# Patient Record
Sex: Male | Born: 1960 | Race: White | Hispanic: No | Marital: Married | Smoking: Current every day smoker
Health system: Southern US, Community
[De-identification: ages and names within clinical notes are randomized; demographics above are authoritative.]

## PROBLEM LIST (undated history)

## (undated) DIAGNOSIS — C3491 Malignant neoplasm of unspecified part of right bronchus or lung: Secondary | ICD-10-CM

## (undated) DIAGNOSIS — C7951 Secondary malignant neoplasm of bone: Secondary | ICD-10-CM

## (undated) DIAGNOSIS — C7931 Secondary malignant neoplasm of brain: Secondary | ICD-10-CM

## (undated) DIAGNOSIS — K529 Noninfective gastroenteritis and colitis, unspecified: Secondary | ICD-10-CM

## (undated) DIAGNOSIS — K7460 Unspecified cirrhosis of liver: Secondary | ICD-10-CM

## (undated) HISTORY — DX: Noninfective gastroenteritis and colitis, unspecified: K52.9

## (undated) HISTORY — PX: OTHER SURGICAL HISTORY: SHX169

## (undated) HISTORY — DX: Secondary malignant neoplasm of brain: C79.31

## (undated) HISTORY — DX: Secondary malignant neoplasm of bone: C79.51

## (undated) HISTORY — DX: Malignant neoplasm of unspecified part of right bronchus or lung: C34.91

## (undated) HISTORY — DX: Unspecified cirrhosis of liver: K74.60

---

## 2009-12-12 ENCOUNTER — Ambulatory Visit (HOSPITAL_COMMUNITY): Admission: RE | Admit: 2009-12-12 | Discharge: 2009-12-12 | Payer: Self-pay | Admitting: Internal Medicine

## 2014-11-19 ENCOUNTER — Other Ambulatory Visit (HOSPITAL_COMMUNITY): Payer: Self-pay | Admitting: Oncology

## 2014-11-19 DIAGNOSIS — R634 Abnormal weight loss: Secondary | ICD-10-CM | POA: Insufficient documentation

## 2014-11-19 DIAGNOSIS — Z8781 Personal history of (healed) traumatic fracture: Secondary | ICD-10-CM | POA: Insufficient documentation

## 2014-11-19 DIAGNOSIS — F17210 Nicotine dependence, cigarettes, uncomplicated: Secondary | ICD-10-CM | POA: Insufficient documentation

## 2014-11-19 DIAGNOSIS — R918 Other nonspecific abnormal finding of lung field: Secondary | ICD-10-CM | POA: Insufficient documentation

## 2014-11-29 ENCOUNTER — Encounter (HOSPITAL_COMMUNITY)
Admission: RE | Admit: 2014-11-29 | Discharge: 2014-11-29 | Disposition: A | Discharge status: Home / Self Care | Payer: BLUE CROSS/BLUE SHIELD | Source: Ambulatory Visit | Attending: Oncology | Admitting: Oncology

## 2014-11-29 DIAGNOSIS — R634 Abnormal weight loss: Secondary | ICD-10-CM | POA: Insufficient documentation

## 2014-11-29 DIAGNOSIS — R918 Other nonspecific abnormal finding of lung field: Secondary | ICD-10-CM | POA: Insufficient documentation

## 2014-11-29 LAB — GLUCOSE, CAPILLARY: Glucose-Capillary: 98 mg/dL (ref 65–99)

## 2014-11-29 MED ORDER — FLUDEOXYGLUCOSE F - 18 (FDG) INJECTION
11.0000 | Freq: Once | INTRAVENOUS | Status: DC | PRN
  Administered 2014-11-29: 11 via INTRAVENOUS
  Filled 2014-11-29: qty 11

## 2014-12-03 DIAGNOSIS — C3431 Malignant neoplasm of lower lobe, right bronchus or lung: Secondary | ICD-10-CM | POA: Insufficient documentation

## 2014-12-31 ENCOUNTER — Other Ambulatory Visit (HOSPITAL_COMMUNITY): Payer: Self-pay | Admitting: Oncology

## 2014-12-31 DIAGNOSIS — C3431 Malignant neoplasm of lower lobe, right bronchus or lung: Secondary | ICD-10-CM

## 2015-02-11 ENCOUNTER — Encounter (HOSPITAL_COMMUNITY)
Admission: RE | Admit: 2015-02-11 | Discharge: 2015-02-11 | Disposition: A | Discharge status: Home / Self Care | Payer: BLUE CROSS/BLUE SHIELD | Source: Ambulatory Visit | Attending: Oncology | Admitting: Oncology

## 2015-02-11 DIAGNOSIS — C3431 Malignant neoplasm of lower lobe, right bronchus or lung: Secondary | ICD-10-CM | POA: Diagnosis not present

## 2015-02-11 LAB — GLUCOSE, CAPILLARY: GLUCOSE-CAPILLARY: 100 mg/dL — AB (ref 65–99)

## 2015-02-11 MED ORDER — FLUDEOXYGLUCOSE F - 18 (FDG) INJECTION
10.9100 | Freq: Once | INTRAVENOUS | Status: AC | PRN
  Administered 2015-02-11: 10.91 via INTRAVENOUS

## 2015-02-12 DIAGNOSIS — Z5111 Encounter for antineoplastic chemotherapy: Secondary | ICD-10-CM | POA: Insufficient documentation

## 2015-03-12 ENCOUNTER — Other Ambulatory Visit (HOSPITAL_COMMUNITY): Payer: Self-pay | Admitting: Oncology

## 2015-03-12 DIAGNOSIS — C3431 Malignant neoplasm of lower lobe, right bronchus or lung: Secondary | ICD-10-CM

## 2015-04-05 ENCOUNTER — Encounter (HOSPITAL_COMMUNITY)
Admission: RE | Admit: 2015-04-05 | Discharge: 2015-04-05 | Disposition: A | Discharge status: Home / Self Care | Payer: BLUE CROSS/BLUE SHIELD | Source: Ambulatory Visit | Attending: Oncology | Admitting: Oncology

## 2015-04-05 DIAGNOSIS — C3431 Malignant neoplasm of lower lobe, right bronchus or lung: Secondary | ICD-10-CM | POA: Insufficient documentation

## 2015-04-05 LAB — GLUCOSE, CAPILLARY: Glucose-Capillary: 94 mg/dL (ref 65–99)

## 2015-04-05 MED ORDER — FLUDEOXYGLUCOSE F - 18 (FDG) INJECTION
11.5000 | Freq: Once | INTRAVENOUS | Status: AC | PRN
  Administered 2015-04-05: 11.5 via INTRAVENOUS

## 2015-05-21 DIAGNOSIS — J449 Chronic obstructive pulmonary disease, unspecified: Secondary | ICD-10-CM | POA: Insufficient documentation

## 2015-07-11 ENCOUNTER — Encounter (HOSPITAL_COMMUNITY): Payer: Self-pay | Admitting: Oncology

## 2015-07-11 ENCOUNTER — Other Ambulatory Visit (HOSPITAL_COMMUNITY): Payer: Self-pay | Admitting: Oncology

## 2015-07-11 DIAGNOSIS — C3491 Malignant neoplasm of unspecified part of right bronchus or lung: Secondary | ICD-10-CM

## 2015-07-11 HISTORY — DX: Malignant neoplasm of unspecified part of right bronchus or lung: C34.91

## 2015-08-01 ENCOUNTER — Ambulatory Visit (HOSPITAL_COMMUNITY): Payer: BLUE CROSS/BLUE SHIELD

## 2015-08-01 ENCOUNTER — Encounter (HOSPITAL_COMMUNITY): Payer: BLUE CROSS/BLUE SHIELD | Attending: Hematology & Oncology

## 2015-08-01 ENCOUNTER — Encounter (HOSPITAL_COMMUNITY): Payer: Self-pay

## 2015-08-01 VITALS — BP 145/78 | HR 95 | Temp 97.0°F | Resp 20 | Ht 68.0 in | Wt 251.0 lb

## 2015-08-01 DIAGNOSIS — C3491 Malignant neoplasm of unspecified part of right bronchus or lung: Secondary | ICD-10-CM

## 2015-08-01 DIAGNOSIS — Z5111 Encounter for antineoplastic chemotherapy: Secondary | ICD-10-CM | POA: Diagnosis not present

## 2015-08-01 LAB — CBC WITH DIFFERENTIAL/PLATELET
Basophils Absolute: 0 10*3/uL (ref 0.0–0.1)
Basophils Relative: 0 %
EOS PCT: 0 %
Eosinophils Absolute: 0 10*3/uL (ref 0.0–0.7)
HEMATOCRIT: 42.5 % (ref 39.0–52.0)
HEMOGLOBIN: 14.9 g/dL (ref 13.0–17.0)
LYMPHS ABS: 1.2 10*3/uL (ref 0.7–4.0)
LYMPHS PCT: 15 %
MCH: 35.6 pg — AB (ref 26.0–34.0)
MCHC: 35.1 g/dL (ref 30.0–36.0)
MCV: 101.7 fL — AB (ref 78.0–100.0)
MONO ABS: 0.3 10*3/uL (ref 0.1–1.0)
MONOS PCT: 3 %
NEUTROS ABS: 6.8 10*3/uL (ref 1.7–7.7)
Neutrophils Relative %: 82 %
Platelets: 480 10*3/uL — ABNORMAL HIGH (ref 150–400)
RBC: 4.18 MIL/uL — ABNORMAL LOW (ref 4.22–5.81)
RDW: 13 % (ref 11.5–15.5)
WBC: 8.3 10*3/uL (ref 4.0–10.5)

## 2015-08-01 LAB — COMPREHENSIVE METABOLIC PANEL
ALBUMIN: 3.9 g/dL (ref 3.5–5.0)
ALK PHOS: 72 U/L (ref 38–126)
ALT: 19 U/L (ref 17–63)
ANION GAP: 10 (ref 5–15)
AST: 22 U/L (ref 15–41)
BILIRUBIN TOTAL: 0.6 mg/dL (ref 0.3–1.2)
BUN: 9 mg/dL (ref 6–20)
CALCIUM: 9.5 mg/dL (ref 8.9–10.3)
CO2: 27 mmol/L (ref 22–32)
Chloride: 101 mmol/L (ref 101–111)
Creatinine, Ser: 0.9 mg/dL (ref 0.61–1.24)
GLUCOSE: 147 mg/dL — AB (ref 65–99)
Potassium: 4.4 mmol/L (ref 3.5–5.1)
Sodium: 138 mmol/L (ref 135–145)
TOTAL PROTEIN: 8.2 g/dL — AB (ref 6.5–8.1)

## 2015-08-01 MED ORDER — SODIUM CHLORIDE 0.9% FLUSH
10.0000 mL | INTRAVENOUS | Status: DC | PRN
Start: 2015-08-01 — End: 2015-08-01

## 2015-08-01 MED ORDER — SODIUM CHLORIDE 0.9 % IV SOLN
500.0000 mg/m2 | Freq: Once | INTRAVENOUS | Status: AC
  Administered 2015-08-01: 1100 mg via INTRAVENOUS
  Filled 2015-08-01: qty 40

## 2015-08-01 MED ORDER — SODIUM CHLORIDE 0.9 % IV SOLN
Freq: Once | INTRAVENOUS | Status: AC
  Administered 2015-08-01: 15:00:00 via INTRAVENOUS
  Filled 2015-08-01: qty 4

## 2015-08-01 MED ORDER — SODIUM CHLORIDE 0.9 % IV SOLN
Freq: Once | INTRAVENOUS | Status: AC
  Administered 2015-08-01: 15:00:00 via INTRAVENOUS

## 2015-08-01 MED ORDER — HEPARIN SOD (PORK) LOCK FLUSH 100 UNIT/ML IV SOLN
500.0000 [IU] | Freq: Once | INTRAVENOUS | Status: AC | PRN
  Administered 2015-08-01: 500 [IU]
  Filled 2015-08-01: qty 5

## 2015-08-01 MED ORDER — CYANOCOBALAMIN 1000 MCG/ML IJ SOLN
1000.0000 ug | Freq: Once | INTRAMUSCULAR | Status: AC
  Administered 2015-08-01: 1000 ug via INTRAMUSCULAR
  Filled 2015-08-01: qty 1

## 2015-08-01 NOTE — Patient Instructions (Signed)
[facility] Discharge Instructions for Patients Receiving Chemotherapy   Beginning January 23rd 2017 lab work for the [facility] will be done in the  Main lab at [facility] on 1st floor. If you have a lab appointment with the [facility] please come in thru the  Main Entrance and check in at the main information desk   Today you received the following chemotherapy agents:  Alimta  If you develop nausea and vomiting, or diarrhea that is not controlled by your medication, call the clinic.  The clinic phone number is [phone]. Office hours are Monday-Friday 8:30am-5:00pm.  BELOW ARE SYMPTOMS THAT SHOULD BE REPORTED IMMEDIATELY:  *FEVER GREATER THAN 101.0 F  *CHILLS WITH OR WITHOUT FEVER  NAUSEA AND VOMITING THAT IS NOT CONTROLLED WITH YOUR NAUSEA MEDICATION  *UNUSUAL SHORTNESS OF BREATH  *UNUSUAL BRUISING OR BLEEDING  TENDERNESS IN MOUTH AND THROAT WITH OR WITHOUT PRESENCE OF ULCERS  *URINARY PROBLEMS  *BOWEL PROBLEMS  UNUSUAL RASH Items with * indicate a potential emergency and should be followed up as soon as possible. If you have an emergency after office hours please contact your primary care physician or go to the nearest emergency department.  Please call the clinic during office hours if you have any questions or concerns.   You may also contact the Patient Navigator at [phone] should you have any questions or need assistance in obtaining follow up care.      Resources For Cancer Patients and their Caregivers ? American Cancer Society: Can assist with transportation, wigs, general needs, runs Look Good Feel Better.        ([phone] ? Cancer Care: Provides financial assistance, online support groups, medication/co-pay assistance.  1-800-813-HOPE 832 048 6022) ? [place] Assists [place] cancer patients and their families through emotional , educational and financial support.   931-877-7160 ? [place] DSS Where to apply for food stamps, Medicaid and utility assistance. 970-744-0568 ? RCATS: Transportation to medical appointments. 973 886 1702 ? Social Security Administration: May apply for disability if have a Stage IV cancer. [phone] ? [employer], Disability and Transit Services: Assists with nutrition, care and transit needs. [phone]

## 2015-08-01 NOTE — Progress Notes (Signed)
Tolerated tx w/o adverse reaction.  A&Ox4, in no distress.  VSS.  Discharged ambulatory.

## 2015-08-14 ENCOUNTER — Encounter (HOSPITAL_COMMUNITY): Payer: Self-pay | Admitting: Oncology

## 2015-08-14 ENCOUNTER — Encounter (HOSPITAL_COMMUNITY)
Admission: RE | Admit: 2015-08-14 | Discharge: 2015-08-14 | Disposition: A | Discharge status: Home / Self Care | Payer: BLUE CROSS/BLUE SHIELD | Source: Ambulatory Visit | Attending: Oncology | Admitting: Oncology

## 2015-08-14 DIAGNOSIS — K7460 Unspecified cirrhosis of liver: Secondary | ICD-10-CM

## 2015-08-14 DIAGNOSIS — C3491 Malignant neoplasm of unspecified part of right bronchus or lung: Secondary | ICD-10-CM | POA: Diagnosis not present

## 2015-08-14 HISTORY — DX: Unspecified cirrhosis of liver: K74.60

## 2015-08-14 LAB — GLUCOSE, CAPILLARY: GLUCOSE-CAPILLARY: 112 mg/dL — AB (ref 65–99)

## 2015-08-14 MED ORDER — FLUDEOXYGLUCOSE F - 18 (FDG) INJECTION
12.5700 | Freq: Once | INTRAVENOUS | Status: AC | PRN
  Administered 2015-08-14: 12.57 via INTRAVENOUS

## 2015-08-22 ENCOUNTER — Encounter (HOSPITAL_BASED_OUTPATIENT_CLINIC_OR_DEPARTMENT_OTHER): Payer: BLUE CROSS/BLUE SHIELD

## 2015-08-22 ENCOUNTER — Ambulatory Visit (HOSPITAL_COMMUNITY): Payer: BLUE CROSS/BLUE SHIELD

## 2015-08-22 ENCOUNTER — Encounter (HOSPITAL_COMMUNITY): Payer: Self-pay | Admitting: Oncology

## 2015-08-22 ENCOUNTER — Encounter (HOSPITAL_BASED_OUTPATIENT_CLINIC_OR_DEPARTMENT_OTHER): Payer: BLUE CROSS/BLUE SHIELD | Admitting: Oncology

## 2015-08-22 ENCOUNTER — Ambulatory Visit (HOSPITAL_COMMUNITY): Payer: BLUE CROSS/BLUE SHIELD | Admitting: Hematology & Oncology

## 2015-08-22 VITALS — BP 127/94 | HR 101 | Temp 97.6°F | Resp 18 | Wt 250.8 lb

## 2015-08-22 DIAGNOSIS — R42 Dizziness and giddiness: Secondary | ICD-10-CM

## 2015-08-22 DIAGNOSIS — C3491 Malignant neoplasm of unspecified part of right bronchus or lung: Secondary | ICD-10-CM | POA: Diagnosis not present

## 2015-08-22 DIAGNOSIS — Z7189 Other specified counseling: Secondary | ICD-10-CM

## 2015-08-22 DIAGNOSIS — Z720 Tobacco use: Secondary | ICD-10-CM

## 2015-08-22 DIAGNOSIS — C7951 Secondary malignant neoplasm of bone: Secondary | ICD-10-CM | POA: Diagnosis not present

## 2015-08-22 LAB — COMPREHENSIVE METABOLIC PANEL
ALBUMIN: 3.8 g/dL (ref 3.5–5.0)
ALK PHOS: 75 U/L (ref 38–126)
ALT: 18 U/L (ref 17–63)
AST: 20 U/L (ref 15–41)
Anion gap: 12 (ref 5–15)
BUN: 11 mg/dL (ref 6–20)
CALCIUM: 9.4 mg/dL (ref 8.9–10.3)
CHLORIDE: 100 mmol/L — AB (ref 101–111)
CO2: 24 mmol/L (ref 22–32)
CREATININE: 1.07 mg/dL (ref 0.61–1.24)
GFR calc Af Amer: >60 mL/min (ref >60)
GFR calc non Af Amer: >60 mL/min (ref >60)
GLUCOSE: 191 mg/dL — AB (ref 65–99)
Potassium: 3.7 mmol/L (ref 3.5–5.1)
SODIUM: 136 mmol/L (ref 135–145)
Total Bilirubin: 0.4 mg/dL (ref 0.3–1.2)
Total Protein: 8.1 g/dL (ref 6.5–8.1)

## 2015-08-22 LAB — CBC WITH DIFFERENTIAL/PLATELET
BASOS ABS: 0 10*3/uL (ref 0.0–0.1)
BASOS PCT: 0 %
EOS ABS: 0 10*3/uL (ref 0.0–0.7)
Eosinophils Relative: 0 %
HCT: 43.2 % (ref 39.0–52.0)
HEMOGLOBIN: 14.4 g/dL (ref 13.0–17.0)
LYMPHS ABS: 2.3 10*3/uL (ref 0.7–4.0)
Lymphocytes Relative: 13 %
MCH: 33.1 pg (ref 26.0–34.0)
MCHC: 33.3 g/dL (ref 30.0–36.0)
MCV: 99.3 fL (ref 78.0–100.0)
Monocytes Absolute: 1.1 10*3/uL — ABNORMAL HIGH (ref 0.1–1.0)
Monocytes Relative: 7 %
NEUTROS PCT: 80 %
Neutro Abs: 13.9 10*3/uL — ABNORMAL HIGH (ref 1.7–7.7)
Platelets: 588 10*3/uL — ABNORMAL HIGH (ref 150–400)
RBC: 4.35 MIL/uL (ref 4.22–5.81)
RDW: 13.1 % (ref 11.5–15.5)
WBC: 17.3 10*3/uL — AB (ref 4.0–10.5)

## 2015-08-22 MED ORDER — SODIUM CHLORIDE 0.9% FLUSH
10.0000 mL | INTRAVENOUS | Status: DC | PRN
  Administered 2015-08-22: 10 mL via INTRAVENOUS
  Filled 2015-08-22: qty 10

## 2015-08-22 MED ORDER — HEPARIN SOD (PORK) LOCK FLUSH 100 UNIT/ML IV SOLN
INTRAVENOUS | Status: AC
  Filled 2015-08-22: qty 5

## 2015-08-22 MED ORDER — HEPARIN SOD (PORK) LOCK FLUSH 100 UNIT/ML IV SOLN
500.0000 [IU] | Freq: Once | INTRAVENOUS | Status: AC
  Administered 2015-08-22: 500 [IU] via INTRAVENOUS

## 2015-08-22 NOTE — Patient Instructions (Addendum)
[place] (nivolumab) for the treatment of lung cancer: is a prescription medicine used to treat a type of advanced stage lung cancer (non-small cell lung cancer) that has spread or grown and you have tried chemotherapy that contains platinum, and it did not work or is no longer working.  This takes 1 hour to infuse. This will be given every 2 weeks.  The most common side effects of OPDIVO in people with non-small cell lung cancer include: feeling tired; pain in muscles, bones, and joints; decreased appetite; cough; constipation; shortness of breath; and nausea.     EDUCATIONAL MATERIALS GIVEN AND REVIEWED: Chemotherapy and you book, opdivo information     SELF CARE ACTIVITIES WHILE ON CHEMOTHERAPY:   Increase your fluid intake 48 hours prior to treatment and drink at least 2 quarts (64 oz of water/decaff beverages) per day after treatment. No alcohol intake. No aspirin or other medications unless approved by your oncologist. Eat foods that are light and easy to digest. Eat foods at cold or room temperature (as long as you aren't on the drug Oxaliplatin). No fried, fatty, or spicy foods immediately before or after treatment. Have teeth cleaned professionally before starting treatment. Keep dentures and partial plates clean. Use soft toothbrush and do not use mouthwashes that contain alcohol. Biotene is a good mouthwash that is available at most pharmacies or may be ordered by calling [phone]. Use warm salt water gargles (1 teaspoon salt per 1 quart warm water) before and after meals and at bedtime. Or you may rinse with 2 tablespoons of three-percent hydrogen peroxide mixed in eight ounces of water. Always use sunscreen that has not expired and with SPF (Sun Protection Factor) of 50 or higher. Wear hats to protect your head from the sun. Remember to use sunscreen on your hands, ears, face, & feet. Use your nausea medication as  directed to prevent nausea. Use your stool softener or laxative as directed to prevent constipation. Use your anti-diarrheal medication as directed to stop diarrhea.   Please wash your hands for at least 30 seconds using warm soapy water. Handwashing is the #1 way to prevent the spread of germs. Stay away from sick people or people who are getting over a cold. If you develop respiratory systems such as green/yellow mucus production or productive cough or persistent cough let us know and we will see if you need an antibiotic. It is a good idea to keep a pair of gloves on when going into grocery stores/[employer] to decrease your risk of coming into contact with germs on the carts, etc. Carry alcohol hand gel with you at all times and use it frequently if out in public. All foods need to be cooked thoroughly. No raw foods. No medium or undercooked meats, eggs. If your food is cooked medium well, it does not need to be hot pink or saturated with bloody liquid at all. Vegetables and fruits need to be washed/rinsed under the faucet with a dish detergent before being consumed. You can eat raw fruits and vegetables unless we tell you otherwise but it would be best if you cooked them or bought frozen. Do not eat off of salad bars or hot bars unless you really trust the cleanliness of the restaurant. If you need dental work, please let Dr. Whitney Muse know before you go for your appointment so that we can coordinate the best possible time for you in regards to your chemo regimen.  You need to also let your dentist know that you are actively taking chemo. We may need to do labs prior to your dental appointment. We also want your bowels moving at least every other day. If this is not happening, we need to know so that we can get you on a bowel regimen to help you go. If you are going to have sex, a condom must be used to protect the person that isn't taking chemotherapy. Chemo can decrease your libido (sex  drive).     MEDICATIONS: You have been given prescriptions for the following medications:  EMLA cream. Apply a quarter size amount to port site 1 hour prior to chemo. Do not rub in. Cover with plastic wrap.  Prednisone: 20 mg tablets this is giving for symptom management of your diarrhea if you have it.  If you have diarrhea please take this and call us.   Over-the-Counter Meds:  Miralax 1 capful in 8 oz of fluid daily. May increase to two times a day if needed. This is a stool softener. If this doesn't work proceed you can add:  Senokot S  - start with 1 tablet two times a day and increase to 4 tablets two times a day if needed. (total of 8 tablets in a 24 hour period). This is a stimulant laxative.   Call us if this does not help your bowels move.   Imodium '2mg'$  capsule. Take 2 capsules after the 1st loose stool and then 1 capsule every 2 hours until you go a total of 12 hours without having a loose stool. Call the [facility] if loose stools continue. If diarrhea occurs @ bedtime, take 2 capsules @ bedtime. Then take 2 capsules every 4 hours until morning. Call [facility].     SYMPTOMS TO REPORT AS SOON AS POSSIBLE AFTER TREATMENT:  FEVER GREATER THAN 100.5 F  CHILLS WITH OR WITHOUT FEVER  NAUSEA AND VOMITING THAT IS NOT CONTROLLED WITH YOUR NAUSEA MEDICATION  UNUSUAL SHORTNESS OF BREATH  UNUSUAL BRUISING OR BLEEDING  TENDERNESS IN MOUTH AND THROAT WITH OR WITHOUT PRESENCE OF ULCERS  URINARY PROBLEMS  BOWEL PROBLEMS  UNUSUAL RASH    Wear comfortable clothing and clothing appropriate for easy access to any Portacath or PICC line. Let us know if there is anything that we can do to make your therapy better!      I have been informed and understand all of the instructions given to me and have received a copy. I have been instructed to call the clinic [phone]  or my family physician as soon as possible for continued medical care, if indicated. I do not have any  more questions at this time but understand that I may call the [facility] at [phone] during office hours should I have questions or need assistance in obtaining follow-up care.      Nivolumab injection What is this medicine? NIVOLUMAB (nye VOL ue mab) is a monoclonal antibody. It is used to treat melanoma, lung cancer, kidney cancer, and Hodgkin lymphoma. This medicine may be used for other purposes; ask your health care provider or pharmacist if you have questions. What should I tell my health care provider before I take this medicine? They need to know if you have any of these conditions: -diabetes -immune system problems -kidney disease -liver disease -lung disease -organ transplant -stomach or intestine problems -thyroid disease -an unusual or allergic reaction to nivolumab, other medicines, foods, dyes, or preservatives -pregnant or trying to get  pregnant -breast-feeding How should I use this medicine? This medicine is for infusion into a vein. It is given by a health care professional in a hospital or clinic setting. A special MedGuide will be given to you before each treatment. Be sure to read this information carefully each time. Talk to your pediatrician regarding the use of this medicine in children. Special care may be needed. Overdosage: If you think you have taken too much of this medicine contact a poison control center or emergency room at once. NOTE: This medicine is only for you. Do not share this medicine with others. What if I miss a dose? It is important not to miss your dose. Call your doctor or health care professional if you are unable to keep an appointment. What may interact with this medicine? Interactions have not been studied. Give your health care provider a list of all the medicines, herbs, non-prescription drugs, or dietary supplements you use. Also tell them if you smoke, drink alcohol, or use illegal drugs. Some items may interact with your  medicine. This list may not describe all possible interactions. Give your health care provider a list of all the medicines, herbs, non-prescription drugs, or dietary supplements you use. Also tell them if you smoke, drink alcohol, or use illegal drugs. Some items may interact with your medicine. What should I watch for while using this medicine? This drug may make you feel generally unwell. Continue your course of treatment even though you feel ill unless your doctor tells you to stop. You may need blood work done while you are taking this medicine. Do not become pregnant while taking this medicine or for 5 months after stopping it. Women should inform their doctor if they wish to become pregnant or think they might be pregnant. There is a potential for serious side effects to an unborn child. Talk to your health care professional or pharmacist for more information. Do not breast-feed an infant while taking this medicine. What side effects may I notice from receiving this medicine? Side effects that you should report to your doctor or health care professional as soon as possible: -allergic reactions like skin rash, itching or hives, swelling of the face, lips, or tongue -black, tarry stools -blood in the urine -bloody or watery diarrhea -changes in vision -change in sex drive -changes in emotions or moods -chest pain -confusion -cough -decreased appetite -diarrhea -facial flushing -feeling faint or lightheaded -fever, chills -hair loss -hallucination, loss of contact with reality -headache -irritable -joint pain -loss of memory -muscle pain -muscle weakness -seizures -shortness of breath -signs and symptoms of high blood sugar such as dizziness; dry mouth; dry skin; fruity breath; nausea; stomach pain; increased hunger or thirst; increased urination -signs and symptoms of kidney injury like trouble passing urine or change in the amount of urine -signs and symptoms of liver injury  like dark yellow or brown urine; general ill feeling or flu-like symptoms; light-colored stools; loss of appetite; nausea; right upper belly pain; unusually weak or tired; yellowing of the eyes or skin -stiff neck -swelling of the ankles, feet, hands -weight gain Side effects that usually do not require medical attention (report to your doctor or health care professional if they continue or are bothersome): -bone pain -constipation -tiredness -vomiting This list may not describe all possible side effects. Call your doctor for medical advice about side effects. You may report side effects to FDA at 1-800-FDA-1088. Where should I keep my medicine? This drug is given in a hospital  or clinic and will not be stored at home. NOTE: This sheet is a summary. It may not cover all possible information. If you have questions about this medicine, talk to your doctor, pharmacist, or health care provider.    2016, Elsevier/Gold Standard. (2014-06-13 10:03:42)

## 2015-08-22 NOTE — Patient Instructions (Addendum)
[place] at [facility] Discharge Instructions  RECOMMENDATIONS MADE BY THE CONSULTANT AND ANY TEST RESULTS WILL BE SENT TO YOUR REFERRING PHYSICIAN.  You saw Kirby Crigler PA-C and Dr. Whitney Muse today. Your cancer has gotten worse unfortunately.  Therefore, we will need to switch therapy.  We will change to Nivolumab (Opdivo) which is one of the newer immunotherapy. We will need to get you set-up for an MRI of brain within the next week or so. You will need to undergo chemotherapy teaching for this new medication. Return for treatment as directed.  Thank you for choosing [facility] at [facility] to provide your oncology and hematology care.  To afford each patient quality time with our provider, please arrive at least 15 minutes before your scheduled appointment time.   Beginning January 23rd 2017 lab work for the [facility] will be done in the  Main lab at [facility] on 1st floor. If you have a lab appointment with the [facility] please come in thru the  Main Entrance and check in at the main information desk  You need to re-schedule your appointment should you arrive 10 or more minutes late.  We strive to give you quality time with our providers, and arriving late affects you and other patients whose appointments are after yours.  Also, if you no show three or more times for appointments you may be dismissed from the clinic at the providers discretion.     Again, thank you for choosing [facility].  Our hope is that these requests will decrease the amount of time that you wait before being seen by our physicians.       _____________________________________________________________  Should you have questions after your visit to [facility], please contact our office at [phone] between the hours of 8:30 a.m. and 4:30 p.m.  Voicemails left after 4:30 p.m. will not be returned until the following business day.   For prescription refill requests, have your pharmacy contact our office.         Resources For Cancer Patients and their Caregivers ? American Cancer Society: Can assist with transportation, wigs, general needs, runs Look Good Feel Better.        9200396615 ? Cancer Care: Provides financial assistance, online support groups, medication/co-pay assistance.  1-800-813-HOPE 267-015-8634) ? [place] Assists [place] cancer patients and their families through emotional , educational and financial support.  252-411-1817 ? [place] DSS Where to apply for food stamps, Medicaid and utility assistance. ([phone] 775-7804 ? RCATS: Transportation to medical appointments. 225-007-4890 ? Social Security Administration: May apply for disability if have a Stage IV cancer. 269-853-2615 [phone] ? [employer], Disability and Transit Services: Assists with nutrition, care and transit needs. [place] Support Programs: '@10RELATIVEDAYS'$ @ > Cancer Support Group  2nd Tuesday of the month 1pm-2pm, Journey Room  > Creative Journey  3rd Tuesday of the month 1130am-1pm, Journey Room  > Look Good Feel Better  1st Wednesday of the month 10am-12 noon, Journey Room (Call [facility] to register [phone])

## 2015-08-22 NOTE — Progress Notes (Signed)
Carl Simmons presented for Portacath access and flush. Proper placement of portacath confirmed by CXR. Portacath located right chest wall accessed with  H 20 needle. No blood return Portacath flushed with 96m NS and 500U/567mHeparin and needle removed intact. Procedure without incident. Patient tolerated procedure well.

## 2015-08-23 ENCOUNTER — Other Ambulatory Visit (HOSPITAL_COMMUNITY): Payer: Self-pay | Admitting: Emergency Medicine

## 2015-08-23 DIAGNOSIS — C3491 Malignant neoplasm of unspecified part of right bronchus or lung: Secondary | ICD-10-CM

## 2015-08-23 MED ORDER — PREDNISONE 20 MG PO TABS: ORAL_TABLET | ORAL | 0 refills | Status: DC

## 2015-08-23 NOTE — Progress Notes (Unsigned)
Called to introduce myself and to let the pt know that I called in the steroid that would be for symptom management after chemotherapy started.  I told them that I would have all this information for them on their first day of chemotherapy.  I gave them my direct number to reach me.  They were very appreciative.

## 2015-08-24 ENCOUNTER — Encounter (HOSPITAL_COMMUNITY): Payer: Self-pay | Admitting: Oncology

## 2015-08-24 DIAGNOSIS — C7951 Secondary malignant neoplasm of bone: Secondary | ICD-10-CM

## 2015-08-24 HISTORY — DX: Secondary malignant neoplasm of bone: C79.51

## 2015-08-24 NOTE — Assessment & Plan Note (Signed)
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  Treated by Dr. Tressie Stalker at [facility] with Carboplatin/Pemetrexed every 21 days x 6 cycles (12/10/2014- 03/25/2015) with transition to maintenance Pemetrexed every 21 days.  Oncology history is developed.  Staging in [facility] problem list is completed.  I personally reviewed and went over laboratory results with the patient.  The results are noted within this dictation.  I personally reviewed and went over radiographic studies with the patient.  The results are noted within this dictation.   PET imaging report and images are reviewed with the patient and family.  We broached the topic of goals of care and this conversation will continue moving forward, along with code status.  His performance status is excellent and therefore, he is a good candidate for second line therapy.    We reviewed the NCCN guidelines with the patient and family regarding second line options.  He is PDL1 negative, and therefore, he is a candidate for Nivolumab every 2 weeks.  We reviewed the risks, benefits, alternatives, and side effects of Nivolumab therapy.  He will need chemotherapy teaching for Nivolumab.  Message is sent to Shellia Carwin, nurse navigator.  Message is sent to prior authorization specialist as well for approval for Nivolumab Lendell Caprice).  Pemetrexed treatment today is cancelled.  Treatment plan is dicontinued/deleted.  Pre-treatment labs: CBC diff, CMET.  TSH every month.  Given dizziness, order is placed for MRI brain w and wo contrast to evaluate for intracranial metastatic disease.  He will discontinue his steroid, dexamethasone.  This is deleted from medication list.  Given his new osseous involvement, we will plan on adding Xgeva to his oncology care.  Supportive therapy plan is built.  Will plan on starting treatment next week.  Return in 1-3 weeks for follow-up.

## 2015-08-24 NOTE — Progress Notes (Signed)
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    Adenocarcinoma of right lung ([place])   11/20/2014 Procedure    Right scalp skin biopsy      11/20/2014 Pathology Results    Adenocarcinoma of lung primary. PDL-1 NEGATIVE (0% tumor proportion score); EGFR mutation NEGATIVE; no evidence of ALK gene rearrangement detected by FISH; no reaarnagement of ROS1 gene detected.      11/29/2014 PET scan    Large hypermetabolic mass within the RLL with central necrosis. Hypermetabolic right suprahilar mass with postobstructive collapse of the RUL. Subcarinal, paratracheal, prevascular and R internal mammary nodal metastases. Small R adrenal gland nodule.      12/10/2014 - 03/25/2015 Chemotherapy    Carboplatin and pemetrexed 6 cycles      02/11/2015 PET scan    Partial metabolic response to therapy, with decrease in hypermetabolic right lung masses, postobstructive right upper lobe consolidation, thoracic lymphadenopathy and right adrenal metastasis. No new or progressive disease identified.      04/05/2015 PET scan    No significant change in hypermetabolic right lung masses and thoracic lymphadenopathy. No new or progressive disease identified.      04/15/2015 - 08/01/2015 Chemotherapy    Pemetrexed maintenance      08/14/2015 PET scan    Overall progression of metastatic lung cancer. Increasingly hypermetabolic mediastinal lymph nodes and R lung masses, enlarging R adrenal mass and new osseous lesions. Enlarging hypermetabolic R-sided scalp nodules.      08/14/2015 Progression    Progression of disease on PET imaging      08/29/2015 -  Chemotherapy    Opdivo      08/29/2015 Miscellaneous    Xgeva started  monthly      09/05/2015 Imaging    MRI brain- Multiple metastatic deposits in the brain. At least 9 lesions are identified. Mild edema around the 10 mm lesion in the right cerebellum.       HISTORY OF PRESENT ILLNESS:   ANAIS KOENEN is a 55 y.o. male with a medical history significant for obesity, tobacco abuse, COPD, cirrhosis of liver who is referred to the [facility] for transfer of medical oncology care for Stage IV adenocarcinoma of right lung having been treated by Dr. Tressie Stalker in the past. Patient was started on carboplatin/alimta. He was PDL1 negative.  He was last seen by Dr. Tressie Stalker on 07/02/2015.   I personally reviewed and went over laboratory results with the patient.  The results are noted within this dictation.  I personally reviewed and went over radiographic studies with the patient.  The results are noted within this dictation.  PET imaging from 08/14/2015 demonstrates frank progression of disease. He has been on maintenance Alimta since 04/15/2015. Last PET was on 04/05/2015 prior to initiation of maintenance alimta.   I personally reviewed and went over pathology results with the patient.  Chart is reviewed.    Patient notes some onset of dizziness resolving spontaneously after approximately 5-10 seconds. Additionally, over the past 2-3 weeks, his stamina and fatigue has progressed. His wife corroborates this complaint as well. He reports a new scalp lesion that has grown on his head over the  past 2-3 weeks. In spite of these complaints his PS is still excellent.  He continues to smoke 2 packs per day. Appetite is still good. No nausea or vomiting.   Review of Systems  Constitutional: Positive for malaise/fatigue. Negative for chills, fever and weight loss.  HENT: Negative.   Eyes: Negative.  Negative for blurred vision and double vision.  Respiratory: Positive for cough, shortness of breath and wheezing. Negative for hemoptysis.   Cardiovascular:  Negative.  Negative for chest pain.  Gastrointestinal: Negative.  Negative for abdominal pain, constipation, diarrhea, nausea and vomiting.  Genitourinary: Negative.   Musculoskeletal: Negative.   Skin: Negative.  Negative for itching and rash.  Neurological: Positive for dizziness and weakness. Negative for sensory change, speech change, focal weakness, seizures, loss of consciousness and headaches.  Endo/Heme/Allergies: Negative.   Psychiatric/Behavioral: Negative.   14 point review of systems was performed and is negative except as detailed under history of present illness and above    PAST MEDICAL HISTORY:   Past Medical History:  Diagnosis Date  . Adenocarcinoma of right lung ([place]) 07/11/2015  . Bone metastasis ([place]) 08/24/2015  . Cirrhosis of liver ([place]) 08/14/2015   PET on 08/14/2015 demonstrates cirrhotic liver.     ALLERGIES: Not on File    MEDICATIONS: I have reviewed the patient's current medications.    Current Outpatient Prescriptions on File Prior to Visit  Medication Sig Dispense Refill  . cyanocobalamin (,VITAMIN B-12,) 1000 MCG/ML injection Inject 1,000 mcg into the muscle every 30 (thirty) days.    . folic acid (FOLVITE) 1 MG tablet Take 1 mg by mouth daily.     Marland Kitchen HYDROcodone-acetaminophen (NORCO/VICODIN) 5-325 MG tablet Take 1 tablet by mouth every 4 (four) hours as needed.    . lidocaine-prilocaine (EMLA) cream Apply topically as needed.    . naproxen (NAPROSYN) 500 MG tablet Take 500 mg by mouth 2 (two) times daily as needed.     No current facility-administered medications on file prior to visit.      PAST SURGICAL HISTORY History reviewed. No pertinent surgical history.  FAMILY HISTORY: Family History  Problem Relation Age of Onset  . Cancer Father     thyroid cancer  . Lupus Sister   . Cancer Sister     metastatic ovarian    SOCIAL HISTORY:  reports that he has been smoking.  He has been smoking about 2.00 packs per day. He has never used  smokeless tobacco. He reports that he does not drink alcohol or use drugs. He admits to a history of significant alcohol abuse. Quit in 1986. He used to be a Freight forwarder. He admits to being a Christian: [place] History  . Marital status: Married    Spouse name: N/A  . Number of children: N/A  . Years of education: N/A   Social History Main Topics  . Smoking status: Current Every Day Smoker    Packs/day: 2.00  . Smokeless tobacco: Never Used  . Alcohol use No  . Drug use: No  . Sexual activity: Not Asked   Other Topics Concern  . None   Social History Narrative  . None    PERFORMANCE STATUS: The patient's performance status is 1 - Symptomatic but completely ambulatory  PHYSICAL EXAM: Most Recent Vital Signs: Blood pressure (!) 127/94, pulse (!) 101, temperature 97.6 F (36.4 C), temperature source Oral, resp. rate 18, weight 250 lb 12.8 oz (113.8 kg), SpO2 100 %. General appearance: alert, cooperative,  appears older than stated age, no distress, morbidly obese and accompanied by sister and wife, very good spirits, jokster. Head: scalp lesions, largest about 2 cm behind R ear Eyes: conjunctivae/corneas clear. PERRL, EOM's intact. Fundi benign. Throat: normal findings: lips normal without lesions and oropharynx pink & moist without lesions or evidence of thrush Neck: no adenopathy, supple, symmetrical, trachea midline and thyroid not enlarged, symmetric, no tenderness/mass/nodules Lungs: rhonchi bibasilar and wheezes bilaterally Heart: regular rate and rhythm Abdomen: normal findings: no masses palpable and no organomegaly and abnormal findings:  obese Extremities: extremities normal, atraumatic, no cyanosis or edema Skin: Skin color, texture, turgor normal. No rashes or lesions Lymph nodes: Cervical, supraclavicular, and axillary nodes normal. Neurologic: Grossly normal  LABORATORY DATA:  Results for orders placed or performed in visit on  08/22/15 (from the past 48 hour(s))  CBC with Differential     Status: Abnormal   Collection Time: 08/22/15  3:11 PM  Result Value Ref Range   WBC 17.3 (H) 4.0 - 10.5 K/uL   RBC 4.35 4.22 - 5.81 MIL/uL   Hemoglobin 14.4 13.0 - 17.0 g/dL   HCT 43.2 39.0 - 52.0 %   MCV 99.3 78.0 - 100.0 fL   MCH 33.1 26.0 - 34.0 pg   MCHC 33.3 30.0 - 36.0 g/dL   RDW 13.1 11.5 - 15.5 %   Platelets 588 (H) 150 - 400 K/uL   Neutrophils Relative % 80 %   Neutro Abs 13.9 (H) 1.7 - 7.7 K/uL   Lymphocytes Relative 13 %   Lymphs Abs 2.3 0.7 - 4.0 K/uL   Monocytes Relative 7 %   Monocytes Absolute 1.1 (H) 0.1 - 1.0 K/uL   Eosinophils Relative 0 %   Eosinophils Absolute 0.0 0.0 - 0.7 K/uL   Basophils Relative 0 %   Basophils Absolute 0.0 0.0 - 0.1 K/uL  Comprehensive metabolic panel     Status: Abnormal   Collection Time: 08/22/15  3:11 PM  Result Value Ref Range   Sodium 136 135 - 145 mmol/L   Potassium 3.7 3.5 - 5.1 mmol/L   Chloride 100 (L) 101 - 111 mmol/L   CO2 24 22 - 32 mmol/L   Glucose, Bld 191 (H) 65 - 99 mg/dL   BUN 11 6 - 20 mg/dL   Creatinine, Ser 1.07 0.61 - 1.24 mg/dL   Calcium 9.4 8.9 - 10.3 mg/dL   Total Protein 8.1 6.5 - 8.1 g/dL   Albumin 3.8 3.5 - 5.0 g/dL   AST 20 15 - 41 U/L   ALT 18 17 - 63 U/L   Alkaline Phosphatase 75 38 - 126 U/L   Total Bilirubin 0.4 0.3 - 1.2 mg/dL   GFR calc non Af Amer >60 >60 mL/min   GFR calc Af Amer >60 >60 mL/min    Comment: (NOTE) The eGFR has been calculated using the CKD EPI equation. This calculation has not been validated in all clinical situations. eGFR's persistently <60 mL/min signify possible Chronic Kidney Disease.    Anion gap 12 5 - 15      RADIOGRAPHY: CLINICAL DATA:  Subsequent treatment strategy for right lung cancer.  EXAM: NUCLEAR MEDICINE PET SKULL BASE TO THIGH  TECHNIQUE: 12.6 mCi F-18 FDG was injected intravenously. Full-ring PET imaging was performed from the skull base to thigh after the radiotracer. CT data was  obtained and used for attenuation correction and anatomic localization.  FASTING BLOOD GLUCOSE:  Value: 112 mg/dl  COMPARISON:  04/05/2015.  FINDINGS: NECK  A  scalp nodule overlying the right parietal bone measures 1.6 cm (CT image 3) with an SUV max of 22.9, previously 8 mm. Soft tissue nodule in the right sub occipital region measures 9 mm (CT image 12) with an SUV max of 6.0, previously 6 mm. No hypermetabolic lymph nodes in the neck. CT images show no acute findings.  CHEST  Hypermetabolic low right paratracheal lymph node measures 1.8 cm, stable in size but increased in FDG avidity, with an SUV max of 11.5, previously 8.8. Subcarinal lymph node measures 1.4 cm with an SUV max of 9.9. Central right upper lobe/perihilar soft tissue mass is difficult to accurately measure without IV contrast and has an SUV max of 16.6, previously 14.1. There is postobstructive pneumonitis in the right upper lobe with a mildly hypermetabolic 12 mm nodule (CT image 35 of series 8). Dominant mass in the right lower lobe measures 6.3 x 6.4 cm, stable in size, with an SUV max of 21.4 compared to 15.6 previously. Right IJ Port-A-Cath appears coiled in the region of the internal jugular vein and terminates in the low SVC. Atherosclerotic calcification of the arterial vasculature. No pericardial effusion.  ABDOMEN/PELVIS  Hypermetabolic right adrenal mass measures 1.7 x 2.6 cm, previously 1.1 x 1.5 cm, with an SUV max of 13.7. No abnormal hypermetabolism in the liver, left adrenal gland, spleen or pancreas. There is focal hypermetabolism associated with the rectum, as before, nonspecific. Liver margin is slightly irregular. Liver and gallbladder are otherwise unremarkable. Left adrenal gland appears somewhat thickened, stable. Adrenal glands, kidneys, spleen, pancreas, stomach and bowel are grossly unremarkable. Atherosclerotic calcification of the arterial  vasculature.  SKELETON  There is new focal hypermetabolism associated with the right fifth lateral rib with possible subtle cortical loss (CT image 79, series 4). New focal hypermetabolism is also seen along the posterior margin of the left femoral neck with an SUV max is 6.3 and no definite CT correlate.  IMPRESSION: 1. Overall progression of metastatic lung cancer as evidenced by increasingly hypermetabolic mediastinal lymph nodes and right lung masses, enlarging right adrenal mass and new osseous lesions. 2. Enlarging hypermetabolic right-sided scalp nodules are worrisome for metastatic disease. 3. Focal hypermetabolism associated with the rectum, nonspecific. 4. Liver appears cirrhotic.   Electronically Signed   By: Lorin Picket M.D.   On: 08/14/2015 13:12   PATHOLOGY:  As above   ASSESSMENT/PLAN:   Adenocarcinoma of right lung, stage IV  Scalp metastases Dizziness Bone metastases Tobacco Abuse   Stage IV adenocarcinoma of right lung with metastatic disease, biopsy proven on scalp lesion excision on right parietal area.  Treated by Dr. Tressie Stalker at [facility] with Carboplatin/Pemetrexed every 21 days x 6 cycles (12/10/2014- 03/25/2015) with transition to maintenance Pemetrexed every 21 days.  Oncology history is developed.  Staging in [facility] problem list is completed.  PET imaging report and images are reviewed with the patient and family. He has clear progression of disease. Given his new osseous involvement, we will plan on adding Xgeva to his oncology care.  Supportive therapy plan is built. Given dizziness, order is placed for MRI brain w and wo contrast to evaluate for intracranial metastatic disease.  We broached the topic of goals of care and this conversation will continue moving forward, along with code status.  His performance status is excellent and therefore, he is a good candidate for second line therapy.    We reviewed the NCCN guidelines with the  patient and family regarding second line options.  He is PDL1  negative, and therefore, he is a candidate for Nivolumab every 2 weeks.  We reviewed the risks, benefits, alternatives, and side effects of Nivolumab therapy.  He will need chemotherapy teaching for Nivolumab.  Message is sent to Shellia Carwin, nurse navigator.  Message is sent to prior authorization specialist as well for approval for Nivolumab Lendell Caprice).  Pemetrexed treatment today is cancelled.  Treatment plan is dicontinued/deleted.  Pre-treatment labs: CBC diff, CMET.  TSH every month.  He will discontinue his steroid, dexamethasone.  This is deleted from medication list.  Will plan on starting treatment next week.  Return in 1-3 weeks for follow-up. I advised the patient we will notify him of results of brain MRI when available.    ORDERS PLACED FOR THIS ENCOUNTER: Orders Placed This Encounter  Procedures  . MR Brain W Wo Contrast  . CBC with Differential  . Comprehensive metabolic panel   All questions were answered. The patient knows to call the clinic with any problems, questions or concerns. We can certainly see the patient much sooner if necessary.  This note is electronically signed [email or web address] Cyril Mourning, MD  08/24/2015 11:21 AM

## 2015-08-29 ENCOUNTER — Encounter (HOSPITAL_COMMUNITY): Payer: BLUE CROSS/BLUE SHIELD | Attending: Hematology & Oncology

## 2015-08-29 VITALS — BP 104/77 | HR 97 | Temp 98.4°F | Resp 18 | Wt 248.4 lb

## 2015-08-29 DIAGNOSIS — Z5112 Encounter for antineoplastic immunotherapy: Secondary | ICD-10-CM | POA: Diagnosis not present

## 2015-08-29 DIAGNOSIS — C3491 Malignant neoplasm of unspecified part of right bronchus or lung: Secondary | ICD-10-CM

## 2015-08-29 DIAGNOSIS — C7951 Secondary malignant neoplasm of bone: Secondary | ICD-10-CM

## 2015-08-29 LAB — CBC WITH DIFFERENTIAL/PLATELET
BASOS ABS: 0.1 10*3/uL (ref 0.0–0.1)
BASOS PCT: 1 %
Eosinophils Absolute: 0.3 10*3/uL (ref 0.0–0.7)
Eosinophils Relative: 2 %
HEMATOCRIT: 40.6 % (ref 39.0–52.0)
HEMOGLOBIN: 13.5 g/dL (ref 13.0–17.0)
Lymphocytes Relative: 22 %
Lymphs Abs: 2.4 10*3/uL (ref 0.7–4.0)
MCH: 33.3 pg (ref 26.0–34.0)
MCHC: 33.3 g/dL (ref 30.0–36.0)
MCV: 100.2 fL — ABNORMAL HIGH (ref 78.0–100.0)
Monocytes Absolute: 1.2 10*3/uL — ABNORMAL HIGH (ref 0.1–1.0)
Monocytes Relative: 11 %
NEUTROS ABS: 6.9 10*3/uL (ref 1.7–7.7)
NEUTROS PCT: 64 %
Platelets: 353 10*3/uL (ref 150–400)
RBC: 4.05 MIL/uL — ABNORMAL LOW (ref 4.22–5.81)
RDW: 13 % (ref 11.5–15.5)
WBC: 10.8 10*3/uL — AB (ref 4.0–10.5)

## 2015-08-29 LAB — COMPREHENSIVE METABOLIC PANEL
ALBUMIN: 3.4 g/dL — AB (ref 3.5–5.0)
ALK PHOS: 72 U/L (ref 38–126)
ALT: 16 U/L — ABNORMAL LOW (ref 17–63)
AST: 17 U/L (ref 15–41)
Anion gap: 9 (ref 5–15)
BILIRUBIN TOTAL: 0.6 mg/dL (ref 0.3–1.2)
BUN: 13 mg/dL (ref 6–20)
CO2: 29 mmol/L (ref 22–32)
Calcium: 9.4 mg/dL (ref 8.9–10.3)
Chloride: 97 mmol/L — ABNORMAL LOW (ref 101–111)
Creatinine, Ser: 0.92 mg/dL (ref 0.61–1.24)
GFR calc Af Amer: >60 mL/min (ref >60)
GFR calc non Af Amer: >60 mL/min (ref >60)
GLUCOSE: 150 mg/dL — AB (ref 65–99)
POTASSIUM: 3 mmol/L — AB (ref 3.5–5.1)
Sodium: 135 mmol/L (ref 135–145)
TOTAL PROTEIN: 7.6 g/dL (ref 6.5–8.1)

## 2015-08-29 MED ORDER — HEPARIN SOD (PORK) LOCK FLUSH 100 UNIT/ML IV SOLN
500.0000 [IU] | Freq: Once | INTRAVENOUS | Status: AC | PRN
  Administered 2015-08-29: 500 [IU]
  Filled 2015-08-29: qty 5

## 2015-08-29 MED ORDER — SODIUM CHLORIDE 0.9 % IV SOLN
Freq: Once | INTRAVENOUS | Status: AC
  Administered 2015-08-29: 11:00:00 via INTRAVENOUS

## 2015-08-29 MED ORDER — DENOSUMAB 120 MG/1.7ML ~~LOC~~ SOLN
120.0000 mg | Freq: Once | SUBCUTANEOUS | Status: AC
  Administered 2015-08-29: 120 mg via SUBCUTANEOUS
  Filled 2015-08-29: qty 1.7

## 2015-08-29 MED ORDER — SODIUM CHLORIDE 0.9 % IV SOLN
240.0000 mg | Freq: Once | INTRAVENOUS | Status: AC
  Administered 2015-08-29: 240 mg via INTRAVENOUS
  Filled 2015-08-29: qty 4

## 2015-08-29 MED ORDER — FISH OIL 1000 MG PO CPDR: 1000.0000 mg | DELAYED_RELEASE_CAPSULE | Freq: Every day | ORAL | 1 refills | Status: DC

## 2015-08-29 MED ORDER — SODIUM CHLORIDE 0.9% FLUSH
10.0000 mL | INTRAVENOUS | Status: DC | PRN
  Administered 2015-08-29: 10 mL
  Filled 2015-08-29: qty 10

## 2015-08-29 NOTE — Progress Notes (Signed)
Education and side effects explained about opdivo.  Questions answered. Consent signed.

## 2015-08-29 NOTE — Progress Notes (Signed)
Carl Simmons presents today for injection per MD orders. Xgeva 120 mg administered SQ in left Abdomen. Administration without incident. Patient tolerated well.   Tolerated chemo well. Ambulatory on discharge home with wife.

## 2015-09-02 ENCOUNTER — Telehealth (HOSPITAL_COMMUNITY): Payer: Self-pay | Admitting: *Deleted

## 2015-09-02 NOTE — Telephone Encounter (Signed)
Contacted for 24 hr f/u post nivolumab infusion.  Left message to call the clinic with any questions or concerns.

## 2015-09-05 ENCOUNTER — Other Ambulatory Visit (HOSPITAL_COMMUNITY): Payer: Self-pay | Admitting: Oncology

## 2015-09-05 ENCOUNTER — Ambulatory Visit (HOSPITAL_COMMUNITY)
Admission: RE | Admit: 2015-09-05 | Discharge: 2015-09-05 | Disposition: A | Discharge status: Home / Self Care | Payer: BLUE CROSS/BLUE SHIELD | Source: Ambulatory Visit | Attending: Oncology | Admitting: Oncology

## 2015-09-05 DIAGNOSIS — C7931 Secondary malignant neoplasm of brain: Secondary | ICD-10-CM | POA: Diagnosis not present

## 2015-09-05 DIAGNOSIS — C3491 Malignant neoplasm of unspecified part of right bronchus or lung: Secondary | ICD-10-CM | POA: Diagnosis not present

## 2015-09-05 MED ORDER — GADOBENATE DIMEGLUMINE 529 MG/ML IV SOLN
20.0000 mL | Freq: Once | INTRAVENOUS | Status: AC | PRN
  Administered 2015-09-05: 20 mL via INTRAVENOUS

## 2015-09-06 ENCOUNTER — Encounter (HOSPITAL_COMMUNITY): Payer: Self-pay | Admitting: Emergency Medicine

## 2015-09-06 NOTE — Progress Notes (Signed)
Referral for radiation for [facility]

## 2015-09-09 ENCOUNTER — Other Ambulatory Visit: Payer: Self-pay | Admitting: Radiation Therapy

## 2015-09-09 ENCOUNTER — Encounter: Payer: Self-pay | Admitting: Radiation Therapy

## 2015-09-09 DIAGNOSIS — C7931 Secondary malignant neoplasm of brain: Secondary | ICD-10-CM

## 2015-09-09 DIAGNOSIS — C7949 Secondary malignant neoplasm of other parts of nervous system: Principal | ICD-10-CM

## 2015-09-09 NOTE — Progress Notes (Signed)
1.  Do you need a wheel chair? no   2. On oxygen? no  3. Have you ever had any surgery in the body part being scanned?  no  4. Have you ever had any surgery on your brain or heart?  no                       5. Have you ever had surgery on your eyes or ears?   no                              6. Do you have a pacemaker or defibrillator?   no  7. Do you have a Neurostimulator?    no  9. Any risk for metal in eyes?  no  10. Injury by bullet, buckshot, or shrapnel?  no  11. Stent?     no                                                                                                                            12. Hx of Cancer?    Lung with mets to the brain                                                                                                            13. Kidney or Liver disease?  no  14. Hx of Lupus, Rheumatoid Arthritis or Scleroderma?  no  15. IV Antibiotics or long term use of NSAIDS?  no  16. HX of Hypertension?  no  17. Diabetes?  no  18. Allergy to contrast?  no  19. Recent labs. Yes in EPIC   His wife answered these questions over the phone on 8/14.  Mont Dutton R.T.(R)(T) Special Procedures Navigator  Radiation Oncology

## 2015-09-12 ENCOUNTER — Encounter (HOSPITAL_BASED_OUTPATIENT_CLINIC_OR_DEPARTMENT_OTHER): Payer: BLUE CROSS/BLUE SHIELD

## 2015-09-12 ENCOUNTER — Encounter (HOSPITAL_BASED_OUTPATIENT_CLINIC_OR_DEPARTMENT_OTHER): Payer: BLUE CROSS/BLUE SHIELD | Admitting: Oncology

## 2015-09-12 ENCOUNTER — Encounter (HOSPITAL_COMMUNITY): Payer: Self-pay | Admitting: Oncology

## 2015-09-12 ENCOUNTER — Ambulatory Visit (HOSPITAL_COMMUNITY): Payer: BLUE CROSS/BLUE SHIELD | Admitting: Oncology

## 2015-09-12 VITALS — BP 118/87 | HR 94 | Temp 99.0°F | Resp 20 | Wt 242.6 lb

## 2015-09-12 DIAGNOSIS — C3491 Malignant neoplasm of unspecified part of right bronchus or lung: Secondary | ICD-10-CM | POA: Diagnosis not present

## 2015-09-12 DIAGNOSIS — C792 Secondary malignant neoplasm of skin: Secondary | ICD-10-CM

## 2015-09-12 DIAGNOSIS — Z5112 Encounter for antineoplastic immunotherapy: Secondary | ICD-10-CM

## 2015-09-12 LAB — CBC WITH DIFFERENTIAL/PLATELET
BASOS ABS: 0 10*3/uL (ref 0.0–0.1)
BASOS PCT: 0 %
Eosinophils Absolute: 0.1 10*3/uL (ref 0.0–0.7)
Eosinophils Relative: 1 %
HCT: 43.4 % (ref 39.0–52.0)
HEMOGLOBIN: 15.1 g/dL (ref 13.0–17.0)
LYMPHS PCT: 26 %
Lymphs Abs: 2.9 10*3/uL (ref 0.7–4.0)
MCH: 34 pg (ref 26.0–34.0)
MCHC: 34.8 g/dL (ref 30.0–36.0)
MCV: 97.7 fL (ref 78.0–100.0)
MONOS PCT: 9 %
Monocytes Absolute: 1 10*3/uL (ref 0.1–1.0)
NEUTROS ABS: 7.2 10*3/uL (ref 1.7–7.7)
Neutrophils Relative %: 64 %
Platelets: 504 10*3/uL — ABNORMAL HIGH (ref 150–400)
RBC: 4.44 MIL/uL (ref 4.22–5.81)
RDW: 12.4 % (ref 11.5–15.5)
WBC: 11.2 10*3/uL — ABNORMAL HIGH (ref 4.0–10.5)

## 2015-09-12 LAB — COMPREHENSIVE METABOLIC PANEL
ALK PHOS: 71 U/L (ref 38–126)
ALT: 16 U/L — AB (ref 17–63)
AST: 20 U/L (ref 15–41)
Albumin: 3.5 g/dL (ref 3.5–5.0)
Anion gap: 8 (ref 5–15)
BUN: 12 mg/dL (ref 6–20)
CALCIUM: 8.5 mg/dL — AB (ref 8.9–10.3)
CO2: 29 mmol/L (ref 22–32)
CREATININE: 0.82 mg/dL (ref 0.61–1.24)
Chloride: 97 mmol/L — ABNORMAL LOW (ref 101–111)
GFR calc non Af Amer: >60 mL/min (ref >60)
Glucose, Bld: 125 mg/dL — ABNORMAL HIGH (ref 65–99)
Potassium: 3.1 mmol/L — ABNORMAL LOW (ref 3.5–5.1)
SODIUM: 134 mmol/L — AB (ref 135–145)
Total Bilirubin: 0.5 mg/dL (ref 0.3–1.2)
Total Protein: 7.8 g/dL (ref 6.5–8.1)

## 2015-09-12 LAB — TSH: TSH: 0.617 u[IU]/mL (ref 0.350–4.500)

## 2015-09-12 MED ORDER — SODIUM CHLORIDE 0.9 % IV SOLN
Freq: Once | INTRAVENOUS | Status: AC
  Administered 2015-09-12: 12:00:00 via INTRAVENOUS

## 2015-09-12 MED ORDER — SODIUM CHLORIDE 0.9 % IV SOLN
240.0000 mg | Freq: Once | INTRAVENOUS | Status: AC
  Administered 2015-09-12: 240 mg via INTRAVENOUS
  Filled 2015-09-12: qty 4

## 2015-09-12 MED ORDER — HEPARIN SOD (PORK) LOCK FLUSH 100 UNIT/ML IV SOLN
500.0000 [IU] | Freq: Once | INTRAVENOUS | Status: AC | PRN
  Administered 2015-09-12: 500 [IU]

## 2015-09-12 MED ORDER — SODIUM CHLORIDE 0.9% FLUSH: 10.0000 mL | INTRAVENOUS | Status: DC | PRN

## 2015-09-12 NOTE — Progress Notes (Signed)
Patient tolerated infusion well.  VSS.  Ambulatory out of clinic.

## 2015-09-12 NOTE — Assessment & Plan Note (Addendum)
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  Treated by Dr. Tressie Stalker at [facility] with Carboplatin/Pemetrexed every 21 days x 6 cycles (12/10/2014- 03/25/2015) with transition to maintenance Pemetrexed every 21 days with last dose being administered on 08/01/2015 with imaging following demonstrating progression of disease, resulting in a change in therapy to [place] beginning on 08/29/2015.  Oncology history is updated.  Pre-treatment labs today: CBC diff, CMET.  I personally reviewed and went over laboratory results with the patient.  The results are noted within this dictation.  Hypokalemia is noted and addressed with an Rx for Kdur 40 mEq daily.  I personally reviewed and went over radiographic studies with the patient.  The results are noted within this dictation.  MRI of brain was performed on 09/05/2015 due to patient's complaint of intermittent dizziness.  MRI of brain demonstrated metastatic disease to brain.  We have reviewed options moving forward regarding treatment.  I have discussed previously with Dr. Lisbeth Renshaw as well who would consider this patient for [facility].  Unfortunately, the patient received a phone call from [place] and frightened the patient and family regarding upcoming [place] planning.  I was able to ease their minds and explained that the language and terminology used was accurate.  Fortunately, I was able to ease their minds and explain the process to the patient and his wife.  They are thankful for the explanation and appreciative of the information provided.  Given lack of symptomatology of these brain lesions and no mass effect, we have opted to avoid steroids given that the patient is on immunotherapy, Opdivo.  He has been referred to [facility] X[facility] for consideration of SBRT.  He is scheduled for MRI brain imaging on 09/16/2015 with consultation with Dr. Lisbeth Renshaw on 09/18/2015.  Return in 2 weeks for follow-up and cycle #3.  He  will be due for repeat staging the end of Sept/beginning of Oct (week #9) for evaluation of response.

## 2015-09-12 NOTE — Patient Instructions (Signed)
[place] at [facility] Discharge Instructions  RECOMMENDATIONS MADE BY THE CONSULTANT AND ANY TEST RESULTS WILL BE SENT TO YOUR REFERRING PHYSICIAN.  TREATMENT TODAY. RADIATION APPOINTMENTS AS SCHEDULED. MRI AS SCHEDULED. RETURN IN 2 WEEKS FOR FOLLOW UP.   Thank you for choosing [facility] to provide your oncology and hematology care.  To afford each patient quality time with our provider, please arrive at least 15 minutes before your scheduled appointment time.   Beginning January 23rd 2017 lab work for the [facility] will be done in the  Main lab at [facility] on 1st floor. If you have a lab appointment with the [facility] please come in thru the  Main Entrance and check in at the main information desk  You need to re-schedule your appointment should you arrive 10 or more minutes late.  We strive to give you quality time with our providers, and arriving late affects you and other patients whose appointments are after yours.  Also, if you no show three or more times for appointments you may be dismissed from the clinic at the providers discretion.     Again, thank you for choosing [facility].  Our hope is that these requests will decrease the amount of time that you wait before being seen by our physicians.       _____________________________________________________________  Should you have questions after your visit to [facility], please contact our office at [phone] between the hours of 8:30 a.m. and 4:30 p.m.  Voicemails left after 4:30 p.m. will not be returned until the following business day.  For prescription refill requests, have your pharmacy contact our office.         Resources For Cancer Patients and their Caregivers ? American Cancer Society: Can assist with transportation, wigs, general needs, runs Look Good Feel Better.        206 553 3320 ? Cancer Care: Provides  financial assistance, online support groups, medication/co-pay assistance.  1-800-813-HOPE 450-471-1016) ? [place] Assists [place] cancer patients and their families through emotional , educational and financial support.  219-219-1919 ? [place] DSS Where to apply for food stamps, Medicaid and utility assistance. 228-737-5253 ? [facility]: Transportation to medical appointments. 973-793-9394 ? Social Security Administration: May apply for disability if have a Stage IV cancer. (630)799-4677 224-160-7710 ? [employer], Disability and Transit Services: Assists with nutrition, care and transit needs. [place] Support Programs: '@10RELATIVEDAYS'$ @ > Cancer Support Group  2nd Tuesday of the month 1pm-2pm, Journey Room  > Creative Journey  3rd Tuesday of the month 1130am-1pm, Journey Room  > Look Good Feel Better  1st Wednesday of the month 10am-12 noon, Journey Room (Call [place] to register [phone])

## 2015-09-12 NOTE — Patient Instructions (Signed)
[facility] Discharge Instructions for Patients Receiving Chemotherapy   Beginning January 23rd 2017 lab work for the [facility] will be done in the  Main lab at [facility] on 1st floor. If you have a lab appointment with the [facility] please come in thru the  Main Entrance and check in at the main information desk   Today you received the following chemotherapy agent: Opdivo.     If you develop nausea and vomiting, or diarrhea that is not controlled by your medication, call the clinic.  The clinic phone number is [phone]. Office hours are Monday-Friday 8:30am-5:00pm.  BELOW ARE SYMPTOMS THAT SHOULD BE REPORTED IMMEDIATELY:  *FEVER GREATER THAN 101.0 F  *CHILLS WITH OR WITHOUT FEVER  NAUSEA AND VOMITING THAT IS NOT CONTROLLED WITH YOUR NAUSEA MEDICATION  *UNUSUAL SHORTNESS OF BREATH  *UNUSUAL BRUISING OR BLEEDING  TENDERNESS IN MOUTH AND THROAT WITH OR WITHOUT PRESENCE OF ULCERS  *URINARY PROBLEMS  *BOWEL PROBLEMS  UNUSUAL RASH Items with * indicate a potential emergency and should be followed up as soon as possible. If you have an emergency after office hours please contact your primary care physician or go to the nearest emergency department.  Please call the clinic during office hours if you have any questions or concerns.   You may also contact the Patient Navigator at [phone] should you have any questions or need assistance in obtaining follow up care.      Resources For Cancer Patients and their Caregivers ? American Cancer Society: Can assist with transportation, wigs, general needs, runs Look Good Feel Better.        (207)010-5505 ? Cancer Care: Provides financial assistance, online support groups, medication/co-pay assistance.  1-800-813-HOPE (408) 010-5722) ? [facility] Assists [place] cancer patients and their families through emotional , educational and financial support.   514-276-5786 ? [place] DSS Where to apply for food stamps, Medicaid and utility assistance. 217-021-7311 ? [facility]: Transportation to medical appointments. (941) 490-5384 ? Social Security Administration: May apply for disability if have a Stage IV cancer. [phone] 602-876-2404 ? [employer], Disability and Transit Services: Assists with nutrition, care and transit needs. [phone]

## 2015-09-12 NOTE — Progress Notes (Signed)
Carl Simmons, [place] [id number]  Adenocarcinoma of right lung [facility])  CURRENT THERAPY: Opdivo beginning on 08/29/2015 in the salvage setting with Xgeva monthly beginning on 08/29/2015.  INTERVAL HISTORY: Carl Simmons 55 y.o. 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  Treated by Dr. Tressie Stalker at [facility] with Carboplatin/Pemetrexed every 21 days x 6 cycles (12/10/2014- 03/25/2015) with transition to maintenance Pemetrexed every 21 days with last dose being administered on 08/01/2015 with imaging following demonstrating progression of disease, resulting in a change in therapy to [place] beginning on 08/29/2015.    Adenocarcinoma of right lung ([place])   11/20/2014 Procedure    Right scalp skin biopsy     11/20/2014 Pathology Results    Adenocarcinoma of lung primary. PDL-1 NEGATIVE (0% tumor proportion score); EGFR mutation NEGATIVE; no evidence of ALK gene rearrangement detected by FISH; no reaarnagement of ROS1 gene detected.     11/29/2014 PET scan    Large hypermetabolic mass within the RLL with central necrosis. Hypermetabolic right suprahilar mass with postobstructive collapse of the RUL. Subcarinal, paratracheal, prevascular and R internal mammary nodal metastases. Small R adrenal gland nodule.     12/10/2014 - 03/25/2015 Chemotherapy    Carboplatin and pemetrexed 6 cycles     02/11/2015 PET scan    Partial metabolic response to therapy, with decrease in hypermetabolic right lung masses, postobstructive right upper lobe consolidation, thoracic lymphadenopathy and right adrenal metastasis. No new or progressive disease identified.     04/05/2015 PET scan    No significant change in hypermetabolic right lung masses and thoracic lymphadenopathy. No new or progressive disease identified.     04/15/2015 - 08/01/2015 Chemotherapy    Pemetrexed maintenance       08/14/2015 PET scan    Overall progression of metastatic lung cancer. Increasingly hypermetabolic mediastinal lymph nodes and R lung masses, enlarging R adrenal mass and new osseous lesions. Enlarging hypermetabolic R-sided scalp nodules.     08/14/2015 Progression    Progression of disease on PET imaging     08/29/2015 -  Chemotherapy    Opdivo     08/29/2015 Miscellaneous    Xgeva started monthly     09/05/2015 Imaging    MRI brain- Multiple metastatic deposits in the brain. At least 9 lesions are identified. Mild edema around the 10 mm lesion in the right cerebellum.     He tolerated his first cycle of immunotherapy well.  He notes fatigue.  Otherwise, he denies any complaints.   Opdivo side effects:  Lung problems (pneumonitis):   Shortness of breath   Chest pain    New or worse cough  Intestinal problems (colitis):   Diarrhea or more bowel movements than usual   Stools that are black, tarry, sticky, or have bloodwork mucous   Severe stomach (abdominal) pain or tenderness  Liver problems (hepatitis):   Yellowing of skin or the sclera of eyes   Nausea or vomiting   Pain on the right side of abdomen   Dark urine   Feeling less hungry than usual   Bleeding or bruising more easily than normal  Hormone gland problems (thyroid, pituitary, adrenal glands, and pancreas):   Rapid heartbeat   Weight loss or weight gain   Increased sweating   Feeling more hungry or thirsty   Urinating more than usual   Hair loss  Feeling cold   Constipation   Voice changes (voice getting deeper)   Muscle aches   Dizziness or fainting   Headaches that will not go away, or unusual headaches  Kidney problems (nephritis and kidney failure):   Change in amount of urine or color of urine   Problems and other organs:   Rash   Change in eyesight   Severe or persistent muscle or joint pains   Severe muscle weakness  Infusion (IV) reactions:   Chills or shaking   Shortness of breath or  wheezing   Itching or rash   Flushing   Dizziness   Fevers   Feeling like passing out   Review of Systems  Constitutional: Negative for chills and fever.  HENT: Negative.   Eyes: Negative.   Respiratory: Negative.  Negative for cough.   Cardiovascular: Negative.  Negative for chest pain.  Gastrointestinal: Negative.   Genitourinary: Negative.   Musculoskeletal: Negative.   Skin: Negative.   Neurological: Negative.  Negative for weakness.  Endo/Heme/Allergies: Negative.   Psychiatric/Behavioral: Negative.     Past Medical History:  Diagnosis Date  . Adenocarcinoma of right lung ([place]) 07/11/2015  . Bone metastasis ([place]) 08/24/2015  . Cirrhosis of liver ([place]) 08/14/2015   PET on 08/14/2015 demonstrates cirrhotic liver.     History reviewed. No pertinent surgical history.  Family History  Problem Relation Age of Onset  . Cancer Father     thyroid cancer  . Lupus Sister   . Cancer Sister     metastatic ovarian    Social History   Social History  . Marital status: Married    Spouse name: N/A  . Number of children: N/A  . Years of education: N/A   Social History Main Topics  . Smoking status: Current Every Day Smoker    Packs/day: 2.00  . Smokeless tobacco: Never Used  . Alcohol use No  . Drug use: No  . Sexual activity: Not Asked   Other Topics Concern  . None   Social History Narrative  . None     PHYSICAL EXAMINATION  ECOG PERFORMANCE STATUS: 1 - Symptomatic but completely ambulatory  There were no vitals filed for this visit.  BP 118/87 P 94 T 99 R 20  GENERAL:alert, no distress, well nourished, well developed, comfortable, cooperative, obese, smiling and accompanied by his wife.  Nervous/anxious about "neurosurgery." SKIN: skin color, texture, turgor are normal, no rashes or significant lesions HEAD: Normocephalic, No masses, lesions, tenderness or abnormalities EYES: normal, EOMI, Conjunctiva are pink and non-injected EARS: External ears  normal OROPHARYNX:lips, buccal mucosa, and tongue normal and mucous membranes are moist  NECK: supple, trachea midline LYMPH:  no palpable lymphadenopathy BREAST:not examined LUNGS: clear to auscultation and percussion HEART: regular rate & rhythm, no murmurs, no gallops, S1 normal and S2 normal ABDOMEN:abdomen soft, non-tender, obese and normal bowel sounds BACK: Back symmetric, no curvature. EXTREMITIES:less then 2 second capillary refill, no joint deformities, effusion, or inflammation, no edema, no cyanosis  NEURO: alert & oriented x 3 with fluent speech, no focal motor/sensory deficits, gait normal   LABORATORY DATA: CBC    Component Value Date/Time   WBC 11.2 (H) 09/12/2015 1010   RBC 4.44 09/12/2015 1010   HGB 15.1 09/12/2015 1010   HCT 43.4 09/12/2015 1010   PLT 504 (H) 09/12/2015 1010   MCV 97.7 09/12/2015 1010   MCH 34.0 09/12/2015 1010   MCHC 34.8 09/12/2015 1010   RDW 12.4 09/12/2015 1010   LYMPHSABS 2.9  09/12/2015 1010   MONOABS 1.0 09/12/2015 1010   EOSABS 0.1 09/12/2015 1010   BASOSABS 0.0 09/12/2015 1010      Chemistry      Component Value Date/Time   NA 134 (L) 09/12/2015 1010   K 3.1 (L) 09/12/2015 1010   CL 97 (L) 09/12/2015 1010   CO2 29 09/12/2015 1010   BUN 12 09/12/2015 1010   CREATININE 0.82 09/12/2015 1010      Component Value Date/Time   CALCIUM 8.5 (L) 09/12/2015 1010   ALKPHOS 71 09/12/2015 1010   AST 20 09/12/2015 1010   ALT 16 (L) 09/12/2015 1010   BILITOT 0.5 09/12/2015 1010        PENDING LABS:   RADIOGRAPHIC STUDIES:  Mr [name] SA Contrast  Result Date: 09/05/2015 CLINICAL DATA:  Carcinoma lung.  Rule out metastatic disease. EXAM: MRI HEAD WITHOUT AND WITH CONTRAST TECHNIQUE: Multiplanar, multiecho pulse sequences of the brain and surrounding structures were obtained without and with intravenous contrast. CONTRAST:  66m MULTIHANCE GADOBENATE DIMEGLUMINE 529 MG/ML IV SOLN COMPARISON:  MRI 11/26/2014 FINDINGS: Ventricle size  normal. Cerebral volume normal. No shift of the midline structures. Small surgical defect in the right temporal parietal scalp. No definite bony defect. Multiple enhancing metastatic deposits are identified in the brain. The largest lesion in the right superior lateral cerebellum measures 10 mm with central necrosis and mild surrounding edema. Other lesions are under 1 cm. There are lesions in the cerebellum bilaterally as well as in both cerebral hemispheres. At least 9 lesions are identified. Negative for intracranial hemorrhage. Small focus of restricted diffusion in the left frontal lobe without associated enhancement. Possible acute infarct versus nonenhancing metastatic disease. Paranasal sinuses clear. No orbital mass lesion. Pituitary not enlarged. No bony metastatic disease identified. 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 At least 9 lesions are identified. Mild edema around the 10 mm lesion in the right cerebellum. Possible small area of acute infarct in the high left frontal lobe versus nonenhancing metastatic disease. Electronically Signed   By: CFranchot GalloM.D.   On: 09/05/2015 09:17   Nm Pet Image Restag (ps) Skull Base To Thigh  Result Date: 08/14/2015 CLINICAL DATA:  Subsequent treatment strategy for right lung cancer. EXAM: NUCLEAR MEDICINE PET SKULL BASE TO THIGH TECHNIQUE: 12.6 mCi F-18 FDG was injected intravenously. Full-ring PET imaging was performed from the skull base to thigh after the radiotracer. CT data was obtained and used for attenuation correction and anatomic localization. FASTING BLOOD GLUCOSE:  Value: 112 mg/dl COMPARISON:  04/05/2015. FINDINGS: NECK A scalp nodule overlying the right parietal bone measures 1.6 cm (CT image 3) with an SUV max of 22.9, previously 8 mm. Soft tissue nodule in the right sub occipital region measures 9 mm (CT image 12) with an SUV max of 6.0, previously 6 mm. No hypermetabolic lymph nodes in the neck. CT images show no acute  findings. CHEST Hypermetabolic low right paratracheal lymph node measures 1.8 cm, stable in size but increased in FDG avidity, with an SUV max of 11.5, previously 8.8. Subcarinal lymph node measures 1.4 cm with an SUV max of 9.9. Central right upper lobe/perihilar soft tissue mass is difficult to accurately measure without IV contrast and has an SUV max of 16.6, previously 14.1. There is postobstructive pneumonitis in the right upper lobe with a mildly hypermetabolic 12 mm nodule (CT image 35 of series 8). Dominant mass in the right lower lobe measures 6.3 x 6.4 cm, stable in size, with an  SUV max of 21.4 compared to 15.6 previously. Right IJ Port-A-Cath appears coiled in the region of the internal jugular vein and terminates in the low SVC. Atherosclerotic calcification of the arterial vasculature. No pericardial effusion. ABDOMEN/PELVIS Hypermetabolic right adrenal mass measures 1.7 x 2.6 cm, previously 1.1 x 1.5 cm, with an SUV max of 13.7. No abnormal hypermetabolism in the liver, left adrenal gland, spleen or pancreas. There is focal hypermetabolism associated with the rectum, as before, nonspecific. Liver margin is slightly irregular. Liver and gallbladder are otherwise unremarkable. Left adrenal gland appears somewhat thickened, stable. Adrenal glands, kidneys, spleen, pancreas, stomach and bowel are grossly unremarkable. Atherosclerotic calcification of the arterial vasculature. SKELETON There is new focal hypermetabolism associated with the right fifth lateral rib with possible subtle cortical loss (CT image 79, series 4). New focal hypermetabolism is also seen along the posterior margin of the left femoral neck with an SUV max is 6.3 and no definite CT correlate. IMPRESSION: 1. Overall progression of metastatic lung cancer as evidenced by increasingly hypermetabolic mediastinal lymph nodes and right lung masses, enlarging right adrenal mass and new osseous lesions. 2. Enlarging hypermetabolic right-sided  scalp nodules are worrisome for metastatic disease. 3. Focal hypermetabolism associated with the rectum, nonspecific. 4. Liver appears cirrhotic. Electronically Signed   By: Lorin Picket M.D.   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  Treated by Dr. Tressie Stalker at [facility] with Carboplatin/Pemetrexed every 21 days x 6 cycles (12/10/2014- 03/25/2015) with transition to maintenance Pemetrexed every 21 days with last dose being administered on 08/01/2015 with imaging following demonstrating progression of disease, resulting in a change in therapy to [place] beginning on 08/29/2015.  Oncology history is updated.  Pre-treatment labs today: CBC diff, CMET.  I personally reviewed and went over laboratory results with the patient.  The results are noted within this dictation.  Hypokalemia is noted and addressed with an Rx for Kdur 40 mEq daily.  I personally reviewed and went over radiographic studies with the patient.  The results are noted within this dictation.  MRI of brain was performed on 09/05/2015 due to patient's complaint of intermittent dizziness.  MRI of brain demonstrated metastatic disease to brain.  We have reviewed options moving forward regarding treatment.  I have discussed previously with Dr. Lisbeth Renshaw as well who would consider this patient for [facility].  Unfortunately, the patient received a phone call from [place] and frightened the patient and family regarding upcoming [place] planning.  I was able to ease their minds and explained that the language and terminology used was accurate.  Fortunately, I was able to ease their minds and explain the process to the patient and his wife.  They are thankful for the explanation and appreciative of the information provided.  Given lack of symptomatology of these brain lesions and no mass  effect, we have opted to avoid steroids given that the patient is on immunotherapy, Opdivo.  He has been referred to [facility] XRT for consideration of SBRT.  He is scheduled for MRI brain imaging on 09/16/2015 with consultation with Dr. Lisbeth Renshaw on 09/18/2015.  Return in 2 weeks for follow-up and cycle #3.  He will be due for repeat staging the end of Sept/beginning of Oct (week #9) for evaluation of response.   ORDERS PLACED FOR THIS ENCOUNTER: No orders of the  defined types were placed in this encounter.   MEDICATIONS PRESCRIBED THIS ENCOUNTER: No orders of the defined types were placed in this encounter.   THERAPY PLAN:  Continue treatment as planned.  [facility] consultation is scheduled.  All questions were answered. The patient knows to call the clinic with any problems, questions or concerns. We can certainly see the patient much sooner if necessary.  Patient and plan discussed with Dr. Ancil Linsey and she is in agreement with the aforementioned.   This note is electronically signed by: Doy Mince 09/12/2015 5:37 PM

## 2015-09-13 ENCOUNTER — Other Ambulatory Visit: Payer: BLUE CROSS/BLUE SHIELD

## 2015-09-13 ENCOUNTER — Telehealth (HOSPITAL_COMMUNITY): Payer: Self-pay

## 2015-09-13 DIAGNOSIS — C3491 Malignant neoplasm of unspecified part of right bronchus or lung: Secondary | ICD-10-CM

## 2015-09-13 DIAGNOSIS — C7951 Secondary malignant neoplasm of bone: Secondary | ICD-10-CM

## 2015-09-13 MED ORDER — POTASSIUM CHLORIDE CRYS ER 20 MEQ PO TBCR: 20.0000 meq | EXTENDED_RELEASE_TABLET | Freq: Two times a day (BID) | ORAL | 0 refills | Status: DC

## 2015-09-13 NOTE — Telephone Encounter (Signed)
-----   Message from [facility], PA-C sent at 09/12/2015  4:58 PM EDT ----- Please call in [place] 20 mEq BID #60 with 0 refills.

## 2015-09-13 NOTE — Telephone Encounter (Signed)
Prescription sent to patients pharmacy for k-dur per in basket. Called wife to let patient know. She verbalized understanding.

## 2015-09-16 ENCOUNTER — Ambulatory Visit: Payer: BLUE CROSS/BLUE SHIELD | Admitting: Radiation Oncology

## 2015-09-16 ENCOUNTER — Other Ambulatory Visit: Payer: BLUE CROSS/BLUE SHIELD

## 2015-09-16 ENCOUNTER — Ambulatory Visit
Admission: RE | Admit: 2015-09-16 | Discharge: 2015-09-16 | Disposition: A | Discharge status: Home / Self Care | Payer: BLUE CROSS/BLUE SHIELD | Source: Ambulatory Visit | Attending: Radiation Oncology | Admitting: Radiation Oncology

## 2015-09-16 DIAGNOSIS — C7949 Secondary malignant neoplasm of other parts of nervous system: Principal | ICD-10-CM

## 2015-09-16 DIAGNOSIS — C7931 Secondary malignant neoplasm of brain: Secondary | ICD-10-CM

## 2015-09-16 MED ORDER — GADOBENATE DIMEGLUMINE 529 MG/ML IV SOLN
20.0000 mL | Freq: Once | INTRAVENOUS | Status: AC | PRN
  Administered 2015-09-16: 20 mL via INTRAVENOUS

## 2015-09-16 NOTE — Progress Notes (Signed)
Location/Histology of Brain Tumor: Right Lung stage IV Adenocarcinoma mets to Brain   (IMPRESSION:MRI 09/16/15)  Seven infratentorial and at least 3 supratentorial metastasis. Nointerval disease progression.RIGHT temporal occipital scalp metastasis with calvarial invasion.)   Patient presented with symptoms of:  Dizziness ,  Past or anticipated interventions, if any, per neurosurgery:   Past or anticipated interventions, if any, per medical oncology: Dr. Tressie Stalker, CURRENT THERAPY: Opdivo beginning on 08/29/2015 in the salvage setting with Xgeva monthly beginning on 08/29/2015.  INTERVAL HISTORY: Carl Simmons CLASS 55 y.o. 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 Treated by Dr. Tressie Stalker at [facility] with Carboplatin/Pemetrexed every 21 days x 6 cycles (12/10/2014- 03/25/2015) with transition to maintenance Pemetrexed every 21 days with last dose being administered on 08/01/2015 with imaging following demonstrating progression of disease, resulting in a change in therapy to [place] beginning on 08/29/2015.,return for follow up 2 weeks post 09/12/15 for cycle#3,+  Dose of Decadron, if applicable:  No, patient is on Opdivo every 2 weeks  Recent neurologic symptoms, if any:   Seizures:NO  Headaches: NO, has scab size of golf ball on right side head still bleeds fom scratching  Nausea:No  Dizziness/ataxia: dizzy ness intermittent  Difficulty with hand coordination: NO  Focal numbness/weakness:No  Visual deficits/changes: No  Confusion/Memory deficits: No  Painful bone metastases at present, if any: No BP 107/82 (BP Location: Left Arm, Patient Position: Sitting, Cuff Size: Large)   Pulse (!) 110   Temp 97.8 F (36.6 C) (Oral)   Resp 20   Ht '5\' 8"'$  (1.727 m)   Wt 242 lb 4.8 oz (109.9 kg)   SpO2 96% Comment: room air  BMI 36.84 kg/m   Wt Readings from Last 3 Encounters:  09/18/15  242 lb 4.8 oz (109.9 kg)  09/12/15 242 lb 9.6 oz (110 kg)  08/29/15 248 lb 6.4 oz (112.7 kg)   SAFETY ISSUES: No  Prior radiation? NO  Pacemaker/ICD? NO  Is the patient on methotrexate: NO  Additional Complaints / other details:  Married,smoker 2ppd,no smokeless tobacco, hx alcohol abuse quit 1986 Father thyropid cancer ,sister metastatic ovarian cancer, other sister Lupus

## 2015-09-17 ENCOUNTER — Encounter: Payer: Self-pay | Admitting: Radiation Oncology

## 2015-09-17 ENCOUNTER — Encounter: Payer: Self-pay | Admitting: *Deleted

## 2015-09-17 NOTE — Progress Notes (Signed)
[facility] Clinical Social Work  Clinical Social Work was referred by patient navigator, Mont Dutton for assessment of psychosocial needs due to possible need for caregiver support. Clinical Social Worker contacted patient's wife, Butch Penny to offer support and assess for needs.  Butch Penny was very open to CSW support. CSW explained role of CSW and how CSW could assist. CSW reviewed common experiences and emotions as a caregiver of a brain tumor patient. Butch Penny was very tearful, but dealing with very common appropriate emotions. Per patient's wife, this is her second marriage and she has a daughter near by and three grandchildren. They are very involved and supportive. Wife appears very aware of prognosis and has made funeral  arrangements for them both.   Per wife, pt is receiving ss disability payments and has insurance through the [facility]. They have tried to apply for medicaid, but were denied as they have too many assets currently. They meet the income guidelines, however. CSW reviewed additional options for possible financial assistance; grant funds, Duanne [place], etc. Wife declines those resources currently and feels they are managing ok currently. CSW also reviewed possible caregiver supports through [place], [facility] and [employer]. Wife feels it is too hard to get pt out of the house and currently does not want to leave him home alone. CSW reviewed [employer] online caregiver support as an additional option. CSW provided extended supportive listening for wife today and discussed coping techniques at length. Wife appears to have strong coping abilities and good supports to assist her. CSW to prep packet of info for them and plan to leave it for them to pick up on 09/18/15. CSW has off site meeting that day and may miss them at appointment. CSW will attempt to see them at [facility] the next day. CSW encouraged wife to reach out as needed. CSW to follow for ongoing caregiver support.    Clinical Social Work interventions: Supportive  Psychiatric nurse education and referral  [place] Oneisha Ammons, [place] Tuesdays   [phone]

## 2015-09-18 ENCOUNTER — Ambulatory Visit
Admission: RE | Admit: 2015-09-18 | Discharge: 2015-09-18 | Disposition: A | Discharge status: Home / Self Care | Payer: BLUE CROSS/BLUE SHIELD | Source: Ambulatory Visit | Attending: Radiation Oncology | Admitting: Radiation Oncology

## 2015-09-18 ENCOUNTER — Encounter: Payer: Self-pay | Admitting: Radiation Oncology

## 2015-09-18 ENCOUNTER — Telehealth: Payer: Self-pay | Admitting: *Deleted

## 2015-09-18 VITALS — BP 107/82 | HR 110 | Temp 97.8°F | Resp 20 | Ht 68.0 in | Wt 242.3 lb

## 2015-09-18 DIAGNOSIS — C3491 Malignant neoplasm of unspecified part of right bronchus or lung: Secondary | ICD-10-CM | POA: Insufficient documentation

## 2015-09-18 DIAGNOSIS — F17210 Nicotine dependence, cigarettes, uncomplicated: Secondary | ICD-10-CM | POA: Insufficient documentation

## 2015-09-18 DIAGNOSIS — C7931 Secondary malignant neoplasm of brain: Secondary | ICD-10-CM

## 2015-09-18 DIAGNOSIS — K7460 Unspecified cirrhosis of liver: Secondary | ICD-10-CM | POA: Diagnosis not present

## 2015-09-18 DIAGNOSIS — Z510 Encounter for antineoplastic radiation therapy: Secondary | ICD-10-CM | POA: Diagnosis not present

## 2015-09-18 DIAGNOSIS — C7951 Secondary malignant neoplasm of bone: Secondary | ICD-10-CM | POA: Insufficient documentation

## 2015-09-18 DIAGNOSIS — C7949 Secondary malignant neoplasm of other parts of nervous system: Principal | ICD-10-CM

## 2015-09-18 HISTORY — DX: Secondary malignant neoplasm of brain: C79.31

## 2015-09-18 NOTE — Progress Notes (Signed)
Please see the Nurse Progress Note in the MD Initial Consult Encounter for this patient. 

## 2015-09-18 NOTE — Progress Notes (Signed)
Radiation Oncology         [phone] ________________________________  Name: Carl Simmons MRN: [id number]  Date: 09/18/2015  DOB: November 12, 1960  ZJ:QBHA Jan Fireman, MD  Baird Cancer, PA-C     REFERRING PHYSICIAN: Baird Cancer, PA-C   DIAGNOSIS: The primary encounter diagnosis was Adenocarcinoma of right lung ([place]). Diagnoses of Bone metastasis ([place]) and Brain metastases ([place]) were also pertinent to this visit.   HISTORY OF PRESENT ILLNESS: Carl Simmons is a 55 y.o. male seen at the request of Dr. 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 The patient was formerly treated by Dr. Tressie Stalker at [facility] in [place], but transitioned care to Dr. Whitney Muse. His completed 6 cycles of Carboplatin with ongoing Pemetrexed in February 2017. His Pemetrexed continued until 08/01/15. He was found to have progressive disease in the chest, right adrenal mass, and new osseous lesions on PET imaging leading to a change in systemic therapy to [place] on 08/29/15. He did have an MRI of the brain on 09/05/15 revealing at least 9 lesions with mild edema around the largest lesion measuring 10 mm. A MRI with SRS protocol on 09/16/15 revealed seven infratentorial and at least 3 supratentorial metastatic deposits, the largest of these measuring 13 mm. His right scalp lesion is again re-demonstrated with calvarial invasion. He comes today to review the recommendations for possible SRS.   PREVIOUS RADIATION THERAPY: No   PAST MEDICAL HISTORY:  Past Medical History:  Diagnosis Date  . Adenocarcinoma of right lung ([place]) 07/11/2015  . Bone metastasis ([place]) 08/24/2015  . Cirrhosis of liver ([place]) 08/14/2015   PET on 08/14/2015 demonstrates cirrhotic liver.        PAST SURGICAL HISTORY: Past Surgical History:  Procedure Laterality Date  . right power port placement  Right    right subclavian     FAMILY HISTORY:  Family History  Problem Relation Age of Onset  . Cancer Father     thyroid cancer  . Lupus Sister   . Cancer Sister     metastatic ovarian     SOCIAL HISTORY:  reports that he has been smoking.  He has been smoking about 2.00 packs per day. He has never used smokeless tobacco. He reports that he does not drink alcohol or use drugs.  The patient is married and lives in [place].   ALLERGIES: Review of patient's allergies indicates no known allergies.   MEDICATIONS:  Current Outpatient Prescriptions  Medication Sig Dispense Refill  . folic acid (FOLVITE) 1 MG tablet Take 1 mg by mouth daily.     Marland Kitchen lidocaine-prilocaine (EMLA) cream Apply topically as needed.    . naproxen (NAPROSYN) 500 MG tablet Take 500 mg by mouth 2 (two) times daily as needed.    . Nivolumab (OPDIVO IV) Inject into the vein. Every 2 weeks    . Omega-3 Fatty Acids (FISH OIL) 1000 MG CPDR Take 1,000 mg by mouth daily. 30 capsule 1  . potassium chloride SA (K-DUR,KLOR-CON) 20 MEQ tablet Take 1 tablet (20 mEq total) by mouth 2 (two) times daily. 60 tablet 0  . cyanocobalamin (,VITAMIN B-12,) 1000 MCG/ML injection Inject 1,000 mcg into the muscle every 30 (thirty) days.    Marland Kitchen HYDROcodone-acetaminophen (NORCO/VICODIN) 5-325 MG tablet Take 1 tablet by mouth every 4 (four) hours as needed.    . predniSONE (DELTASONE)  20 MG tablet Take at the onset of diarrhea.  Take 5 tablets ('100mg'$ ) at one time.  Then call the [place] (Patient not taking: Reported on 09/18/2015) 30 tablet 0   No current facility-administered medications for this encounter.      REVIEW OF SYSTEMS: On review of systems, the patient reports that he is doing well overall. He describes pain when he uses a comb against the eschar area. He denies bleeding today, but reports he has had several episodes of burning along the area and occasional bleeding.  He denies any chest pain, shortness of breath, cough,  fevers, chills, night sweats, unintended weight changes. He denies any bowel or bladder disturbances, and denies abdominal pain, nausea or vomiting. He denies any new musculoskeletal or joint aches or pains. A complete review of systems is obtained and is otherwise negative.     PHYSICAL EXAM:  height is '5\' 8"'$  (1.727 m) and weight is 242 lb 4.8 oz (109.9 kg). His oral temperature is 97.8 F (36.6 C). His blood pressure is 107/82 and his pulse is 110 (abnormal). His respiration is 20 and oxygen saturation is 96%.   Pain scale 0/10 In general this is a well appearing Caucasian male in no acute distress. He is alert and oriented x4 and appropriate throughout the examination. HEENT reveals that the patient is normocephalic, atraumatic. He has an eschar like lesion along the right scalp posterior to the pinna of the ear, it is about 4-5 cm that can be seen externally. EOMs are intact. PERRLA. Skin is intact without any evidence of gross lesions. Cardiovascular exam reveals a regular rate and rhythm, no clicks rubs or murmurs are auscultated. Chest is clear to auscultation bilaterally. Lymphatic assessment is performed and does not reveal any adenopathy in the cervical, supraclavicular, axillary, or inguinal chains. Abdomen has active bowel sounds in all quadrants and is intact. The abdomen is soft, non tender, non distended. Lower extremities are negative for pretibial pitting edema, deep calf tenderness, cyanosis or clubbing.   ECOG = 1  0 - Asymptomatic (Fully active, able to carry on all predisease activities without restriction)  1 - Symptomatic but completely ambulatory (Restricted in physically strenuous activity but ambulatory and able to carry out work of a light or sedentary nature. For example, light housework, office work)  2 - Symptomatic, <50% in bed during the day (Ambulatory and capable of all self care but unable to carry out any work activities. Up and about more than 50% of waking  hours)  3 - Symptomatic, >50% in bed, but not bedbound (Capable of only limited self-care, confined to bed or chair 50% or more of waking hours)  4 - Bedbound (Completely disabled. Cannot carry on any self-care. Totally confined to bed or chair)  5 - Death   Eustace Pen MM, Creech RH, Tormey DC, et al. [phone]). "Toxicity and response criteria of the [facility]". [place]. 5 (6): 649-55    LABORATORY DATA:  Lab Results  Component Value Date   WBC 11.2 (H) 09/12/2015   HGB 15.1 09/12/2015   HCT 43.4 09/12/2015   MCV 97.7 09/12/2015   PLT 504 (H) 09/12/2015   Lab Results  Component Value Date   NA 134 (L) 09/12/2015   K 3.1 (L) 09/12/2015   CL 97 (L) 09/12/2015   CO2 29 09/12/2015   Lab Results  Component Value Date   ALT 16 (L) 09/12/2015   AST 20 09/12/2015   ALKPHOS 71  09/12/2015   BILITOT 0.5 09/12/2015      RADIOGRAPHY: Mr [name] FV Contrast  Result Date: 09/16/2015 CLINICAL DATA:  Radiation planning. History of metastatic lung cancer with bone metastasis, recent scalp biopsy. Follow-up metaphysis. EXAM: MRI HEAD WITHOUT AND WITH CONTRAST TECHNIQUE: Multiplanar, multiecho pulse sequences of the brain and surrounding structures were obtained without and with intravenous contrast. CONTRAST:  48m MULTIHANCE GADOBENATE DIMEGLUMINE 529 MG/ML IV SOLN COMPARISON:  MRI of the brain September 05, 2015 FINDINGS: INTRACRANIAL CONTENTS: 7 infratentorial and at least 3 supratentorial ring-enhancing metastasis associated with FLAIR T2 hyperintense signal, largest within RIGHT cerebellum measuring 13 mm, stable from prior examination. Punctate focus of enhancement LEFT frontal lobe equivocal for cortical metastasis (axial post gadolinium image 115/160), unchanged. No new metastasis or interval progression of disease. No susceptibility artifact to suggest hemorrhage. Ventricles and sulci are overall normal for patient's age. No midline shift. Minimal white matter  changes exclusive of the aforementioned abnormality compatible with chronic small vessel ischemic disease, normal for patient's age. Old small RIGHT cerebellar infarct. No abnormal extra-axial fluid collections nor abnormal extra-axial enhancement or mass. ORBITS: The included ocular globes and orbital contents are non-suspicious. SINUSES: Small LEFT mastoid effusion. Paranasal sinus are well aerated. SKULL/SOFT TISSUES: No abnormal sellar expansion. Re- demonstration of RIGHT temporal occipital lobe scalp metastasis and osseous invasion. Craniocervical junction maintained. [place] versus nasopharyngeal mucosal retention cyst. Patient is edentulous. Heterogeneous clivus is similar. IMPRESSION: Seven infratentorial and at least 3 supratentorial metastasis. No interval disease progression. RIGHT temporal occipital scalp metastasis with calvarial invasion. Minimal white matter changes compatible with chronic small vessel ischemic disease. Old small RIGHT cerebellar infarct. Electronically Signed   By: CElon AlasM.D.   On: 09/16/2015 19:35   Mr BJeri CosWCBContrast  Result Date: 09/05/2015 CLINICAL DATA:  Carcinoma lung.  Rule out metastatic disease. EXAM: MRI HEAD WITHOUT AND WITH CONTRAST TECHNIQUE: Multiplanar, multiecho pulse sequences of the brain and surrounding structures were obtained without and with intravenous contrast. CONTRAST:  278mMULTIHANCE GADOBENATE DIMEGLUMINE 529 MG/ML IV SOLN COMPARISON:  MRI 11/26/2014 FINDINGS: Ventricle size normal. Cerebral volume normal. No shift of the midline structures. Small surgical defect in the right temporal parietal scalp. No definite bony defect. Multiple enhancing metastatic deposits are identified in the brain. The largest lesion in the right superior lateral cerebellum measures 10 mm with central necrosis and mild surrounding edema. Other lesions are under 1 cm. There are lesions in the cerebellum bilaterally as well as in both cerebral hemispheres.  At least 9 lesions are identified. Negative for intracranial hemorrhage. Small focus of restricted diffusion in the left frontal lobe without associated enhancement. Possible acute infarct versus nonenhancing metastatic disease. Paranasal sinuses clear. No orbital mass lesion. Pituitary not enlarged. No bony metastatic disease identified. 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 At least 9 lesions are identified. Mild edema around the 10 mm lesion in the right cerebellum. Possible small area of acute infarct in the high left frontal lobe versus nonenhancing metastatic disease. Electronically Signed   By: ChFranchot Gallo.D.   On: 09/05/2015 09:17       IMPRESSION/PLAN: 1. Stage IV, NSCLC, adenocarcinoma of the right lung with adrenal, bone, and brain metastases. Dr. MoLisbeth Renshawiscusses the role of radiotherapy to the brain and scalp. The patient's bony metastases are not causing him symptoms at this time. We discussed the utility of SRS to the brain metastases, and he reviews proceeding with multi-target isocenter. Dr. MoLisbeth Renshawlso discusses the options  to proceed with palliative radiotherapy in about 10 fractions to 30 Gy in the [place] office. He discusses the risks, benefits, short and long term effects of SRS radiotherapy for brain metastases. We will proceed with simulation tomorrow and anticipate SRS treatment on 09/25/15. He will meet back with Dr. Lisbeth Renshaw in [place] for simulation of his scalp lesion in the near future.   The above documentation reflects my direct findings during this shared patient visit. Please see the separate note by Dr. Lisbeth Renshaw on this date for the remainder of the patient's plan of care.    Carola Rhine, PAC

## 2015-09-18 NOTE — Telephone Encounter (Signed)
Called patient wife phone left voice message, patient appt was fro 330 PM, asked for call back if running late,traffic or missed appt can reschedule 3:47 PM

## 2015-09-19 ENCOUNTER — Ambulatory Visit
Admission: RE | Admit: 2015-09-19 | Discharge: 2015-09-19 | Disposition: A | Discharge status: Home / Self Care | Payer: BLUE CROSS/BLUE SHIELD | Source: Ambulatory Visit | Attending: Radiation Oncology | Admitting: Radiation Oncology

## 2015-09-19 VITALS — Wt 244.2 lb

## 2015-09-19 DIAGNOSIS — C7951 Secondary malignant neoplasm of bone: Secondary | ICD-10-CM | POA: Insufficient documentation

## 2015-09-19 DIAGNOSIS — C7931 Secondary malignant neoplasm of brain: Secondary | ICD-10-CM

## 2015-09-19 DIAGNOSIS — Z510 Encounter for antineoplastic radiation therapy: Secondary | ICD-10-CM | POA: Diagnosis not present

## 2015-09-19 DIAGNOSIS — C3491 Malignant neoplasm of unspecified part of right bronchus or lung: Secondary | ICD-10-CM | POA: Insufficient documentation

## 2015-09-19 MED ORDER — SODIUM CHLORIDE 0.9% FLUSH
10.0000 mL | Freq: Once | INTRAVENOUS | Status: AC
  Administered 2015-09-19: 10 mL via INTRAVENOUS

## 2015-09-19 MED ORDER — HEPARIN SOD (PORK) LOCK FLUSH 100 UNIT/ML IV SOLN
500.0000 [IU] | Freq: Once | INTRAVENOUS | Status: AC
  Administered 2015-09-19: 500 [IU] via INTRAVENOUS

## 2015-09-19 NOTE — Progress Notes (Signed)
de accessed right power port, after flushing with 10 ml normal saline and 500units/48m heparin flush  ,excellent blood return between the saline and heparin flush, huber needle intact, band aid applied over port site, patient tolerated well no c/o 10:31 AM

## 2015-09-19 NOTE — Progress Notes (Signed)
Does patient have an allergy to IV contrast dye?: No.   Has patient ever received premedication for IV contrast dye?: No.   Does patient take metformin?: No.  If patient does take metformin when was the last dose: not diabetic Date of lab work: 09/12/15 BUN: 12 CR: 0.8  IV site: Right power port  Wt 244 lb 3.2 oz (110.8 kg)   BMI 37.13 kg/m   Patient gave name and dob as identification, using sterile technique, right port accessed with 20G huber 1 in x 1 attempt, patient tolerated well, no c/o pain, flushed with 27m normal saline and excellent blood return, placed op site over site,and , called ct sim patient ready for CT sim  clamped

## 2015-09-25 ENCOUNTER — Ambulatory Visit
Admission: RE | Admit: 2015-09-25 | Payer: BLUE CROSS/BLUE SHIELD | Source: Ambulatory Visit | Admitting: Radiation Oncology

## 2015-09-26 ENCOUNTER — Encounter (HOSPITAL_BASED_OUTPATIENT_CLINIC_OR_DEPARTMENT_OTHER): Payer: BLUE CROSS/BLUE SHIELD | Admitting: Hematology & Oncology

## 2015-09-26 ENCOUNTER — Encounter (HOSPITAL_BASED_OUTPATIENT_CLINIC_OR_DEPARTMENT_OTHER): Payer: BLUE CROSS/BLUE SHIELD

## 2015-09-26 ENCOUNTER — Encounter (HOSPITAL_COMMUNITY): Payer: Self-pay | Admitting: Hematology & Oncology

## 2015-09-26 VITALS — BP 115/81 | HR 103 | Temp 97.8°F | Resp 20 | Wt 237.6 lb

## 2015-09-26 VITALS — BP 109/73 | HR 91 | Temp 98.2°F | Resp 18

## 2015-09-26 DIAGNOSIS — Z5112 Encounter for antineoplastic immunotherapy: Secondary | ICD-10-CM

## 2015-09-26 DIAGNOSIS — C792 Secondary malignant neoplasm of skin: Secondary | ICD-10-CM

## 2015-09-26 DIAGNOSIS — Z510 Encounter for antineoplastic radiation therapy: Secondary | ICD-10-CM | POA: Diagnosis not present

## 2015-09-26 DIAGNOSIS — C7951 Secondary malignant neoplasm of bone: Secondary | ICD-10-CM

## 2015-09-26 DIAGNOSIS — Z720 Tobacco use: Secondary | ICD-10-CM

## 2015-09-26 DIAGNOSIS — C3491 Malignant neoplasm of unspecified part of right bronchus or lung: Secondary | ICD-10-CM | POA: Diagnosis not present

## 2015-09-26 DIAGNOSIS — IMO0001 Reserved for inherently not codable concepts without codable children: Secondary | ICD-10-CM

## 2015-09-26 LAB — COMPREHENSIVE METABOLIC PANEL
ALT: 15 U/L — ABNORMAL LOW (ref 17–63)
ANION GAP: 9 (ref 5–15)
AST: 20 U/L (ref 15–41)
Albumin: 3.5 g/dL (ref 3.5–5.0)
Alkaline Phosphatase: 73 U/L (ref 38–126)
BUN: 7 mg/dL (ref 6–20)
CHLORIDE: 101 mmol/L (ref 101–111)
CO2: 23 mmol/L (ref 22–32)
Calcium: 8.4 mg/dL — ABNORMAL LOW (ref 8.9–10.3)
Creatinine, Ser: 0.74 mg/dL (ref 0.61–1.24)
GFR calc non Af Amer: >60 mL/min (ref >60)
Glucose, Bld: 130 mg/dL — ABNORMAL HIGH (ref 65–99)
POTASSIUM: 3.6 mmol/L (ref 3.5–5.1)
SODIUM: 133 mmol/L — AB (ref 135–145)
Total Bilirubin: 0.5 mg/dL (ref 0.3–1.2)
Total Protein: 7.5 g/dL (ref 6.5–8.1)

## 2015-09-26 LAB — CBC WITH DIFFERENTIAL/PLATELET
Basophils Absolute: 0.1 10*3/uL (ref 0.0–0.1)
Basophils Relative: 1 %
EOS ABS: 0.1 10*3/uL (ref 0.0–0.7)
EOS PCT: 1 %
HCT: 42.7 % (ref 39.0–52.0)
Hemoglobin: 14.7 g/dL (ref 13.0–17.0)
LYMPHS ABS: 2.6 10*3/uL (ref 0.7–4.0)
Lymphocytes Relative: 28 %
MCH: 33.3 pg (ref 26.0–34.0)
MCHC: 34.4 g/dL (ref 30.0–36.0)
MCV: 96.6 fL (ref 78.0–100.0)
MONO ABS: 1 10*3/uL (ref 0.1–1.0)
MONOS PCT: 10 %
Neutro Abs: 5.6 10*3/uL (ref 1.7–7.7)
Neutrophils Relative %: 60 %
PLATELETS: 367 10*3/uL (ref 150–400)
RBC: 4.42 MIL/uL (ref 4.22–5.81)
RDW: 12.2 % (ref 11.5–15.5)
WBC: 9.3 10*3/uL (ref 4.0–10.5)

## 2015-09-26 MED ORDER — SODIUM CHLORIDE 0.9 % IV SOLN
240.0000 mg | Freq: Once | INTRAVENOUS | Status: AC
  Administered 2015-09-26: 240 mg via INTRAVENOUS
  Filled 2015-09-26: qty 20

## 2015-09-26 MED ORDER — SODIUM CHLORIDE 0.9% FLUSH
10.0000 mL | INTRAVENOUS | Status: DC | PRN
  Administered 2015-09-26: 10 mL
  Filled 2015-09-26: qty 10

## 2015-09-26 MED ORDER — HEPARIN SOD (PORK) LOCK FLUSH 100 UNIT/ML IV SOLN
INTRAVENOUS | Status: AC
  Filled 2015-09-26: qty 5

## 2015-09-26 MED ORDER — DENOSUMAB 120 MG/1.7ML ~~LOC~~ SOLN
120.0000 mg | Freq: Once | SUBCUTANEOUS | Status: DC
Start: 2015-09-26 — End: 2015-09-26

## 2015-09-26 MED ORDER — HEPARIN SOD (PORK) LOCK FLUSH 100 UNIT/ML IV SOLN
500.0000 [IU] | Freq: Once | INTRAVENOUS | Status: AC | PRN
  Administered 2015-09-26: 500 [IU]

## 2015-09-26 MED ORDER — SODIUM CHLORIDE 0.9 % IV SOLN
Freq: Once | INTRAVENOUS | Status: AC
  Administered 2015-09-26: 12:00:00 via INTRAVENOUS

## 2015-09-26 NOTE — Patient Instructions (Signed)
[facility] Discharge Instructions for Patients Receiving Chemotherapy   Beginning January 23rd 2017 lab work for the Cancer Center will be done in the  Main lab at [facility] on 1st floor. If you have a lab appointment with the Cancer Center please come in thru the  Main Entrance and check in at the main information desk   Today you received the following chemotherapy agents Opdivo  To help prevent nausea and vomiting after your treatment, we encourage you to take your nausea medication   If you develop nausea and vomiting, or diarrhea that is not controlled by your medication, call the clinic.  The clinic phone number is [phone]. Office hours are Monday-Friday 8:30am-5:00pm.  BELOW ARE SYMPTOMS THAT SHOULD BE REPORTED IMMEDIATELY:  *FEVER GREATER THAN 101.0 F  *CHILLS WITH OR WITHOUT FEVER  NAUSEA AND VOMITING THAT IS NOT CONTROLLED WITH YOUR NAUSEA MEDICATION  *UNUSUAL SHORTNESS OF BREATH  *UNUSUAL BRUISING OR BLEEDING  TENDERNESS IN MOUTH AND THROAT WITH OR WITHOUT PRESENCE OF ULCERS  *URINARY PROBLEMS  *BOWEL PROBLEMS  UNUSUAL RASH Items with * indicate a potential emergency and should be followed up as soon as possible. If you have an emergency after office hours please contact your primary care physician or go to the nearest emergency department.  Please call the clinic during office hours if you have any questions or concerns.   You may also contact the Patient Navigator at [phone] should you have any questions or need assistance in obtaining follow up care.      Resources For Cancer Patients and their Caregivers ? American Cancer Society: Can assist with transportation, wigs, general needs, runs Look Good Feel Better.        1-888-227-6333 ? Cancer Care: Provides financial assistance, online support groups, medication/co-pay assistance.  1-800-813-HOPE (4673) ? [facility] Assists [place] cancer  patients and their families through emotional , educational and financial support.  336-427-4357 ? [place] DSS Where to apply for food stamps, Medicaid and utility assistance. 336-342-1394 ? RCATS: Transportation to medical appointments. 336-347-2287 ? Social Security Administration: May apply for disability if have a Stage IV cancer. 336-342-7796 1-800-772-1213 ? [place] Aging, Disability and Transit Services: Assists with nutrition, care and transit needs. 336-349-2343          

## 2015-09-26 NOTE — Patient Instructions (Addendum)
[place] at [facility] Discharge Instructions  RECOMMENDATIONS MADE BY THE CONSULTANT AND ANY TEST RESULTS WILL BE SENT TO YOUR REFERRING PHYSICIAN.  You saw Dr. Whitney Muse today. Follow up in 2 weeks with labs and treatment.  Thank you for choosing [place] at [facility] to provide your oncology and hematology care.  To afford each patient quality time with our provider, please arrive at least 15 minutes before your scheduled appointment time.   Beginning January 23rd 2017 lab work for the [facility] will be done in the  Main lab at [facility] on 1st floor. If you have a lab appointment with the [facility] please come in thru the  Main Entrance and check in at the main information desk  You need to re-schedule your appointment should you arrive 10 or more minutes late.  We strive to give you quality time with our providers, and arriving late affects you and other patients whose appointments are after yours.  Also, if you no show three or more times for appointments you may be dismissed from the clinic at the providers discretion.     Again, thank you for choosing [facility]  Our hope is that these requests will decrease the amount of time that you wait before being seen by our physicians.       _____________________________________________________________  Should you have questions after your visit to [facility], please contact our office at [phone] between the hours of 8:30 a.m. and 4:30 p.m.  Voicemails left after 4:30 p.m. will not be returned until the following business day.  For prescription refill requests, have your pharmacy contact our office.         Resources For Cancer Patients and their Caregivers ? American Cancer Society: Can assist with transportation, wigs, general needs, runs Look Good Feel Better.        705-434-6325 ? Cancer Care: Provides financial assistance, online  support groups, medication/co-pay assistance.  1-800-813-HOPE 2186406394) ? [facility] Assists [place] cancer patients and their families through emotional , educational and financial support.  (915)217-1691 ? [place] DSS Where to apply for food stamps, Medicaid and utility assistance. 610-507-7720 ? RCATS: Transportation to medical appointments. (385) 816-7051 ? Social Security Administration: May apply for disability if have a Stage IV cancer. (858)817-8065 480-809-8714 ? [employer], Disability and Transit Services: Assists with nutrition, care and transit needs. [place] Support Programs: '@10RELATIVEDAYS'$ @ > Cancer Support Group  2nd Tuesday of the month 1pm-2pm, Journey Room  > Creative Journey  3rd Tuesday of the month 1130am-1pm, Journey Room  > Look Good Feel Better  1st Wednesday of the month 10am-12 noon, Journey Room (Call Marie to register [phone])

## 2015-09-26 NOTE — Progress Notes (Signed)
Xgeva not given due to low corrected calcium per pharmacy. Will try again in 2 weeks to give with Opdivo appointment.  Patient received chemotherapy today per orders.Patient tolerated it well without problems.Patient discharged ambulatory with wife at side. Vitals stable at discharge.

## 2015-09-26 NOTE — Progress Notes (Signed)
Carl Simmons, [place] [address]  DIAGNOSIS:    Adenocarcinoma of right lung [facility])   11/20/2014 Procedure    Right scalp skin biopsy      11/20/2014 Pathology Results    Adenocarcinoma of lung primary. PDL-1 NEGATIVE (0% tumor proportion score); EGFR mutation NEGATIVE; no evidence of ALK gene rearrangement detected by FISH; no reaarnagement of ROS1 gene detected.      11/29/2014 PET scan    Large hypermetabolic mass within the RLL with central necrosis. Hypermetabolic right suprahilar mass with postobstructive collapse of the RUL. Subcarinal, paratracheal, prevascular and R internal mammary nodal metastases. Small R adrenal gland nodule.      12/10/2014 - 03/25/2015 Chemotherapy    Carboplatin and pemetrexed 6 cycles      02/11/2015 PET scan    Partial metabolic response to therapy, with decrease in hypermetabolic right lung masses, postobstructive right upper lobe consolidation, thoracic lymphadenopathy and right adrenal metastasis. No new or progressive disease identified.      04/05/2015 PET scan    No significant change in hypermetabolic right lung masses and thoracic lymphadenopathy. No new or progressive disease identified.      04/15/2015 - 08/01/2015 Chemotherapy    Pemetrexed maintenance      08/14/2015 PET scan    Overall progression of metastatic lung cancer. Increasingly hypermetabolic mediastinal lymph nodes and R lung masses, enlarging R adrenal mass and new osseous lesions. Enlarging hypermetabolic R-sided scalp nodules.      08/14/2015 Progression    Progression of disease on PET imaging      08/29/2015 -  Chemotherapy    Opdivo      08/29/2015 Miscellaneous    Xgeva started monthly      09/05/2015 Imaging    MRI brain- Multiple metastatic deposits in the brain. At least 9 lesions are identified. Mild edema around the 10 mm lesion in the right cerebellum.       CURRENT THERAPY: Opdivo beginning on 08/29/2015 in the  salvage setting with Xgeva monthly beginning on 08/29/2015.  INTERVAL HISTORY: Carl Simmons 55 y.o. 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  Treated by Dr. Tressie Stalker at [facility] with Carboplatin/Pemetrexed every 21 days x 6 cycles (12/10/2014- 03/25/2015) with transition to maintenance Pemetrexed every 21 days with last dose being administered on 08/01/2015 with imaging following demonstrating progression of disease, resulting in a change in therapy to [place] beginning on 08/29/2015.  He tolerated his first cycle of immunotherapy well.  He notes fatigue.  Otherwise, he denies any complaints.   Patient is accompanied by wife. He is here for cycle #3 of Nivolumab as he continues radiation.   Patient continues to smoke and had a pack of Marlboro next to him.   Patient was originally scheduled to receive SRS yesterday but one of the physicians was not satisfied with the markings so they rescheduled for tomorrow.   Patient's wife is mentioning that she is still receiving [place] lab bills and does not know why.   He denies diarrhea, worsening SOB, cough. Appetite is still fairly good.   Review of Systems  Constitutional: Negative for chills and fever.  HENT: Negative.   Eyes: Negative.   Respiratory: Negative.  Negative for cough.   Cardiovascular: Negative.  Negative for chest pain.  Gastrointestinal: Negative.   Genitourinary: Negative.   Musculoskeletal: Negative.  Skin: Negative.        Patient has right ankle lesion 3-4 days ago; doesn't hurt.  Neurological: Negative.  Negative for weakness.  Endo/Heme/Allergies: Negative.   Psychiatric/Behavioral: Negative.   14 point review of systems was performed and is negative except as detailed under history of present illness and above   Past Medical History:  Diagnosis Date  . Adenocarcinoma of right lung ([place]) 07/11/2015  . Bone  metastasis ([place]) 08/24/2015  . Cirrhosis of liver ([place]) 08/14/2015   PET on 08/14/2015 demonstrates cirrhotic liver.     Past Surgical History:  Procedure Laterality Date  . right power port placement Right    right subclavian    Family History  Problem Relation Age of Onset  . Cancer Father     thyroid cancer  . Lupus Sister   . Cancer Sister     metastatic ovarian    Social History   Social History  . Marital status: Married    Spouse name: N/A  . Number of children: N/A  . Years of education: N/A   Social History Main Topics  . Smoking status: Current Every Day Smoker    Packs/day: 2.00  . Smokeless tobacco: Never Used  . Alcohol use No  . Drug use: No  . Sexual activity: Not Asked   Other Topics Concern  . None   Social History Narrative  . None     PHYSICAL EXAMINATION  ECOG PERFORMANCE STATUS: 1 - Symptomatic but completely ambulatory  Vitals:   09/26/15 0952  BP: 115/81  Pulse: (!) 103  Resp: 20  Temp: 97.8 F (36.6 C)     GENERAL:alert, no distress, well nourished, well developed, comfortable, cooperative, obese, smiling and accompanied by his wife.   SKIN: skin color, texture, turgor are normal, no rashes or significant lesions HEAD: Normocephalic  Lesion on scalp 2 cm posterior to the right ear EYES: normal, EOMI, Conjunctiva are pink and non-injected EARS: External ears normal OROPHARYNX:lips, buccal mucosa, and tongue normal and mucous membranes are moist  NECK: supple, trachea midline LYMPH:  no palpable lymphadenopathy BREAST:not examined LUNGS: clear to auscultation and percussion HEART: regular rate & rhythm, no murmurs, no gallops, S1 normal and S2 normal ABDOMEN:abdomen soft, non-tender, obese and normal bowel sounds BACK: Back symmetric, no curvature. EXTREMITIES:less then 2 second capillary refill, no joint deformities, effusion, or inflammation, no edema, no cyanosis  Lesion 0.5cm on right ankle, subcutaneous, mobile NEURO:  alert & oriented x 3 with fluent speech, no focal motor/sensory deficits, gait normal   LABORATORY DATA: I have reviewed the data as listed.  CBC    Component Value Date/Time   WBC 9.3 09/26/2015 1038   RBC 4.42 09/26/2015 1038   HGB 14.7 09/26/2015 1038   HCT 42.7 09/26/2015 1038   PLT 367 09/26/2015 1038   MCV 96.6 09/26/2015 1038   MCH 33.3 09/26/2015 1038   MCHC 34.4 09/26/2015 1038   RDW 12.2 09/26/2015 1038   LYMPHSABS 2.6 09/26/2015 1038   MONOABS 1.0 09/26/2015 1038   EOSABS 0.1 09/26/2015 1038   BASOSABS 0.1 09/26/2015 1038      Chemistry      Component Value Date/Time   NA 133 (L) 09/26/2015 1038   K 3.6 09/26/2015 1038   CL 101 09/26/2015 1038   CO2 23 09/26/2015 1038   BUN 7 09/26/2015 1038   CREATININE 0.74 09/26/2015 1038      Component Value Date/Time   CALCIUM 8.4 (L) 09/26/2015 1038   ALKPHOS  73 09/26/2015 1038   AST 20 09/26/2015 1038   ALT 15 (L) 09/26/2015 1038   BILITOT 0.5 09/26/2015 1038       RADIOGRAPHIC STUDIES: I have personally reviewed the radiological images as listed and agreed with the findings in the report.  Mr [name] Wo Contrast  Result Date: 09/16/2015 CLINICAL DATA:  Radiation planning. History of metastatic lung cancer with bone metastasis, recent scalp biopsy. Follow-up metaphysis. EXAM: MRI HEAD WITHOUT AND WITH CONTRAST TECHNIQUE: Multiplanar, multiecho pulse sequences of the brain and surrounding structures were obtained without and with intravenous contrast. CONTRAST:  50m MULTIHANCE GADOBENATE DIMEGLUMINE 529 MG/ML IV SOLN COMPARISON:  MRI of the brain September 05, 2015 FINDINGS: INTRACRANIAL CONTENTS: 7 infratentorial and at least 3 supratentorial ring-enhancing metastasis associated with FLAIR T2 hyperintense signal, largest within RIGHT cerebellum measuring 13 mm, stable from prior examination. Punctate focus of enhancement LEFT frontal lobe equivocal for cortical metastasis (axial post gadolinium image 115/160),  unchanged. No new metastasis or interval progression of disease. No susceptibility artifact to suggest hemorrhage. Ventricles and sulci are overall normal for patient's age. No midline shift. Minimal white matter changes exclusive of the aforementioned abnormality compatible with chronic small vessel ischemic disease, normal for patient's age. Old small RIGHT cerebellar infarct. No abnormal extra-axial fluid collections nor abnormal extra-axial enhancement or mass. ORBITS: The included ocular globes and orbital contents are non-suspicious. SINUSES: Small LEFT mastoid effusion. Paranasal sinus are well aerated. SKULL/SOFT TISSUES: No abnormal sellar expansion. Re- demonstration of RIGHT temporal occipital lobe scalp metastasis and osseous invasion. Craniocervical junction maintained. Thornwaldt versus nasopharyngeal mucosal retention cyst. Patient is edentulous. Heterogeneous clivus is similar. IMPRESSION: Seven infratentorial and at least 3 supratentorial metastasis. No interval disease progression. RIGHT temporal occipital scalp metastasis with calvarial invasion. Minimal white matter changes compatible with chronic small vessel ischemic disease. Old small RIGHT cerebellar infarct. Electronically Signed   By: CElon AlasM.D.   On: 09/16/2015 19:35   Mr BJeri CosWLKContrast  Result Date: 09/05/2015 CLINICAL DATA:  Carcinoma lung.  Rule out metastatic disease. EXAM: MRI HEAD WITHOUT AND WITH CONTRAST TECHNIQUE: Multiplanar, multiecho pulse sequences of the brain and surrounding structures were obtained without and with intravenous contrast. CONTRAST:  23mMULTIHANCE GADOBENATE DIMEGLUMINE 529 MG/ML IV SOLN COMPARISON:  MRI 11/26/2014 FINDINGS: Ventricle size normal. Cerebral volume normal. No shift of the midline structures. Small surgical defect in the right temporal parietal scalp. No definite bony defect. Multiple enhancing metastatic deposits are identified in the brain. The largest lesion in the right  superior lateral cerebellum measures 10 mm with central necrosis and mild surrounding edema. Other lesions are under 1 cm. There are lesions in the cerebellum bilaterally as well as in both cerebral hemispheres. At least 9 lesions are identified. Negative for intracranial hemorrhage. Small focus of restricted diffusion in the left frontal lobe without associated enhancement. Possible acute infarct versus nonenhancing metastatic disease. Paranasal sinuses clear. No orbital mass lesion. Pituitary not enlarged. No bony metastatic disease identified. 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 At least 9 lesions are identified. Mild edema around the 10 mm lesion in the right cerebellum. Possible small area of acute infarct in the high left frontal lobe versus nonenhancing metastatic disease. Electronically Signed   By: ChFranchot Gallo.D.   On: 09/05/2015 09:17     PATHOLOGY:    ASSESSMENT AND PLAN:  Adenocarcinoma of right lung, stage IV  Scalp/skin metastases Dizziness Bone metastases Tobacco Abuse  We will proceed with cycle #3  of nivolumab today. He is schedule for [facility] treatment tomorrow Sept. 1 with Dr. Lisbeth Renshaw.   Patient was told to call if right ankle lesion increases in size or begins to hurt.   He will also bring [place] bills he is still receiving.  He is scheduled for follow up with Marcello Moores on Sept. 14  He needs no refills at this time.   THERAPY PLAN:  Continue treatment as planned.    All questions were answered. The patient knows to call the clinic with any problems, questions or concerns. We can certainly see the patient much sooner if necessary.  This document serves as a record of services personally performed by Ancil Linsey, MD. It was created on her behalf by Elmyra Ricks, a trained medical scribe. The creation of this record is based on the scribe's personal observations and the provider's statements to them. This document has been checked and approved by the  attending provider.  I have reviewed the above documentation for accuracy and completeness and I agree with the above.  This note is electronically signed by: Molli Hazard, MD   09/26/2015 1:43 PM

## 2015-09-27 ENCOUNTER — Other Ambulatory Visit: Payer: Self-pay | Admitting: Radiation Therapy

## 2015-09-27 ENCOUNTER — Encounter: Payer: Self-pay | Admitting: *Deleted

## 2015-09-27 ENCOUNTER — Ambulatory Visit
Admission: RE | Admit: 2015-09-27 | Discharge: 2015-09-27 | Disposition: A | Discharge status: Home / Self Care | Payer: BLUE CROSS/BLUE SHIELD | Source: Ambulatory Visit | Attending: Radiation Oncology | Admitting: Radiation Oncology

## 2015-09-27 VITALS — BP 114/76 | HR 86 | Temp 98.4°F | Resp 18

## 2015-09-27 DIAGNOSIS — Z510 Encounter for antineoplastic radiation therapy: Secondary | ICD-10-CM | POA: Diagnosis not present

## 2015-09-27 DIAGNOSIS — C7931 Secondary malignant neoplasm of brain: Secondary | ICD-10-CM

## 2015-09-27 DIAGNOSIS — C7949 Secondary malignant neoplasm of other parts of nervous system: Principal | ICD-10-CM

## 2015-09-27 NOTE — Op Note (Signed)
  Name: Carl Simmons  MRN: [id number]  Date: 09/27/2015   DOB: 1960-02-15  Stereotactic Radiosurgery Operative Note  PRE-OPERATIVE DIAGNOSIS:  Multiple Brain Metastases  POST-OPERATIVE DIAGNOSIS:  Multiple Brain Metastases  PROCEDURE:  Stereotactic Radiosurgery  SURGEON:  Peggyann Shoals, MD  NARRATIVE: The patient underwent a radiation treatment planning session in the radiation oncology simulation suite under the care of the radiation oncology physician and physicist.  I participated closely in the radiation treatment planning afterwards. The patient underwent planning CT which was fused to 3T high resolution MRI with 1 mm axial slices.  These images were fused on the planning system.  We contoured the gross target volumes and subsequently expanded this to yield the Planning Target Volume. I actively participated in the planning process.  I helped to define and review the target contours and also the contours of the optic pathway, eyes, brainstem and selected nearby organs at risk.  All the dose constraints for critical structures were reviewed and compared to AAPM Task Group 101.  The prescription dose conformity was reviewed.  I approved the plan electronically.    Accordingly, Carl Simmons was brought to the TrueBeam stereotactic radiation treatment linac and placed in the custom immobilization mask.  The patient was aligned according to the IR fiducial markers with BrainLab Exactrac, then orthogonal x-rays were used in ExacTrac with the 6DOF robotic table and the shifts were made to align the patient  Carl Simmons received stereotactic radiosurgery uneventfully.    Lesions treated:  10   Complex lesions treated:  0 (>3.5 cm, <52m of optic path, or within the brainstem)   The detailed description of the procedure is recorded in the radiation oncology procedure note.  I was present for the duration of the procedure.  DISPOSITION:  Following delivery, the patient was transported to  nursing in stable condition and monitored for possible acute effects to be discharged to home in stable condition with follow-up in one month.  SPeggyann Shoals MD 09/27/2015 5:42 PM

## 2015-09-27 NOTE — Progress Notes (Signed)
[place] Work  Clinical Social Work met with pt and wife briefly prior to [facility]. CSW had spoken to wife at length via phone last week and delivered prepared packet with resources and information to assist. Both were in good spirits today and were thankful for information. CSW reviewed packet and encouraged them both to reach out as needed.    Clinical Social Work interventions: Resource assistance Reassess needs Loren Racer, [place] Worker [place]  [place] Phone: [phone] Fax: [phone]

## 2015-09-27 NOTE — Progress Notes (Signed)
Nurse monitoring complete. Patient denies headache, dizziness, nausea, or diplopia. No distress noted. Patient without complaints. Patient understands to avoid strenuous activity for the next 24 hours. Patient understands to call [phone] with needs. Patient ambulated out of clinic accompanied by loved one. Mont Dutton provided patient with one month follow up appointment card.

## 2015-09-27 NOTE — Progress Notes (Signed)
Received patient in the clinic following SRS treatment for thirty minutes of nurse monitoring. Patient joking with nurse, navigator and wife. No distress noted. No complaints. Vitals stable. Denies pain. Will continue to monitor patient. Patient resting in geri chair.

## 2015-09-29 ENCOUNTER — Encounter (HOSPITAL_COMMUNITY): Payer: Self-pay | Admitting: Hematology & Oncology

## 2015-10-02 NOTE — Progress Notes (Signed)
  Radiation Oncology         [phone] ________________________________  Name: Carl Simmons MRN: [id number]  Date: 09/27/2015  DOB: 03/03/1960   SPECIAL TREATMENT PROCEDURE   3D TREATMENT PLANNING AND DOSIMETRY: The patient's radiation plan was reviewed and approved by Dr. Vertell Limber from neurosurgery and radiation oncology prior to treatment. It showed 3-dimensional radiation distributions overlaid onto the planning CT/MRI image set. The [facility] for the target structures as well as the organs at risk were reviewed. The documentation of the 3D plan and dosimetry are filed in the radiation oncology EMR.   NARRATIVE: The patient was brought to the TrueBeam stereotactic radiation treatment machine and placed supine on the CT couch. The head frame was applied, and the patient was set up for stereotactic radiosurgery. Neurosurgery was present for the set-up and delivery   SIMULATION VERIFICATION: In the couch zero-angle position, the patient underwent Exactrac imaging using the Brainlab system with orthogonal KV images. These were carefully aligned and repeated to confirm treatment position for each of the isocenters. The Exactrac snap film verification was repeated at each couch angle.   SPECIAL TREATMENT PROCEDURE: The patient received stereotactic radiosurgery to the following target:  PTV target 1-10 were treated using 2 treatment plans. A single isocenter technique was utilized to treat 8 lesion simultaneously. PTV's 2 and 5 were then treated simultaneously with a second radiation treatment plan due to their close proximity. A prescription dose of 20 Gy was delivered to each lesion. ExacTrac Snap verification was performed for each couch angle.   STEREOTACTIC TREATMENT MANAGEMENT: Following delivery, the patient was transported to nursing in stable condition and monitored for possible acute effects. Vital signs were recorded . The patient tolerated treatment without significant acute effects, and was  discharged to home in stable condition.  PLAN: Follow-up in one month.   ------------------------------------------------  Jodelle Gross, MD, PhD

## 2015-10-02 NOTE — Progress Notes (Addendum)
  Radiation Oncology         [phone] ________________________________  Name: Carl Simmons MRN: [id number]  Date: 09/19/2015  DOB: [birth year]  DIAGNOSIS:     ICD-9-CM ICD-10-CM   1. Brain metastases ([place]) 198.3 C79.31     NARRATIVE:  The patient was brought to the [facility].  Identity was confirmed.  All relevant records and images related to the planned course of therapy were reviewed.  The patient freely provided informed written consent to proceed with treatment after reviewing the details related to the planned course of therapy. The consent form was witnessed and verified by the simulation staff. Intravenous access was established for contrast administration. Then, the patient was set-up in a stable reproducible supine position for radiation therapy.  A relocatable thermoplastic stereotactic head frame was fabricated for precise immobilization.  CT images were obtained.  Surface markings were placed.  The CT images were loaded into the planning software and fused with the patient's targeting MRI scan.  Then the target and avoidance structures were contoured.  Treatment planning then occurred.  The radiation prescription was entered and confirmed.  I have requested 3D planning  I have requested a DVH of the following structures: Brain stem, brain, left eye, right eye, lenses, optic chiasm, target volumes, uninvolved brain, and normal tissue.    SPECIAL TREATMENT PROCEDURE:  The planned course of therapy using radiation constitutes a special treatment procedure. Special care is required in the management of this patient for the following reasons. This treatment constitutes a Special Treatment Procedure for the following reason: High dose per fraction requiring special monitoring for increased toxicities of treatment including daily imaging.  The special nature of the planned course of radiotherapy will require increased physician supervision and oversight to ensure patient's  safety with optimal treatment outcomes.  PLAN:  The patient will receive 20 Gy in 1 fraction to each of the target lesions.   ------------------------------------------------  Jodelle Gross, MD, PhD

## 2015-10-02 NOTE — Progress Notes (Signed)
  Radiation Oncology         [phone] ________________________________  Name: Carl Simmons MRN: [id number]  Date: 09/27/2015  DOB: 1961-01-16  End of Treatment Note  Diagnosis:      ICD-9-CM ICD-10-CM   1. Brain metastases ([place]) 198.3 C79.31         Indication for treatment:  palliative       Radiation treatment dates:   09/27/2015  Site/dose:    PTV target 1-10 were treated using 2 treatment plans. A single isocenter technique was utilized to treat 8 lesion simultaneously. PTV's 2 and 5 were then treated simultaneously with a second radiation treatment plan due to their close proximity. A prescription dose of 20 Gy was delivered to each lesion. ExacTrac Snap verification was performed for each couch angle.   Narrative: The patient tolerated radiation treatment well.   There were no signs of acute toxicity after treatment.  Plan: The patient has completed radiation treatment. The patient will return to radiation oncology clinic for routine followup in one month. I advised the patient to call or return sooner if they have any questions or concerns related to their recovery or treatment. ________________________________  ------------------------------------------------  Jodelle Gross, MD, PhD

## 2015-10-10 ENCOUNTER — Encounter (HOSPITAL_BASED_OUTPATIENT_CLINIC_OR_DEPARTMENT_OTHER): Payer: BLUE CROSS/BLUE SHIELD

## 2015-10-10 ENCOUNTER — Encounter (HOSPITAL_COMMUNITY): Payer: BLUE CROSS/BLUE SHIELD | Attending: Hematology & Oncology | Admitting: Oncology

## 2015-10-10 ENCOUNTER — Ambulatory Visit (HOSPITAL_COMMUNITY): Payer: BLUE CROSS/BLUE SHIELD

## 2015-10-10 ENCOUNTER — Encounter (HOSPITAL_COMMUNITY): Payer: BLUE CROSS/BLUE SHIELD

## 2015-10-10 ENCOUNTER — Encounter (HOSPITAL_COMMUNITY): Payer: Self-pay | Admitting: Oncology

## 2015-10-10 VITALS — BP 105/79 | HR 86 | Temp 97.9°F | Resp 18 | Wt 238.9 lb

## 2015-10-10 DIAGNOSIS — Z23 Encounter for immunization: Secondary | ICD-10-CM | POA: Diagnosis not present

## 2015-10-10 DIAGNOSIS — C792 Secondary malignant neoplasm of skin: Secondary | ICD-10-CM

## 2015-10-10 DIAGNOSIS — C3491 Malignant neoplasm of unspecified part of right bronchus or lung: Secondary | ICD-10-CM

## 2015-10-10 DIAGNOSIS — C7931 Secondary malignant neoplasm of brain: Secondary | ICD-10-CM | POA: Diagnosis not present

## 2015-10-10 DIAGNOSIS — Z5112 Encounter for antineoplastic immunotherapy: Secondary | ICD-10-CM | POA: Diagnosis not present

## 2015-10-10 DIAGNOSIS — Z5189 Encounter for other specified aftercare: Secondary | ICD-10-CM

## 2015-10-10 DIAGNOSIS — C7951 Secondary malignant neoplasm of bone: Secondary | ICD-10-CM | POA: Diagnosis not present

## 2015-10-10 LAB — COMPREHENSIVE METABOLIC PANEL
ALBUMIN: 3.9 g/dL (ref 3.5–5.0)
ALK PHOS: 73 U/L (ref 38–126)
ALT: 21 U/L (ref 17–63)
ANION GAP: 10 (ref 5–15)
AST: 26 U/L (ref 15–41)
BUN: 11 mg/dL (ref 6–20)
CALCIUM: 9.3 mg/dL (ref 8.9–10.3)
CHLORIDE: 100 mmol/L — AB (ref 101–111)
CO2: 25 mmol/L (ref 22–32)
Creatinine, Ser: 0.75 mg/dL (ref 0.61–1.24)
GFR calc Af Amer: >60 mL/min (ref >60)
GFR calc non Af Amer: >60 mL/min (ref >60)
GLUCOSE: 90 mg/dL (ref 65–99)
Potassium: 4.2 mmol/L (ref 3.5–5.1)
SODIUM: 135 mmol/L (ref 135–145)
Total Bilirubin: 0.5 mg/dL (ref 0.3–1.2)
Total Protein: 7.7 g/dL (ref 6.5–8.1)

## 2015-10-10 LAB — CBC WITH DIFFERENTIAL/PLATELET
BASOS PCT: 1 %
Basophils Absolute: 0.1 10*3/uL (ref 0.0–0.1)
EOS ABS: 0.2 10*3/uL (ref 0.0–0.7)
EOS PCT: 2 %
HCT: 47.4 % (ref 39.0–52.0)
HEMOGLOBIN: 16.2 g/dL (ref 13.0–17.0)
LYMPHS ABS: 2.9 10*3/uL (ref 0.7–4.0)
LYMPHS PCT: 30 %
MCH: 33.1 pg (ref 26.0–34.0)
MCHC: 34.2 g/dL (ref 30.0–36.0)
MCV: 96.9 fL (ref 78.0–100.0)
Monocytes Absolute: 0.9 10*3/uL (ref 0.1–1.0)
Monocytes Relative: 9 %
Neutro Abs: 5.7 10*3/uL (ref 1.7–7.7)
Neutrophils Relative %: 58 %
PLATELETS: 415 10*3/uL — AB (ref 150–400)
RBC: 4.89 MIL/uL (ref 4.22–5.81)
RDW: 12.3 % (ref 11.5–15.5)
WBC: 9.8 10*3/uL (ref 4.0–10.5)

## 2015-10-10 LAB — TSH: TSH: 0.823 u[IU]/mL (ref 0.350–4.500)

## 2015-10-10 MED ORDER — SODIUM CHLORIDE 0.9 % IV SOLN
Freq: Once | INTRAVENOUS | Status: AC
  Administered 2015-10-10: 13:00:00 via INTRAVENOUS

## 2015-10-10 MED ORDER — SODIUM CHLORIDE 0.9% FLUSH
10.0000 mL | INTRAVENOUS | Status: DC | PRN
  Administered 2015-10-10: 10 mL
  Filled 2015-10-10: qty 10

## 2015-10-10 MED ORDER — INFLUENZA VAC SPLIT QUAD 0.5 ML IM SUSY
0.5000 mL | PREFILLED_SYRINGE | Freq: Once | INTRAMUSCULAR | Status: AC
  Administered 2015-10-10: 0.5 mL via INTRAMUSCULAR
  Filled 2015-10-10: qty 0.5

## 2015-10-10 MED ORDER — DENOSUMAB 120 MG/1.7ML ~~LOC~~ SOLN
120.0000 mg | Freq: Once | SUBCUTANEOUS | Status: AC
  Administered 2015-10-10: 120 mg via SUBCUTANEOUS
  Filled 2015-10-10: qty 1.7

## 2015-10-10 MED ORDER — HEPARIN SOD (PORK) LOCK FLUSH 100 UNIT/ML IV SOLN
500.0000 [IU] | Freq: Once | INTRAVENOUS | Status: AC | PRN
  Administered 2015-10-10: 500 [IU]
  Filled 2015-10-10: qty 5

## 2015-10-10 MED ORDER — SODIUM CHLORIDE 0.9 % IV SOLN
240.0000 mg | Freq: Once | INTRAVENOUS | Status: AC
  Administered 2015-10-10: 240 mg via INTRAVENOUS
  Filled 2015-10-10: qty 4

## 2015-10-10 MED ORDER — STERILE WATER FOR INJECTION IJ SOLN
INTRAMUSCULAR | Status: AC
  Filled 2015-10-10: qty 10

## 2015-10-10 MED ORDER — ALTEPLASE 2 MG IJ SOLR
2.0000 mg | Freq: Once | INTRAMUSCULAR | Status: AC | PRN
  Administered 2015-10-10: 2 mg
  Filled 2015-10-10: qty 2

## 2015-10-10 NOTE — Progress Notes (Signed)
Carl Simmons, [place] [address]  Adenocarcinoma of right lung [facility]) - Plan: NM PET Image Restag (PS) Skull Base To Thigh, CANCELED: CT Abdomen Pelvis W Contrast, CANCELED: CT Chest W Contrast, CANCELED: NM Bone Scan Whole Body  CURRENT THERAPY: Opdivo beginning on 08/29/2015 in the salvage setting with Xgeva monthly beginning on 08/29/2015.  INTERVAL HISTORY: Carl Simmons 55 y.o. 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  Treated by Dr. Tressie Stalker at [facility] with Carboplatin/Pemetrexed every 21 days x 6 cycles (12/10/2014- 03/25/2015) with transition to maintenance Pemetrexed every 21 days with last dose being administered on 08/01/2015 with imaging following demonstrating progression of disease, resulting in a change in therapy to [place] beginning on 08/29/2015.  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 Lisbeth Renshaw.    Adenocarcinoma of right lung ([place])   11/20/2014 Procedure    Right scalp skin biopsy      11/20/2014 Pathology Results    Adenocarcinoma of lung primary. PDL-1 NEGATIVE (0% tumor proportion score); EGFR mutation NEGATIVE; no evidence of ALK gene rearrangement detected by FISH; no reaarnagement of ROS1 gene detected.      11/29/2014 PET scan    Large hypermetabolic mass within the RLL with central necrosis. Hypermetabolic right suprahilar mass with postobstructive collapse of the RUL. Subcarinal, paratracheal, prevascular and R internal mammary nodal metastases. Small R adrenal gland nodule.      12/10/2014 - 03/25/2015 Chemotherapy    Carboplatin and pemetrexed 6 cycles      02/11/2015 PET scan    Partial metabolic response to therapy, with decrease in hypermetabolic right lung masses, postobstructive right upper lobe consolidation, thoracic lymphadenopathy and right adrenal metastasis. No new or progressive disease identified.      04/05/2015 PET scan    No significant change in hypermetabolic right lung masses and thoracic lymphadenopathy. No new or progressive disease identified.      04/15/2015 - 08/01/2015 Chemotherapy    Pemetrexed maintenance      08/14/2015 PET scan    Overall progression of metastatic lung cancer. Increasingly hypermetabolic mediastinal lymph nodes and R lung masses, enlarging R adrenal mass and new osseous lesions. Enlarging hypermetabolic R-sided scalp nodules.      08/14/2015 Progression    Progression of disease on PET imaging      08/29/2015 -  Chemotherapy    Opdivo      08/29/2015 Miscellaneous    Xgeva started monthly      09/05/2015 Imaging    MRI brain- Multiple metastatic deposits in the brain. At least 9 lesions are identified. Mild edema around the 10 mm lesion in the right cerebellum.      09/27/2015 - 09/27/2015 Radiation Therapy    Dr. Lisbeth Renshaw- SRS to brain mets:  PTV target 1-10 were treated using 2 treatment plans. A single isocenter technique was utilized to treat 8 lesion simultaneously. PTV's 2 and 5 were then treated simultaneously with a second radiation treatment plan due to their close proximity. A prescription dose of 20 Gy was delivered to each lesion. ExacTrac Snap verification was performed for each couch angle.       He continues to tolerate therapy without any complaints. Opdivo side effects:  Lung problems (pneumonitis):   Shortness of breath   Chest pain    New or worse cough  Intestinal problems (colitis):  Diarrhea or more bowel movements than usual   Stools that are black, tarry, sticky, or have bloodwork mucous   Severe stomach (abdominal) pain or tenderness  Liver problems (hepatitis):   Yellowing of skin or the sclera of eyes   Nausea or vomiting   Pain on the right side of abdomen   Dark urine   Feeling less hungry than usual   Bleeding or bruising more easily than normal  Hormone gland problems (thyroid, pituitary, adrenal glands, and  pancreas):   Rapid heartbeat   Weight loss or weight gain   Increased sweating   Feeling more hungry or thirsty   Urinating more than usual   Hair loss   Feeling cold   Constipation   Voice changes (voice getting deeper)   Muscle aches   Dizziness or fainting   Headaches that will not go away, or unusual headaches  Kidney problems (nephritis and kidney failure):   Change in amount of urine or color of urine   Problems and other organs:   Rash   Change in eyesight   Severe or persistent muscle or joint pains   Severe muscle weakness  Infusion (IV) reactions:   Chills or shaking   Shortness of breath or wheezing   Itching or rash   Flushing   Dizziness   Fevers   Feeling like passing out   Review of Systems  Constitutional: Negative.  Negative for chills, fever and weight loss.  HENT: Negative.   Eyes: Negative.  Negative for blurred vision and double vision.  Respiratory: Positive for shortness of breath (Chronic, at baseline). Negative for cough.   Cardiovascular: Negative.  Negative for chest pain.  Gastrointestinal: Negative.  Negative for abdominal pain, constipation, diarrhea, nausea and vomiting.  Genitourinary: Negative.   Musculoskeletal: Negative.  Negative for falls.  Skin: Negative.  Negative for rash.  Neurological: Negative.  Negative for dizziness, sensory change, focal weakness, loss of consciousness and weakness.  Endo/Heme/Allergies: Negative.   Psychiatric/Behavioral: Negative.     Past Medical History:  Diagnosis Date  . Adenocarcinoma of right lung ([place]) 07/11/2015  . Bone metastasis ([place]) 08/24/2015  . Cirrhosis of liver ([place]) 08/14/2015   PET on 08/14/2015 demonstrates cirrhotic liver.     Past Surgical History:  Procedure Laterality Date  . right power port placement Right    right subclavian    Family History  Problem Relation Age of Onset  . Cancer Father     thyroid cancer  . Lupus Sister   . Cancer Sister     metastatic ovarian     Social History   Social History  . Marital status: Married    Spouse name: N/A  . Number of children: N/A  . Years of education: N/A   Social History Main Topics  . Smoking status: Current Every Day Smoker    Packs/day: 2.00  . Smokeless tobacco: Never Used  . Alcohol use No  . Drug use: No  . Sexual activity: Not Asked   Other Topics Concern  . None   Social History Narrative  . None     PHYSICAL EXAMINATION  ECOG PERFORMANCE STATUS: 1 - Symptomatic but completely ambulatory  There were no vitals filed for this visit.  BP 107/89 Pulse 102 Respirations 20 Temperature 97.6 Oxygen saturation 98%  GENERAL:alert, no distress, well nourished, well developed, comfortable, cooperative, obese, smiling, in chemotherapy recliner, and accompanied by his wife.   SKIN: skin color, texture, turgor are normal, no rashes or significant lesions  HEAD: Normocephalic, No masses, lesions, tenderness or abnormalities EYES: normal, EOMI, Conjunctiva are pink and non-injected EARS: External ears normal OROPHARYNX:lips, buccal mucosa, and tongue normal and mucous membranes are moist  NECK: supple, trachea midline LYMPH:  no palpable lymphadenopathy BREAST:not examined LUNGS: clear to auscultation and percussion HEART: regular rate & rhythm, no murmurs, no gallops, S1 normal and S2 normal ABDOMEN:abdomen soft, non-tender, obese and normal bowel sounds BACK: Back symmetric, no curvature. EXTREMITIES:less then 2 second capillary refill, no joint deformities, effusion, or inflammation, no edema, no cyanosis  NEURO: alert & oriented x 3 with fluent speech, no focal motor/sensory deficits, gait normal   LABORATORY DATA: CBC    Component Value Date/Time   WBC 9.8 10/10/2015 1202   RBC 4.89 10/10/2015 1202   HGB 16.2 10/10/2015 1202   HCT 47.4 10/10/2015 1202   PLT 415 (H) 10/10/2015 1202   MCV 96.9 10/10/2015 1202   MCH 33.1 10/10/2015 1202   MCHC 34.2 10/10/2015 1202   RDW 12.3  10/10/2015 1202   LYMPHSABS 2.9 10/10/2015 1202   MONOABS 0.9 10/10/2015 1202   EOSABS 0.2 10/10/2015 1202   BASOSABS 0.1 10/10/2015 1202      Chemistry      Component Value Date/Time   NA 135 10/10/2015 1202   K 4.2 10/10/2015 1202   CL 100 (L) 10/10/2015 1202   CO2 25 10/10/2015 1202   BUN 11 10/10/2015 1202   CREATININE 0.75 10/10/2015 1202      Component Value Date/Time   CALCIUM 9.3 10/10/2015 1202   ALKPHOS 73 10/10/2015 1202   AST 26 10/10/2015 1202   ALT 21 10/10/2015 1202   BILITOT 0.5 10/10/2015 1202     Lab Results  Component Value Date   TSH 0.823 10/10/2015     PENDING LABS:   RADIOGRAPHIC STUDIES:  Mr [name] OI Contrast  Result Date: 09/16/2015 CLINICAL DATA:  Radiation planning. History of metastatic lung cancer with bone metastasis, recent scalp biopsy. Follow-up metaphysis. EXAM: MRI HEAD WITHOUT AND WITH CONTRAST TECHNIQUE: Multiplanar, multiecho pulse sequences of the brain and surrounding structures were obtained without and with intravenous contrast. CONTRAST:  45m MULTIHANCE GADOBENATE DIMEGLUMINE 529 MG/ML IV SOLN COMPARISON:  MRI of the brain September 05, 2015 FINDINGS: INTRACRANIAL CONTENTS: 7 infratentorial and at least 3 supratentorial ring-enhancing metastasis associated with FLAIR T2 hyperintense signal, largest within RIGHT cerebellum measuring 13 mm, stable from prior examination. Punctate focus of enhancement LEFT frontal lobe equivocal for cortical metastasis (axial post gadolinium image 115/160), unchanged. No new metastasis or interval progression of disease. No susceptibility artifact to suggest hemorrhage. Ventricles and sulci are overall normal for patient's age. No midline shift. Minimal white matter changes exclusive of the aforementioned abnormality compatible with chronic small vessel ischemic disease, normal for patient's age. Old small RIGHT cerebellar infarct. No abnormal extra-axial fluid collections nor abnormal extra-axial  enhancement or mass. ORBITS: The included ocular globes and orbital contents are non-suspicious. SINUSES: Small LEFT mastoid effusion. Paranasal sinus are well aerated. SKULL/SOFT TISSUES: No abnormal sellar expansion. Re- demonstration of RIGHT temporal occipital lobe scalp metastasis and osseous invasion. Craniocervical junction maintained. Thornwaldt versus nasopharyngeal mucosal retention cyst. Patient is edentulous. Heterogeneous clivus is similar. IMPRESSION: Seven infratentorial and at least 3 supratentorial metastasis. No interval disease progression. RIGHT temporal occipital scalp metastasis with calvarial invasion. Minimal white matter changes compatible with chronic small vessel ischemic disease. Old small RIGHT cerebellar infarct. Electronically Signed   By: CElon AlasM.D.   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  Treated by Dr. Tressie Stalker at [facility] with Carboplatin/Pemetrexed every 21 days x 6 cycles (12/10/2014- 03/25/2015) with transition to maintenance Pemetrexed every 21 days with last dose being administered on 08/01/2015 with imaging following demonstrating progression of disease, resulting in a change in therapy to [place] beginning on 08/29/2015.  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 Lisbeth Renshaw.  Oncology history is updated.  Pre-treatment labs today: CBC diff, CMET.  I personally reviewed and went over laboratory results with the patient.  The results are noted within this dictation.    He is undergoing EBRT in [place].  Order is placed for PET to evaluate response in ~ 3 weeks.  Return in 2 weeks for next cycle of treatment and return in 4 weeks for follow-up and review of restaging imaging.   ORDERS PLACED FOR THIS ENCOUNTER: Orders Placed This Encounter  Procedures  . NM PET Image Restag (PS)  Skull Base To Thigh    MEDICATIONS PRESCRIBED THIS ENCOUNTER: No orders of the defined types were placed in this encounter.   THERAPY PLAN:  Continue treatment as planned.    All questions were answered. The patient knows to call the clinic with any problems, questions or concerns. We can certainly see the patient much sooner if necessary.  Patient and plan discussed with Dr. Ancil Linsey and she is in agreement with the aforementioned.   This note is electronically signed by: Doy Mince 10/10/2015 10:56 PM

## 2015-10-10 NOTE — Assessment & Plan Note (Addendum)
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  Treated by Dr. Tressie Stalker at [facility] with Carboplatin/Pemetrexed every 21 days x 6 cycles (12/10/2014- 03/25/2015) with transition to maintenance Pemetrexed every 21 days with last dose being administered on 08/01/2015 with imaging following demonstrating progression of disease, resulting in a change in therapy to [place] beginning on 08/29/2015.  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 Lisbeth Renshaw.  Oncology history is updated.  Pre-treatment labs today: CBC diff, CMET.  I personally reviewed and went over laboratory results with the patient.  The results are noted within this dictation.    He is undergoing EBRT in [place].  Order is placed for PET to evaluate response in ~ 3 weeks.  Return in 2 weeks for next cycle of treatment and return in 4 weeks for follow-up and review of restaging imaging.

## 2015-10-10 NOTE — Progress Notes (Signed)
See other encounter.

## 2015-10-10 NOTE — Patient Instructions (Signed)
[facility] Discharge Instructions for Patients Receiving Chemotherapy   Beginning January 23rd 2017 lab work for the [facility] will be done in the  Main lab at [facility] on 1st floor. If you have a lab appointment with the [facility] please come in thru the  Main Entrance and check in at the main information desk   Today you received the following chemotherapy agents Opdivo as well as Xgeva and Influenza injections. Follow-up as scheduled. Call clinic for any questions or concerns  To help prevent nausea and vomiting after your treatment, we encourage you to take your nausea medication   If you develop nausea and vomiting, or diarrhea that is not controlled by your medication, call the clinic.  The clinic phone number is [phone]. Office hours are Monday-Friday 8:30am-5:00pm.  BELOW ARE SYMPTOMS THAT SHOULD BE REPORTED IMMEDIATELY:  *FEVER GREATER THAN 101.0 F  *CHILLS WITH OR WITHOUT FEVER  NAUSEA AND VOMITING THAT IS NOT CONTROLLED WITH YOUR NAUSEA MEDICATION  *UNUSUAL SHORTNESS OF BREATH  *UNUSUAL BRUISING OR BLEEDING  TENDERNESS IN MOUTH AND THROAT WITH OR WITHOUT PRESENCE OF ULCERS  *URINARY PROBLEMS  *BOWEL PROBLEMS  UNUSUAL RASH Items with * indicate a potential emergency and should be followed up as soon as possible. If you have an emergency after office hours please contact your primary care physician or go to the nearest emergency department.  Please call the clinic during office hours if you have any questions or concerns.   You may also contact the Patient Navigator at [phone] should you have any questions or need assistance in obtaining follow up care.      Resources For Cancer Patients and their Caregivers ? American Cancer Society: Can assist with transportation, wigs, general needs, runs Look Good Feel Better.        ([phone] ? Cancer Care: Provides financial assistance, online support groups,  medication/co-pay assistance.  1-800-813-HOPE 631-511-7377) ? [place] Assists [place] cancer patients and their families through emotional , educational and financial support.  (585)741-6414 ? [place] DSS Where to apply for food stamps, Medicaid and utility assistance. (832) 231-7901 ? RCATS: Transportation to medical appointments. 972 622 1972 ? Social Security Administration: May apply for disability if have a Stage IV cancer. (586) 576-4460 440-555-7778 ? [employer], Disability and Transit Services: Assists with nutrition, care and transit needs. [phone]

## 2015-10-10 NOTE — Patient Instructions (Addendum)
[place] at [facility] Discharge Instructions  RECOMMENDATIONS MADE BY THE CONSULTANT AND ANY TEST RESULTS WILL BE SENT TO YOUR REFERRING PHYSICIAN.  You were seen by Gershon Mussel today.  PET scan in 3 weeks Treatment in 2 weeks Follow up and treatment in 4 weeks Please call the center with any related concerns.  Thank you for choosing [place] at [facility] to provide your oncology and hematology care.  To afford each patient quality time with our provider, please arrive at least 15 minutes before your scheduled appointment time.   Beginning January 23rd 2017 lab work for the [facility] will be done in the  Main lab at [facility] on 1st floor. If you have a lab appointment with the [place] please come in thru the  Main Entrance and check in at the main information desk  You need to re-schedule your appointment should you arrive 10 or more minutes late.  We strive to give you quality time with our providers, and arriving late affects you and other patients whose appointments are after yours.  Also, if you no show three or more times for appointments you may be dismissed from the clinic at the providers discretion.     Again, thank you for choosing [facility]  Our hope is that these requests will decrease the amount of time that you wait before being seen by our physicians.       _____________________________________________________________  Should you have questions after your visit to [facility], please contact our office at [phone] between the hours of 8:30 a.m. and 4:30 p.m.  Voicemails left after 4:30 p.m. will not be returned until the following business day.  For prescription refill requests, have your pharmacy contact our office.         Resources For Cancer Patients and their Caregivers ? American Cancer Society: Can assist with transportation, wigs, general needs, runs Look Good Feel  Better.        (854) 544-2084 ? Cancer Care: Provides financial assistance, online support groups, medication/co-pay assistance.  1-800-813-HOPE 9517338202) ? [place] Assists [place] cancer patients and their families through emotional , educational and financial support.  (754) 296-1565 ? [place] DSS Where to apply for food stamps, Medicaid and utility assistance. (551) 705-7835 ? RCATS: Transportation to medical appointments. (830)387-4900 ? Social Security Administration: May apply for disability if have a Stage IV cancer. ([phone] 340-6938 518 192 4006 ? [employer], Disability and Transit Services: Assists with nutrition, care and transit needs. [place] Support Programs: '@10RELATIVEDAYS'$ @ > Cancer Support Group  2nd Tuesday of the month 1pm-2pm, Journey Room  > Creative Journey  3rd Tuesday of the month 1130am-1pm, Journey Room  > Look Good Feel Better  1st Wednesday of the month 10am-12 noon, Journey Room (Call [place] to register [phone])

## 2015-10-10 NOTE — Progress Notes (Signed)
Paschal Dopp tolerated chemo tx,Xgeva injection and Influenza vaccine well without complaints. Pt denied any tooth or jaw pain prior to administering Xgeva injection. Unable to obtain blood from port when accessed so Alteplase was used per protocol and indwelled for 30 mins with blood obtained at that time. VSS upon discharge. Pt discharged self ambulatory in satisfactory condition with wife

## 2015-10-24 ENCOUNTER — Encounter (HOSPITAL_BASED_OUTPATIENT_CLINIC_OR_DEPARTMENT_OTHER): Payer: BLUE CROSS/BLUE SHIELD

## 2015-10-24 ENCOUNTER — Encounter (HOSPITAL_COMMUNITY): Payer: Self-pay

## 2015-10-24 ENCOUNTER — Ambulatory Visit (HOSPITAL_COMMUNITY): Payer: BLUE CROSS/BLUE SHIELD

## 2015-10-24 VITALS — BP 118/77 | HR 88 | Temp 98.6°F | Resp 18 | Wt 241.6 lb

## 2015-10-24 DIAGNOSIS — C3491 Malignant neoplasm of unspecified part of right bronchus or lung: Secondary | ICD-10-CM | POA: Diagnosis not present

## 2015-10-24 DIAGNOSIS — C792 Secondary malignant neoplasm of skin: Secondary | ICD-10-CM

## 2015-10-24 DIAGNOSIS — Z5112 Encounter for antineoplastic immunotherapy: Secondary | ICD-10-CM

## 2015-10-24 LAB — COMPREHENSIVE METABOLIC PANEL
ALT: 25 U/L (ref 17–63)
ANION GAP: 7 (ref 5–15)
AST: 27 U/L (ref 15–41)
Albumin: 3.6 g/dL (ref 3.5–5.0)
Alkaline Phosphatase: 57 U/L (ref 38–126)
BUN: 10 mg/dL (ref 6–20)
CHLORIDE: 101 mmol/L (ref 101–111)
CO2: 28 mmol/L (ref 22–32)
Calcium: 8.8 mg/dL — ABNORMAL LOW (ref 8.9–10.3)
Creatinine, Ser: 0.8 mg/dL (ref 0.61–1.24)
GFR calc non Af Amer: >60 mL/min (ref >60)
Glucose, Bld: 131 mg/dL — ABNORMAL HIGH (ref 65–99)
Potassium: 3.7 mmol/L (ref 3.5–5.1)
SODIUM: 136 mmol/L (ref 135–145)
Total Bilirubin: 0.4 mg/dL (ref 0.3–1.2)
Total Protein: 7.1 g/dL (ref 6.5–8.1)

## 2015-10-24 LAB — CBC WITH DIFFERENTIAL/PLATELET
Basophils Absolute: 0.1 10*3/uL (ref 0.0–0.1)
Basophils Relative: 1 %
EOS ABS: 0.3 10*3/uL (ref 0.0–0.7)
EOS PCT: 3 %
HCT: 46.6 % (ref 39.0–52.0)
Hemoglobin: 15.7 g/dL (ref 13.0–17.0)
LYMPHS ABS: 3 10*3/uL (ref 0.7–4.0)
Lymphocytes Relative: 32 %
MCH: 32.6 pg (ref 26.0–34.0)
MCHC: 33.7 g/dL (ref 30.0–36.0)
MCV: 96.7 fL (ref 78.0–100.0)
MONOS PCT: 7 %
Monocytes Absolute: 0.7 10*3/uL (ref 0.1–1.0)
Neutro Abs: 5.4 10*3/uL (ref 1.7–7.7)
Neutrophils Relative %: 57 %
PLATELETS: 303 10*3/uL (ref 150–400)
RBC: 4.82 MIL/uL (ref 4.22–5.81)
RDW: 12.5 % (ref 11.5–15.5)
WBC: 9.5 10*3/uL (ref 4.0–10.5)

## 2015-10-24 MED ORDER — SODIUM CHLORIDE 0.9 % IV SOLN
240.0000 mg | Freq: Once | INTRAVENOUS | Status: AC
  Administered 2015-10-24: 240 mg via INTRAVENOUS
  Filled 2015-10-24: qty 20

## 2015-10-24 MED ORDER — HEPARIN SOD (PORK) LOCK FLUSH 100 UNIT/ML IV SOLN
500.0000 [IU] | Freq: Once | INTRAVENOUS | Status: AC | PRN
  Administered 2015-10-24: 500 [IU]

## 2015-10-24 MED ORDER — SODIUM CHLORIDE 0.9% FLUSH
10.0000 mL | INTRAVENOUS | Status: DC | PRN
  Administered 2015-10-24: 10 mL
  Filled 2015-10-24: qty 10

## 2015-10-24 MED ORDER — SODIUM CHLORIDE 0.9 % IV SOLN
Freq: Once | INTRAVENOUS | Status: AC
  Administered 2015-10-24: 12:00:00 via INTRAVENOUS

## 2015-10-24 NOTE — Progress Notes (Signed)
Labs reviewed with MD, proceed with chemo.  Chemotherapy given today per orders. Vitals stable and discharged from clinic ambulatory with wife.

## 2015-10-24 NOTE — Patient Instructions (Signed)
[facility] Discharge Instructions for Patients Receiving Chemotherapy   Beginning January 23rd 2017 lab work for the [facility] will be done in the  Main lab at [facility] on 1st floor. If you have a lab appointment with the [facility] please come in thru the  Main Entrance and check in at the main information desk   Today you received the following chemotherapy agents opdivo  To help prevent nausea and vomiting after your treatment, we encourage you to take your nausea medication     If you develop nausea and vomiting, or diarrhea that is not controlled by your medication, call the clinic.  The clinic phone number is [phone]. Office hours are Monday-Friday 8:30am-5:00pm.  BELOW ARE SYMPTOMS THAT SHOULD BE REPORTED IMMEDIATELY:  *FEVER GREATER THAN 101.0 F  *CHILLS WITH OR WITHOUT FEVER  NAUSEA AND VOMITING THAT IS NOT CONTROLLED WITH YOUR NAUSEA MEDICATION  *UNUSUAL SHORTNESS OF BREATH  *UNUSUAL BRUISING OR BLEEDING  TENDERNESS IN MOUTH AND THROAT WITH OR WITHOUT PRESENCE OF ULCERS  *URINARY PROBLEMS  *BOWEL PROBLEMS  UNUSUAL RASH Items with * indicate a potential emergency and should be followed up as soon as possible. If you have an emergency after office hours please contact your primary care physician or go to the nearest emergency department.  Please call the clinic during office hours if you have any questions or concerns.   You may also contact the Patient Navigator at [phone] should you have any questions or need assistance in obtaining follow up care.      Resources For Cancer Patients and their Caregivers ? American Cancer Society: Can assist with transportation, wigs, general needs, runs Look Good Feel Better.        641-683-2215 ? Cancer Care: Provides financial assistance, online support groups, medication/co-pay assistance.  1-800-813-HOPE 321 502 9069) ? [place] Assists [place]  cancer patients and their families through emotional , educational and financial support.  513-680-2122 ? [place] DSS Where to apply for food stamps, Medicaid and utility assistance. (708)258-4845 ? RCATS: Transportation to medical appointments. 607-295-3005 ? Social Security Administration: May apply for disability if have a Stage IV cancer. 214-378-0748 5310681440 ? [employer], Disability and Transit Services: Assists with nutrition, care and transit needs. [phone]

## 2015-10-31 ENCOUNTER — Ambulatory Visit (HOSPITAL_COMMUNITY)
Admission: RE | Admit: 2015-10-31 | Discharge: 2015-10-31 | Disposition: A | Discharge status: Home / Self Care | Payer: BLUE CROSS/BLUE SHIELD | Source: Ambulatory Visit | Attending: Oncology | Admitting: Oncology

## 2015-10-31 DIAGNOSIS — C3491 Malignant neoplasm of unspecified part of right bronchus or lung: Secondary | ICD-10-CM | POA: Insufficient documentation

## 2015-10-31 DIAGNOSIS — C7971 Secondary malignant neoplasm of right adrenal gland: Secondary | ICD-10-CM | POA: Diagnosis not present

## 2015-10-31 DIAGNOSIS — S2231XA Fracture of one rib, right side, initial encounter for closed fracture: Secondary | ICD-10-CM | POA: Diagnosis not present

## 2015-10-31 DIAGNOSIS — X58XXXA Exposure to other specified factors, initial encounter: Secondary | ICD-10-CM | POA: Diagnosis not present

## 2015-10-31 LAB — GLUCOSE, CAPILLARY: GLUCOSE-CAPILLARY: 111 mg/dL — AB (ref 65–99)

## 2015-10-31 MED ORDER — FLUDEOXYGLUCOSE F - 18 (FDG) INJECTION
12.1100 | Freq: Once | INTRAVENOUS | Status: AC | PRN
  Administered 2015-10-31: 12.11 via INTRAVENOUS

## 2015-11-07 ENCOUNTER — Encounter (HOSPITAL_COMMUNITY): Payer: BLUE CROSS/BLUE SHIELD | Attending: Hematology & Oncology

## 2015-11-07 ENCOUNTER — Ambulatory Visit (HOSPITAL_COMMUNITY): Payer: BLUE CROSS/BLUE SHIELD

## 2015-11-07 ENCOUNTER — Encounter (HOSPITAL_BASED_OUTPATIENT_CLINIC_OR_DEPARTMENT_OTHER): Payer: BLUE CROSS/BLUE SHIELD | Admitting: Hematology & Oncology

## 2015-11-07 ENCOUNTER — Encounter (HOSPITAL_COMMUNITY): Payer: Self-pay | Admitting: Hematology & Oncology

## 2015-11-07 ENCOUNTER — Encounter (HOSPITAL_COMMUNITY): Payer: BLUE CROSS/BLUE SHIELD

## 2015-11-07 VITALS — BP 126/88 | HR 107 | Temp 97.8°F | Resp 20 | Wt 242.0 lb

## 2015-11-07 VITALS — BP 111/73 | HR 79 | Temp 97.4°F | Resp 18

## 2015-11-07 DIAGNOSIS — Z5112 Encounter for antineoplastic immunotherapy: Secondary | ICD-10-CM

## 2015-11-07 DIAGNOSIS — C7931 Secondary malignant neoplasm of brain: Secondary | ICD-10-CM

## 2015-11-07 DIAGNOSIS — C3491 Malignant neoplasm of unspecified part of right bronchus or lung: Secondary | ICD-10-CM

## 2015-11-07 DIAGNOSIS — Z720 Tobacco use: Secondary | ICD-10-CM

## 2015-11-07 DIAGNOSIS — C7951 Secondary malignant neoplasm of bone: Secondary | ICD-10-CM

## 2015-11-07 DIAGNOSIS — IMO0001 Reserved for inherently not codable concepts without codable children: Secondary | ICD-10-CM

## 2015-11-07 DIAGNOSIS — Z7189 Other specified counseling: Secondary | ICD-10-CM

## 2015-11-07 DIAGNOSIS — Z5111 Encounter for antineoplastic chemotherapy: Secondary | ICD-10-CM | POA: Diagnosis not present

## 2015-11-07 DIAGNOSIS — C792 Secondary malignant neoplasm of skin: Secondary | ICD-10-CM | POA: Diagnosis not present

## 2015-11-07 LAB — CBC WITH DIFFERENTIAL/PLATELET
BASOS ABS: 0 10*3/uL (ref 0.0–0.1)
Basophils Relative: 0 %
EOS PCT: 2 %
Eosinophils Absolute: 0.3 10*3/uL (ref 0.0–0.7)
HEMATOCRIT: 48.4 % (ref 39.0–52.0)
Hemoglobin: 16.6 g/dL (ref 13.0–17.0)
LYMPHS PCT: 29 %
Lymphs Abs: 3.3 10*3/uL (ref 0.7–4.0)
MCH: 32.7 pg (ref 26.0–34.0)
MCHC: 34.3 g/dL (ref 30.0–36.0)
MCV: 95.3 fL (ref 78.0–100.0)
MONO ABS: 0.8 10*3/uL (ref 0.1–1.0)
MONOS PCT: 7 %
NEUTROS ABS: 6.7 10*3/uL (ref 1.7–7.7)
Neutrophils Relative %: 62 %
PLATELETS: 309 10*3/uL (ref 150–400)
RBC: 5.08 MIL/uL (ref 4.22–5.81)
RDW: 12.5 % (ref 11.5–15.5)
WBC: 11.1 10*3/uL — ABNORMAL HIGH (ref 4.0–10.5)

## 2015-11-07 LAB — TSH: TSH: 1.297 u[IU]/mL (ref 0.350–4.500)

## 2015-11-07 LAB — COMPREHENSIVE METABOLIC PANEL
ALT: 22 U/L (ref 17–63)
ANION GAP: 8 (ref 5–15)
AST: 24 U/L (ref 15–41)
Albumin: 3.8 g/dL (ref 3.5–5.0)
Alkaline Phosphatase: 54 U/L (ref 38–126)
BILIRUBIN TOTAL: 0.5 mg/dL (ref 0.3–1.2)
BUN: 14 mg/dL (ref 6–20)
CHLORIDE: 100 mmol/L — AB (ref 101–111)
CO2: 27 mmol/L (ref 22–32)
Calcium: 9.5 mg/dL (ref 8.9–10.3)
Creatinine, Ser: 0.76 mg/dL (ref 0.61–1.24)
Glucose, Bld: 129 mg/dL — ABNORMAL HIGH (ref 65–99)
POTASSIUM: 3.9 mmol/L (ref 3.5–5.1)
Sodium: 135 mmol/L (ref 135–145)
TOTAL PROTEIN: 7.3 g/dL (ref 6.5–8.1)

## 2015-11-07 MED ORDER — DENOSUMAB 120 MG/1.7ML ~~LOC~~ SOLN
120.0000 mg | Freq: Once | SUBCUTANEOUS | Status: AC
  Administered 2015-11-07: 120 mg via SUBCUTANEOUS
  Filled 2015-11-07: qty 1.7

## 2015-11-07 MED ORDER — ACETAMINOPHEN 325 MG PO TABS
650.0000 mg | ORAL_TABLET | Freq: Once | ORAL | Status: AC
  Administered 2015-11-07: 650 mg via ORAL
  Filled 2015-11-07: qty 2

## 2015-11-07 MED ORDER — DIPHENHYDRAMINE HCL 50 MG/ML IJ SOLN
50.0000 mg | Freq: Once | INTRAMUSCULAR | Status: AC
  Administered 2015-11-07: 50 mg via INTRAVENOUS
  Filled 2015-11-07: qty 1

## 2015-11-07 MED ORDER — HEPARIN SOD (PORK) LOCK FLUSH 100 UNIT/ML IV SOLN
500.0000 [IU] | Freq: Once | INTRAVENOUS | Status: AC | PRN
  Administered 2015-11-07: 500 [IU]
  Filled 2015-11-07 (×2): qty 5

## 2015-11-07 MED ORDER — PEGFILGRASTIM 6 MG/0.6ML ~~LOC~~ PSKT
6.0000 mg | PREFILLED_SYRINGE | Freq: Once | SUBCUTANEOUS | Status: AC
  Administered 2015-11-07: 6 mg via SUBCUTANEOUS
  Filled 2015-11-07: qty 0.6

## 2015-11-07 MED ORDER — SODIUM CHLORIDE 0.9% FLUSH
10.0000 mL | INTRAVENOUS | Status: DC | PRN
  Administered 2015-11-07: 10 mL
  Filled 2015-11-07: qty 10

## 2015-11-07 MED ORDER — SODIUM CHLORIDE 0.9 % IV SOLN
Freq: Once | INTRAVENOUS | Status: AC
  Administered 2015-11-07: 12:00:00 via INTRAVENOUS

## 2015-11-07 MED ORDER — SODIUM CHLORIDE 0.9 % IV SOLN
10.0000 mg | Freq: Once | INTRAVENOUS | Status: AC
  Administered 2015-11-07: 10 mg via INTRAVENOUS
  Filled 2015-11-07: qty 1

## 2015-11-07 MED ORDER — RAMUCIRUMAB CHEMO INJECTION 500 MG/50ML
10.0000 mg/kg | Freq: Once | INTRAVENOUS | Status: AC
  Administered 2015-11-07: 1100 mg via INTRAVENOUS
  Filled 2015-11-07: qty 50

## 2015-11-07 MED ORDER — DEXAMETHASONE 4 MG PO TABS: 8.0000 mg | ORAL_TABLET | Freq: Two times a day (BID) | ORAL | 1 refills | Status: DC

## 2015-11-07 MED ORDER — DOCETAXEL CHEMO INJECTION 160 MG/16ML
75.0000 mg/m2 | Freq: Once | INTRAVENOUS | Status: AC
  Administered 2015-11-07: 170 mg via INTRAVENOUS
  Filled 2015-11-07: qty 17

## 2015-11-07 NOTE — Progress Notes (Signed)
Carl Simmons, [place] [address]  DIAGNOSIS:    Adenocarcinoma of right lung [facility])   11/20/2014 Procedure    Right scalp skin biopsy      11/20/2014 Pathology Results    Adenocarcinoma of lung primary. PDL-1 NEGATIVE (0% tumor proportion score); EGFR mutation NEGATIVE; no evidence of ALK gene rearrangement detected by FISH; no reaarnagement of ROS1 gene detected.      11/29/2014 PET scan    Large hypermetabolic mass within the RLL with central necrosis. Hypermetabolic right suprahilar mass with postobstructive collapse of the RUL. Subcarinal, paratracheal, prevascular and R internal mammary nodal metastases. Small R adrenal gland nodule.      12/10/2014 - 03/25/2015 Chemotherapy    Carboplatin and pemetrexed 6 cycles      02/11/2015 PET scan    Partial metabolic response to therapy, with decrease in hypermetabolic right lung masses, postobstructive right upper lobe consolidation, thoracic lymphadenopathy and right adrenal metastasis. No new or progressive disease identified.      04/05/2015 PET scan    No significant change in hypermetabolic right lung masses and thoracic lymphadenopathy. No new or progressive disease identified.      04/15/2015 - 08/01/2015 Chemotherapy    Pemetrexed maintenance      08/14/2015 PET scan    Overall progression of metastatic lung cancer. Increasingly hypermetabolic mediastinal lymph nodes and R lung masses, enlarging R adrenal mass and new osseous lesions. Enlarging hypermetabolic R-sided scalp nodules.      08/14/2015 Progression    Progression of disease on PET imaging      08/29/2015 - 10/24/2015 Chemotherapy    Opdivo      08/29/2015 Miscellaneous    Xgeva started monthly      09/05/2015 Imaging    MRI brain- Multiple metastatic deposits in the brain. At least 9 lesions are identified. Mild edema around the 10 mm lesion in the right cerebellum.      09/27/2015 - 09/27/2015 Radiation Therapy    Dr. Lisbeth Renshaw-  SRS to brain mets:  PTV target 1-10 were treated using 2 treatment plans. A single isocenter technique was utilized to treat 8 lesion simultaneously. PTV's 2 and 5 were then treated simultaneously with a second radiation treatment plan due to their close proximity. A prescription dose of 20 Gy was delivered to each lesion. ExacTrac Snap verification was performed for each couch angle.       10/31/2015 PET scan    1. Overall there has been interval progression of disease compared with the previous exam. 2. There is an increased FDG uptake associated with the dominant mass involving the right lower lobe. Additionally, there are new hypermetabolic pre-vascular and right posterior cervical lymph nodes. 3. Significant interval increase in FDG uptake associated with right upper lobe pulmonary nodule and right adrenal gland metastases. 4. New hypermetabolic metastasis is involving the left hip. Additionally, there is intense uptake associated with fractured right rib. Underlying lesion not excluded. 5. Increase in FDG uptake associated with lesion involving the posterior nasopharynx.      10/31/2015 Progression    Progression of disease      11/07/2015 Treatment Plan Change    D/C Opdivo and change to Docetaxel + Ramucirumab      11/07/2015 -  Chemotherapy    Docetaxel + Ramucirumab        CURRENT THERAPY: Opdivo beginning on 08/29/2015 in the salvage setting with Xgeva monthly beginning on 08/29/2015.  INTERVAL HISTORY: Carl  Carl Simmons 55 y.o. 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  Treated by Dr. Tressie Stalker at [facility] with Carboplatin/Pemetrexed every 21 days x 6 cycles (12/10/2014- 03/25/2015) with transition to maintenance Pemetrexed every 21 days with last dose being administered on 08/01/2015 with imaging following demonstrating progression of disease, resulting in a change in  therapy to [place] beginning on 08/29/2015. He has completed stereotactic XRT in [place].   Patient is accompanied by his wife and says he is feeling well and has a normal appetite. He denies difficulty sleeping. Carl Simmons says he stays awake at night and sleeps during the day. He presents today to review recent PET imaging.   Patient continues to smoke 2 packs a day. He has not cut back on his smoking.  He has had a flu shot.   He remains active. Appetite is good. Performance status remains excellent.  No nausea, vomiting. No diarrhea or constipation. Denies headaches.    Review of Systems  Constitutional: Negative for chills and fever.  HENT: Negative.   Eyes: Negative.   Respiratory: Negative.  Negative for cough.   Cardiovascular: Negative.  Negative for chest pain.  Gastrointestinal: Negative.   Genitourinary: Negative.   Musculoskeletal: Negative.   Skin: Negative.        Patient has right ankle lesion 3-4 days ago; doesn't hurt.  Neurological: Negative.  Negative for weakness.  Endo/Heme/Allergies: Negative.   Psychiatric/Behavioral: Negative.   14 point review of systems was performed and is negative except as detailed under history of present illness and above  Past Medical History:  Diagnosis Date  . Adenocarcinoma of right lung ([place]) 07/11/2015  . Bone metastasis ([place]) 08/24/2015  . Cirrhosis of liver ([place]) 08/14/2015   PET on 08/14/2015 demonstrates cirrhotic liver.     Past Surgical History:  Procedure Laterality Date  . right power port placement Right    right subclavian    Family History  Problem Relation Age of Onset  . Cancer Father     thyroid cancer  . Lupus Sister   . Cancer Sister     metastatic ovarian    Social History   Social History  . Marital status: Married    Spouse name: N/A  . Number of children: N/A  . Years of education: N/A   Social History Main Topics  . Smoking status: Current Every Day Smoker    Packs/day: 2.00  . Smokeless  tobacco: Never Used  . Alcohol use No  . Drug use: No  . Sexual activity: Not Asked   Other Topics Concern  . None   Social History Narrative  . None     PHYSICAL EXAMINATION  ECOG PERFORMANCE STATUS: 1 - Symptomatic but completely ambulatory  Vitals:   11/07/15 1037  BP: 126/88  Pulse: (!) 107  Resp: 20  Temp: 97.8 F (36.6 C)     GENERAL:alert, no distress, well nourished, well developed, comfortable, cooperative, obese, smiling and accompanied by his wife.   SKIN: skin color, texture, turgor are normal, no rashes or significant lesions HEAD: Normocephalic  Lesion on scalp 0.5 cm posterior to the right ear EYES: normal, EOMI, Conjunctiva are pink and non-injected EARS: External ears normal OROPHARYNX:lips, buccal mucosa, and tongue normal and mucous membranes are moist  NECK: supple, trachea midline LYMPH:  no palpable lymphadenopathy BREAST:not examined LUNGS: clear to auscultation and percussion HEART: regular rate & rhythm, no murmurs, no  gallops, S1 normal and S2 normal ABDOMEN:abdomen soft, non-tender, obese and normal bowel sounds BACK: Back symmetric, no curvature. EXTREMITIES:less then 2 second capillary refill, no joint deformities, effusion, or inflammation, no edema, no cyanosis  Lesion 0.5cm on right ankle, subcutaneous, mobile NEURO: alert & oriented x 3 with fluent speech, no focal motor/sensory deficits, gait normal   LABORATORY DATA: I have reviewed the data as listed. Results for ANANT, AGARD (MRN [id number]) as of 11/30/2015 18:22  Ref. Range 11/07/2015 10:57  Sodium Latest Ref Range: 135 - 145 mmol/L 135  Potassium Latest Ref Range: 3.5 - 5.1 mmol/L 3.9  Chloride Latest Ref Range: 101 - 111 mmol/L 100 (L)  CO2 Latest Ref Range: 22 - 32 mmol/L 27  BUN Latest Ref Range: 6 - 20 mg/dL 14  Creatinine Latest Ref Range: 0.61 - 1.24 mg/dL 0.76  Calcium Latest Ref Range: 8.9 - 10.3 mg/dL 9.5  EGFR (Non-African Amer.) Latest Ref Range: >60 mL/min  >60  EGFR (African American) Latest Ref Range: >60 mL/min >60  Glucose Latest Ref Range: 65 - 99 mg/dL 129 (H)  Anion gap Latest Ref Range: 5 - 15  8  Alkaline Phosphatase Latest Ref Range: 38 - 126 U/L 54  Albumin Latest Ref Range: 3.5 - 5.0 g/dL 3.8  AST Latest Ref Range: 15 - 41 U/L 24  ALT Latest Ref Range: 17 - 63 U/L 22  Total Protein Latest Ref Range: 6.5 - 8.1 g/dL 7.3  Total Bilirubin Latest Ref Range: 0.3 - 1.2 mg/dL 0.5  WBC Latest Ref Range: 4.0 - 10.5 K/uL 11.1 (H)  RBC Latest Ref Range: 4.22 - 5.81 MIL/uL 5.08  Hemoglobin Latest Ref Range: 13.0 - 17.0 g/dL 16.6  HCT Latest Ref Range: 39.0 - 52.0 % 48.4  MCV Latest Ref Range: 78.0 - 100.0 fL 95.3  MCH Latest Ref Range: 26.0 - 34.0 pg 32.7  MCHC Latest Ref Range: 30.0 - 36.0 g/dL 34.3  RDW Latest Ref Range: 11.5 - 15.5 % 12.5  Platelets Latest Ref Range: 150 - 400 K/uL 309  Neutrophils Latest Units: % 62  Lymphocytes Latest Units: % 29  Monocytes Relative Latest Units: % 7  Eosinophil Latest Units: % 2  Basophil Latest Units: % 0  NEUT# Latest Ref Range: 1.7 - 7.7 K/uL 6.7  Lymphocyte # Latest Ref Range: 0.7 - 4.0 K/uL 3.3  Monocyte # Latest Ref Range: 0.1 - 1.0 K/uL 0.8  Eosinophils Absolute Latest Ref Range: 0.0 - 0.7 K/uL 0.3  Basophils Absolute Latest Ref Range: 0.0 - 0.1 K/uL 0.0  TSH Latest Ref Range: 0.350 - 4.500 uIU/mL 1.297    RADIOGRAPHIC STUDIES: I have personally reviewed the radiological images as listed and agreed with the findings in the report. CLINICAL DATA:  Subsequent treatment strategy for non-small cell lung cancer.  EXAM: NUCLEAR MEDICINE PET SKULL BASE TO THIGH  TECHNIQUE: 12.11 mCi F-18 FDG was injected intravenously. Full-ring PET imaging was performed from the skull base to thigh after the radiotracer. CT data was obtained and used for attenuation correction and anatomic localization.  FASTING BLOOD GLUCOSE:  Value: 111 mg/dl  COMPARISON:   08/04/2015  FINDINGS: NECK  There is an intense focus of increased uptake within the posterior nasopharynx with an SUV max equal to 12.2. Previously 6.7. There is a new right posterior cervical node which exhibits increased uptake. This measures 8 mm and has an SUV max equal to 5.7.  CHEST  Hypermetabolic mediastinal adenopathy is identified and appears progressive when compared  with previous exam. There are 2 new hypermetabolic pre-vascular lymph nodes, image 61 of series 4. Index node measures 8 mm and has an SUV max equal to 15.56. Index right paratracheal lymph node Measures 1.7 cm and has an SUV max equal to 15.03. Previously this measured 1.8 cm and had an SUV max equal to 13.3.  There is no pleural effusion. The index nodule within the right upper lobe measures 1.5 cm and has an SUV max equal to 6.19. Previously this measured 1.2 cm and had an SUV max equal to 5.11. The dominant mass within the right lower lobe measures 6.1 cm and has an SUV max equal to 21, image 46 of series 8. Previously this measured 6.4 cm and had an SUV max equal to 15.58.  ABDOMEN/PELVIS  No abnormal hypermetabolic activity within the liver, pancreas, or spleen. The right adrenal gland metastases measures 1.7 x 2.6 cm and has an SUV max equal to 22.8. Previously this measured 1.7 x 2.6 cm and had an SUV max equal to 7.4. Normal appearance of the left adrenal gland. No hypermetabolic lymph nodes in the abdomen or pelvis.  SKELETON  There is an intense focus of increased radiotracer uptake localizing to the lateral aspect of the right fifth rib within SUV max equal to 16.6. Underline fracture is identified, image 77 of series 4. There is a hypermetabolic lesion involving the left femoral neck within SUV max equal to 16.7. No corresponding CT abnormality identified.  IMPRESSION: 1. Overall there has been interval progression of disease compared with the previous exam. 2. There is an  increased FDG uptake associated with the dominant mass involving the right lower lobe. Additionally, there are new hypermetabolic pre-vascular and right posterior cervical lymph nodes. 3. Significant interval increase in FDG uptake associated with right upper lobe pulmonary nodule and right adrenal gland metastases. 4. New hypermetabolic metastasis is involving the left hip. Additionally, there is intense uptake associated with fractured right rib. Underlying lesion not excluded. 5. Increase in FDG uptake associated with lesion involving the posterior nasopharynx.   Electronically Signed   By: Kerby Moors M.D.   On: 10/31/2015 15:14    PATHOLOGY:    ASSESSMENT AND PLAN:  Adenocarcinoma of right lung, stage IV  Scalp/skin metastases Dizziness Bone metastases Tobacco Abuse  PET/CT was reviewed with the patient and his wife. He has new sites of disease, we discussed that he has had progression. Results are noted above.  Unfortunately, immunotherapy has not been effective . Raedyn and his wife were upset upon hearing this. His PS remains excellent. I explained to them that we still have other options. We will start him on ramucirumab in combination with taxotere. He has met with patient navigator, Anderson Malta, for teaching on new treatment and will begin therapy today. We have checked with Angie, no prior Josem Kaufmann is needed. Orders were entered through [employer] [facility]. I reviewed side effects of new therapy.   I highly encouraged the patient to quit smoking.   End of life discussion/goals of care were addressed today. He remains full code.   Robynn Pane, PA-C will follow-up with Carl Simmons on 11/28/15  Orders Placed This Encounter  Procedures  . CBC with Differential    Standing Status:   Future    Number of Occurrences:   1    Standing Expiration Date:   11/06/2016  . Comprehensive metabolic panel    Standing Status:   Future    Number of Occurrences:   1  Standing  Expiration Date:   11/06/2016    All questions were answered. The patient knows to call the clinic with any problems, questions or concerns. We can certainly see the patient much sooner if necessary.  This document serves as a record of services personally performed by Ancil Linsey, MD. It was created on her behalf by Elmyra Ricks, a trained medical scribe. The creation of this record is based on the scribe's personal observations and the provider's statements to them. This document has been checked and approved by the attending provider.  I have reviewed the above documentation for accuracy and completeness and I agree with the above.  This note is electronically signed by: Molli Hazard, MD   11/30/2015 6:22 PM

## 2015-11-07 NOTE — Progress Notes (Signed)
START ON PATHWAY REGIMEN - Non-Small Cell Lung  [id number]: Docetaxel 75 mg/m2 + Ramucirumab 10 mg/kg q21 Days Until Progression or Unacceptable Toxicity   A cycle is every 21 days:     Ramucirumab (Cyramza (R)) 10 mg/kg in 250 mL NS IV over 60 minutes every 21 days on day 1, PRIOR TO DOCETAXEL. Not stable in D5W - dilute in NS only. Urine protein check recommended at baseline and periodically during therapy Dose Mod: None     Docetaxel (Taxotere(R)) 75 mg/m2 in 250 mL NS IV over 60 minutes every 21 days on day 1 Dose Mod: None Additional Orders: Premedicate with dexamethasone 8 mg PO BID for three days beginning 1 day prior to therapy.  **Always confirm dose/schedule in your pharmacy ordering system**    Patient Characteristics: Stage IV Metastatic, Non Squamous, Third Line - Chemotherapy/Immunotherapy, PS = 0, 1, Prior PD-1/PD-L1 Inhibitor or No Prior PD-1/PD-L1 Inhibitor and Not a Candidate for Immunotherapy AJCC M Stage: X AJCC N Stage: X AJCC T Stage: X Current Disease Status: Distant Metastases AJCC Stage Grouping: IV Histology: Non Squamous Cell ROS1 Rearrangement Status: Negative T790M Mutation Status: Not Applicable - EGFR Mutation Negative/Unknown Other Mutations/Biomarkers: No Other Actionable Mutations PD-L1 Expression Status: PD-L1 Negative Chemotherapy/Immunotherapy LOT: Third Line Chemotherapy/Immunotherapy Molecular Targeted Therapy: Not Appropriate ALK Translocation Status: Negative Would you be surprised if this patient died  in the next year? I would NOT be surprised if this patient died in the next year EGFR Mutation Status: Non-Sensitizing BRAF V600E Mutation Status: Negative Performance Status: PS = 0, 1 Immunotherapy Candidate Status: Not a Candidate for Immunotherapy Prior Immunotherapy Status: Prior PD-1/PD-L1 Inhibitor  Intent of Therapy: Non-Curative / Palliative Intent, Discussed with Patient

## 2015-11-07 NOTE — Progress Notes (Unsigned)
Chemotherapy education pulled together, pre-meds e-scibed, labs entered, doctors/chemo appts made.  Pt education completed.  Consent signed.

## 2015-11-07 NOTE — Patient Instructions (Addendum)
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  You have had progression of disease therefore we are changing your chemotherapy regimen to taxotere and cyramza.  You will receive these drugs every 3 weeks.  One cycle is three weeks.  You treatment is with palliative intent.  You will see the doctor regularly throughout treatment.  We monitor your lab work prior to every treatment.  The doctor monitors your response to treatment by the way you are feeling, your blood work, and scans periodically.  You will receive the following premedication prior to each treatment: Premeds: Benadry and Tylenol:  Given to help prevent a reaction to the chemotherapy.  Dexamethasone - steroid - given to reduce the risk of you having an allergic type reaction to the chemotherapy. Dex can cause you to feel energized, nervous/anxious/jittery, make you have trouble sleeping, and/or make you feel hot/flushed in the face/neck and/or look pink/red in the face/neck. These side effects will pass as the Dex wears off. (takes 20 minutes to infuse)   You will be given a drug called Neulasta 27 hours after chemotherapy through an on body injector. Neulasta - this medication is not chemo but being given because you have had chemo. It is usually given 24-27 hours after the completion of chemotherapy. This medication works by boosting your bone marrow's supply of white blood cells. White blood cells are what protect our bodies against infection. The medication is given in the form of a subcutaneous injection. It is given in the fatty tissue of your abdomen. It is a short needle. The major side effect of this medication is bone or muscle pain. The drug of choice to relieve or lessen the pain is Aleve or Ibuprofen. If a physician has ever told you not to take Aleve or Ibuprofen - then don't take it. You should then take Tylenol/acetaminophen. Take either medication as the bottle  directs you to.  The level of pain you experience as a result of this injection can range from none, to mild or moderate, or severe. Please let us know if you develop moderate or severe bone pain.   **DO NOT expose the Neulasta On-body injector to diagnostic imaging (CT scans, MRI, Ultrasound, X-ray), radiation treatment, or oxygen rich environments, such as hyperbaric chambers. **   Chemotherapy drugs include: Docetaxel (Generic Name) Other Names: Taxotere  About This Drug Docetaxel is used to treat cancer. This drug is given in the vein (IV).  Possible Side Effects (More Common) . Bone marrow depression. This is a decrease in the number of white blood cells, red blood cells, and platelets. This may raise your risk of infection, make you tired and weak (fatigue), and raise your risk of bleeding. . Swelling of your legs, ankles, and/or feet. Steroids are often given to lessen this swelling. . Hair loss: Hair loss is often complete scalp hair loss and can involve loss of eyebrows, eyelashes, and pubic hair. You may notice this a few days or weeks after treatment has started. There have been cases of permanent hair loss reported. . Loose bowel movements (diarrhea) that may last for a few days. . Nausea and throwing up (vomiting). These symptoms may happen within a few hours after your treatment and may last up to 24 hours. Medicines are available to stop or lessen these side effects. . Soreness of the mouth and throat. You may have red areas, white patches, or sores that hurt. . Effects on  the nerves are called peripheral neuropathy. You may feel numbness, tingling, or pain in your hands and feet. It may be hard for you to button your clothes, open jars, or walk as usual. The effect on the nerves may get worse with more doses of the drug. These effects get better in some people after the drug is stopped but it does not get better in all people. . Skin and nail changes. You may develop a  rash or have skin redness and swelling followed by peeling. Nail problems may occur such as changes in nail color, nails becoming thin or brittle, or loss of the nail. Steroids are often given to lessen these side effects. . Weakness that interferes with your daily activities. . This drug contains alcohol and may affect your central nervous system. The central nervous system is made up of your brain and spinal cord. You may feel drunk during and after your treatment and it can impair your ability to drive or use machinery for one to two hours after infusion.  Possible Side Effects (Less Common) . Skin and tissue irritation may involve redness, pain, warmth, or swelling at the IV site. This happens if the drug leaks out of the vein and into nearby tissue. . Changes in your liver function. Your doctor will check your liver function as needed. . Muscle and joint pain.  Effects on the heart: This drug can weaken the heart and lower heart function. Your heart function will be checked as needed. You may have trouble catching your breath, mainly during activities. You may also have trouble breathing while lying down, and have swelling in your ankles. . This drug may cause an increased risk of developing a second cancer. . Skin and tissue irritation may involve redness, pain, warmth, or swelling at the IV site. This happens if the drug leaks out of the vein and into nearby tissue. Marland Kitchen Blurred vision or other changes in eyesight. . A rare risk of death is increased in people with liver problems. Do not take this drug if you have liver disease and talk to your doctor.  Allergic Reactions Serious allergic reactions, including anaphylaxis are rare. While you are getting this drug in your vein (IV), tell your nurse right away if you have any of these symptoms of an allergic reaction: . Trouble catching your breath . Feeling like your tongue or throat are swelling . Feeling your heart beat quickly or in  a not normal way (palpitations) . Feeling dizzy or lightheaded . Flushing, itching, rash, and/or hives  Infusion Reactions While you are getting this drug in your vein (IV), you may have a reaction. Your nurse will check you closely for these signs: fever or shaking chills, flushing, facial swelling, feeling dizzy, headache, trouble breathing, rash, itching, chest tightness, or chest pain. Less serious reactions to this drug may also happen. You will be given medicines to help stop or lessen these symptoms. Your vital signs will be checked during the infusion. Tell your doctor or nurse right away if you have any of these symptoms at any time during the infusion and/or for the first 24 hours after getting this drug: . Fever, chills, or shaking chills . Feeling dizzy or lightheaded . Headache . Nausea or throwing up  Treating Side Effects . Drink 6-8 cups of fluids every day unless your doctor has told you to limit your fluid intake due to some other health problem. A cup is 8 ounces of fluid. If you throw up  or have loose bowel movements, you should drink more fluids so that you do not become dehydrated (lack water in the body due to losing too much fluid). . Talk with your nurse about getting a wig before you lose your hair. Also, call the [facility] at [phone]-[phone] to find out information about the " Look Good,Feel Better" program close to where you live. It is a free program where women undergoing chemotherapy can learn about wigs, turbans and scarves as well as makeup techniques and skin and nail care. . Do not put anything on a rash unless your doctor or nurse says you may. Keep the area around the rash clean and dry. Ask your doctor for medicine if your rash bothers you. . Mouth care is very important. Your mouth care should consist of routine, gentle cleaning of your teeth or dentures and rinsing your mouth with a mixture of 1/2 teaspoon of salt in 8 ounces of  water or  teaspoon of baking soda in 8 ounces of water. This should be done at least after each meal and at bedtime. . If you have mouth sores, avoid mouthwash that has alcohol. Avoid alcohol and smoking because they can bother your mouth and throat. . Ask your doctor or nurse about medicine to stop or lessen loose bowel movements, nausea, throwing up, and joint and muscle pain. . If you have numbness and tingling in your hands and feet, be careful when cooking, walking, and handling sharp objects and hot liquids.  Food and Drug Interactions There are no known interactions of docetaxel with food. This drug may interact with other medicines. Tell your doctor and pharmacist about all the medicines and dietary supplements (vitamins, minerals, herbs and others) that you are taking at this time. The safety and use of dietary supplements and alternative diets are often not known. Using these might affect your cancer or interfere with your treatment. Until more is known, you should not use dietary supplements or alternative diets without your cancer doctor's help.  When to Call the Doctor Call your doctor or nurse right away if you have any of these symptoms: . Fever of 100.5 F (38 C) or above . Chills . Easy bruising or bleeding . Wheezing or trouble breathing . Rash or itching . Feeling dizzy or lightheaded . Loose bowel movements (diarrhea) more than 4 times a day or diarrhea with weakness or feeling lightheaded . Nausea that stops you from eating or drinking . Throwing up more than 3 times a day . Signs of liver problems: dark urine, pale bowel movements, bad stomach pain, feeling very tired and weak, unusual itching, or yellowing of the eyes or skin Eye irritation, blurred vision or other changes in eyesight Call your doctor or nurse as soon as possible if any of these symptoms happen: . Decreased urine . Pain in your mouth or throat that makes it hard to eat or drink . Nausea that  is not relieved by prescribed medicines . Rash that is not relieved by prescribed medicines . Numbness, tingling, decreased feeling or weakness in fingers, toes, arms, or legs . Trouble walking or changes in the way you walk, feeling clumsy when buttoning clothes, opening jars, or other routine hand motions . Swelling of legs, ankles, or feet . Weight gain of 5 pounds in one week (fluid retention) . Fatigue that interferes with your daily activities . Headache that does not go away . Extreme weakness that interferes with normal activities . While you are  getting this drug, please tell your nurse right away if you have any pain, redness, or swelling at the site of the IV infusion . Symptoms of being drunk, confusion, or being very sleepy  Sexual Problems and Reproductive Concerns . Pregnancy warning: This drug may have harmful effects on the unborn child, so effective methods of birth control should be used during your cancer treatment. Genetic counseling is available for you to talk about the effects of this drug therapy on future pregnancies. Also, a genetic counselor can look at the possible risk of problems in the unborn baby due to this medicine if an exposure happens during pregnancy. . Breast feeding warning: It is not known if this drug passes into breast milk. For this reason, women should talk to their doctor about the risks and benefits of breast feeding during treatment with this drug because this drug may enter the breast milk and badly harm a breast feeding baby.    Ramucirumab (Generic Name) Other Names: CyramzaTM . About This Drug Ramucirumab is a drug used to treat patients with certain types of stomach cancer. It is given in the vein (IV).  Possible Side Effects (More Common) . High blood pressure . Loose bowel movements (diarrhea) . Infusion reactions. This can cause you to have shaking chills, back pain, flushing, and low blood pressure . Changes in liver  function tests  Possible Side Effects (Less Common) . Headache . Low sodium level . Rash . Nose bleeds . Protein in the urine  Treating Side Effects . If you get a rash, do not put anything on it unless your doctor or nurse says you may. Keep the area around the rash clean and dry. Ask your doctor for medicine if your rash bothers you. A rash that looks like acne may happen on your face and upper back when taking this drug. Your doctor can give you medicine to help treat this. . Your doctor will check your sodium level, blood pressure, liver function tests and your urine while you are taking this drug. . If you have a nose bleed, sit with your head tipped slightly forward. Apply pressure by lightly pinching the bridge of your nose between your thumb and forefinger. Call your doctor if you feel dizzy or faint or if the bleeding doesn't stop after 10 to 15 minutes. . Ramucirumab may cause slow wound healing. Talk to your doctor before you have any surgeries. . Ask your doctor or nurse about medicine that is available to help stop or lessen the loose bowel movements. Drink 6-8 cups of fluids each day unless your doctor has told you to limit your fluid intake due to some other health problem. A cup is 8 ounces of fluid. If you throw up or have loose bowel movements, you should drink more fluids so that you do not become dehydrated (lack water in the body from losing too much fluid).  Food and Drug Interactions . There are known interactions with ramucirumab and grapefruit. Do not eat grapefruit or drink grapefruit juice while taking this drug. . This drug may also interact with other medicines. Tell your doctor and pharmacist about all the medicines and dietary supplements (vitamins, minerals, herbs and others) that you are taking at this time. The safety and use of dietary supplements and alternative diets are often not known. Using these might affect your cancer or interfere with your  treatment. Until more is known, you should not use dietary supplements or alternative diets without your cancer doctor's  help.  When to Call the Doctor Call your doctor or nurse right away if you have any of these symptoms: . Headache . Nose bleed that does not stop after 10-15 minutes . Extreme fatigue that interferes with your daily activities . Loose bowel movements (diarrhea) 4 times or loose bowel movements with weakness or a feeling lightheaded Call your doctor or nurse as soon as possible if any of these symptoms happen: . Rash  Reproduction Concerns . Pregnancy warning: This drug may have harmful effects on the unborn baby so effective methods of birth control should be used by you and your partner during your cancer treatment and for at least 3 months after the last dose. . Genetic counseling is available for you to talk about the effects of this drug therapy on future pregnancies. Also, a genetic counselor can look at the possible risk of problems in the unborn baby due to this medicine if an exposure happens during pregnancy. . Breast feeding warning: Women should not breast feed during treatment because this drug could enter the breast milk and badly harm a breast feeding baby.    EDUCATIONAL MATERIALS GIVEN AND REVIEWED: Reference sheets given on taxotere and cyramza   SELF CARE ACTIVITIES WHILE ON CHEMOTHERAPY: Hydration Increase your fluid intake 48 hours prior to treatment and drink at least 8 to 12 cups (64 ounces) of water/decaff beverages per day after treatment. You can still have your cup of coffee or soda but these beverages do not count as part of your 8 to 12 cups that you need to drink daily. No alcohol intake.  Medications Continue taking your normal prescription medication as prescribed.  If you start any new herbal or new supplements please let us know first to make sure it is safe.  Mouth Care Have teeth cleaned professionally before starting  treatment. Keep dentures and partial plates clean. Use soft toothbrush and do not use mouthwashes that contain alcohol. Biotene is a good mouthwash that is available at most pharmacies or may be ordered by calling [phone]. Use warm salt water gargles (1 teaspoon salt per 1 quart warm water) before and after meals and at bedtime. Or you may rinse with 2 tablespoons of three-percent hydrogen peroxide mixed in eight ounces of water. If you are still having problems with your mouth or sores in your mouth please call the clinic. If you need dental work, please let Dr. Whitney Muse know before you go for your appointment so that we can coordinate the best possible time for you in regards to your chemo regimen. You need to also let your dentist know that you are actively taking chemo. We may need to do labs prior to your dental appointment.   Skin Care Always use sunscreen that has not expired and with SPF (Sun Protection Factor) of 50 or higher. Wear hats to protect your head from the sun. Remember to use sunscreen on your hands, ears, face, & feet.  Use good moisturizing lotions such as udder cream, eucerin, or even Vaseline. Some chemotherapies can cause dry skin, color changes in your skin and nails.    . Avoid long, hot showers or baths. . Use gentle, fragrance-free soaps and laundry detergent. . Use moisturizers, preferably creams or ointments rather than lotions because the thicker consistency is better at preventing skin dehydration. Apply the cream or ointment within 15 minutes of showering. Reapply moisturizer at night, and moisturize your hands every time after you wash them.  Hair Loss (if your doctor  says your hair will fall out)  . If your doctor says that your hair is likely to fall out, decide before you begin chemo whether you want to wear a wig. You may want to shop before treatment to match your hair color. . Hats, turbans, and scarves can also camouflage hair loss, although some people  prefer to leave their heads uncovered. If you go bare-headed outdoors, be sure to use sunscreen on your scalp. . Cut your hair short. It eases the inconvenience of shedding lots of hair, but it also can reduce the emotional impact of watching your hair fall out. . Don't perm or color your hair during chemotherapy. Those chemical treatments are already damaging to hair and can enhance hair loss. Once your chemo treatments are done and your hair has grown back, it's OK to resume dyeing or perming hair. With chemotherapy, hair loss is almost always temporary. But when it grows back, it may be a different color or texture. In older adults who still had hair color before chemotherapy, the new growth may be completely gray.  Often, new hair is very fine and soft.  Infection Prevention Please wash your hands for at least 30 seconds using warm soapy water. Handwashing is the #1 way to prevent the spread of germs. Stay away from sick people or people who are getting over a cold. If you develop respiratory systems such as green/yellow mucus production or productive cough or persistent cough let us know and we will see if you need an antibiotic. It is a good idea to keep a pair of gloves on when going into grocery stores/[employer] to decrease your risk of coming into contact with germs on the carts, etc. Carry alcohol hand gel with you at all times and use it frequently if out in public. If your temperature reaches 100.5 or higher please call the clinic and let us know.  If it is after hours or on the weekend please go to the ER if your temperature is over 100.5.  Please have your own personal thermometer at home to use.    Sex and bodily fluids If you are going to have sex, a condom must be used to protect the person that isn't taking chemotherapy. Chemo can decrease your libido (sex drive). For a few days after chemotherapy, chemotherapy can be excreted through your bodily fluids.  When using the toilet please close  the lid and flush the toilet twice.  Do this for a few day after you have had chemotherapy.     Effects of chemotherapy on your sex life Some changes are simple and won't last long. They won't affect your sex life permanently. Sometimes you may feel: . too tired . not strong enough to be very active . sick or sore  . not in the mood . anxious or low Your anxiety might not seem related to sex. For example, you may be worried about the cancer and how your treatment is going. Or you may be worried about money, or about how you family are coping with your illness. These things can cause stress, which can affect your interest in sex. It's important to talk to your partner about how you feel. Remember - the changes to your sex life don't usually last long. There's usually no medical reason to stop having sex during chemo. The drugs won't have any long term physical effects on your performance or enjoyment of sex. Cancer can't be passed on to your partner during sex  Contraception It's important to use reliable contraception during treatment. Avoid getting pregnant while you or your partner are having chemotherapy. This is because the drugs may harm the baby. Sometimes chemotherapy drugs can leave a man or woman infertile.  This means you would not be able to have children in the future. You might want to talk to someone about permanent infertility. It can be very difficult to learn that you may no longer be able to have children. Some people find counselling helpful. There might be ways to preserve your fertility, although this is easier for men than for women. You may want to speak to a fertility expert. You can talk about sperm banking or harvesting your eggs. You can also ask about other fertility options, such as donor eggs. If you have or have had breast cancer, your doctor might advise you not to take the contraceptive pill. This is because the hormones in it might affect the cancer.  It is not  known for sure whether or not chemotherapy drugs can be passed on through semen or secretions from the vagina. Because of this some doctors advise people to use a barrier method (such as condoms, femidoms or dental dams) if you have sex during treatment. This applies to vaginal, anal or oral sex. Generally, doctors advise a barrier method only for the time you are actually having the treatment and for about a week after your treatment. Advice like this can be worrying, but this does not mean that you have to avoid being intimate with your partner. You can still have close contact with your partner and continue to enjoy sex.  Animals If you have cats or birds we just ask that you not change the litter or change the cage.  Please have someone else do this for you while you are on chemotherapy.   Food Safety During and After Cancer Treatment Food safety is important for people both during and after cancer treatment. Cancer and cancer treatments, such as chemotherapy, radiation therapy, and stem cell/bone marrow transplantation, often weaken the immune system. This makes it harder for your body to protect itself from foodborne illness, also called food poisoning. Foodborne illness is caused by eating food that contains harmful bacteria, parasites, or viruses.  Foods to avoid Some foods have a higher risk of becoming tainted with bacteria. These include: Marland Kitchen Unwashed fresh fruit and vegetables, especially leafy vegetables that can hide dirt and other contaminants . Raw sprouts, such as alfalfa sprouts . Raw or undercooked beef, especially ground beef, or other raw or undercooked meat and poultry . Cold hot dogs or deli lunch meat (cold cuts), including dry-cured, uncooked salami. Always cook or reheat these foods until they are steaming hot. . Fatty, fried, or spicy foods immediately before or after treatment.  These can sit heavy on your stomach and make you feel nauseous. . Raw or undercooked shellfish,  such as oysters. . Sushi and sashimi, which often contain raw fish.  . Unpasteurized beverages, such as unpasteurized fruit juices, raw milk, raw yogurt, or cider . Soft cheeses made from unpasteurized milk, such as blue-veined (a type of blue cheese), Brie, Camembert, feta, goat cheese, and queso fresco or blanco . Undercooked eggs, such as soft boiled, over easy, and poached; raw, unpasteurized eggs; or foods made with raw egg, such as homemade raw cookie dough and homemade mayonnaise . Deli-prepared salads with egg, ham, chicken, or seafood Simple steps for food safety Shop smart. . Do not buy food stored or  displayed in an unclean area. . Do not buy bruised or damaged fruits or vegetables. . Do not buy cans that have cracks, dents, or bulges. . Pick up foods that can spoil at the end of your shopping trip and store them in a cooler on the way home. Prepare and clean up foods carefully. . Rinse all fresh fruits and vegetables under running water, and dry them with a clean towel or paper towel. . Clean the top of cans before opening them. . After preparing food, wash your hands for 20 seconds with hot water and soap. Pay special attention to areas between fingers and under nails. . Clean your utensils and dishes with hot water and soap. Marland Kitchen Disinfect your kitchen and cutting boards using 1 teaspoon of liquid, unscented bleach mixed into 1 quart of water.   Prevent cross-contamination. Marland Kitchen Keep raw meat, poultry, and fish or their juices away from other food. Bacteria can spread through contact with the food or its liquid, causing cross-contamination. . Do not rinse raw meat or poultry because it can spread bacteria to nearby surfaces. Wendee Copp all items you used for preparing raw foods, including utensils, cutting board, and plates, before using them for other foods or cooked meat. . Set aside a specific cutting board for preparing uncooked meat, fish, and chicken. Never use it for uncooked fruits,  vegetables, or other foods. Dispose of old food. . Eat canned and packaged food before its expiration date (the "use by" or "best before" date). . Consume refrigerated leftovers within 3 to 4 days. After that time, throw out the food. Even if the food does not smell or look spoiled, it still may be unsafe. Some bacteria, such as Listeria, can grow even on foods stored in the refrigerator if they are kept for too long. Take precautions when eating out. . At restaurants, avoid buffets and salad bars where food sits out for a long time and comes in contact with many people. Food can become contaminated when someone with a virus, often a norovirus, or another "bug" handles it. . Put any leftover food in a "to-go" container yourself, rather than having the server do it. And, refrigerate leftovers as soon as you get home. . Choose restaurants that are clean and that are willing to prepare your food as you order it cooked. Cook food to the right temperature. Use a food thermometer to check for a safe internal temperature of all poultry and meat. For instance, a hamburger should be cooked to at least medium (160?F or 71?C). Get a full list of recommended internal cooking temperatures on the website of the U.S. Food and Drug Administration (FDA).  Chill food promptly. Refrigerate or freeze perishable food within 2 hours of cooking or buying it (sooner in warm weather.) Proper cooking destroys bacteria, but they can still grow on cooked food that is left out too long. Food stored in the refrigerator should be kept at below 40?F (4?C). And, food stored in the freezer should be kept below 32?F (0?C). Thaw food properly. Thaw frozen food in the refrigerator rather than at room temperature. You can also thaw food in frequently changed cold water or in the microwave, but cook it as soon as it thaws.     MEDICATIONS: Dexamethasone '4mg'$  tablet. Take 2 tablets (8 mg total) by mouth 2 (two) times daily. Start the day  before Taxotere. Then daily after chemo for 2 days.  Zofran/Ondansetron '8mg'$  tablet. Take 1 tablet every 8 hours as needed for nausea/vomiting. (#1 nausea med to take, this can constipate)  Compazine/Prochlorperazine '10mg'$  tablet. Take 1 tablet every 6 hours as needed for nausea/vomiting. (#2 nausea med to take, this can make you sleepy)   EMLA cream. Apply a quarter size amount to port site 1 hour prior to chemo. Do not rub in. Cover with plastic wrap.   Over-the-Counter Meds: Miralax 1 capful in 8 oz of fluid daily. May increase to two times a day if needed. This is a stool softener. If this doesn't work proceed you can add:  Senokot S-start with 1 tablet two times a day and increase to 4 tablets two times a day if needed. (total of 8 tablets in a 24 hour period). This is a stimulant laxative.   Call us if this does not help your bowels move.   Imodium '2mg'$  capsule. Take 2 capsules after the 1st loose stool and then 1 capsule every 2 hours until you go a total of 12 hours without having a loose stool. Call the [facility] if loose stools continue. If diarrhea occurs @ bedtime, take 2 capsules @ bedtime. Then take 2 capsules every 4 hours until morning. Call [facility].     Diarrhea Sheet  If you are having loose stools/diarrhea, please purchase Imodium and begin taking as outlined:  At the first sign of poorly formed or loose stools you should begin taking Imodium(loperamide) 2 mg capsules.  Take two caplets ('4mg'$ ) followed by one caplet ('2mg'$ ) every 2 hours until you have had no diarrhea for 12 hours.  During the night take two caplets ('4mg'$ ) at bedtime and continue every 4 hours during the night until the morning.  Stop taking Imodium only after there is no sign of diarrhea for 12 hours.    Always call the [facility] if you are  having loose stools/diarrhea that you can't get under control.  Loose stools/disrrhea leads to dehydration (loss of water) in your body.  We have other options of trying to get the loose stools/diarrhea to stopped but you must let us know!     Constipation Sheet *Miralax in 8 oz of fluid daily.  May increase to two times a day if needed.  This is a stool softener.  If this not enough to keep your bowel regular:  You can add:  *Senokot S, start with one tablet twice a day and can increase to 4 tablets twice a day if needed.  This is a stimulant laxative.   Sometimes when you take pain medication you need BOTH a medicine to keep your stool soft and a medicine to help your bowel push it out!  Please call if the above does not work for you.      Nausea Sheet  Zofran/Ondansetron '8mg'$  tablet. Take 1 tablet every 8 hours as needed for nausea/vomiting. (#1 nausea med to take, this can constipate)  Compazine/Prochlorperazine '10mg'$  tablet. Take 1 tablet every 6 hours as needed for nausea/vomiting. (#2 nausea med to take, this can make you sleepy)  You can take these medications together or separately.  We would first like for you to try the Ondansetron by itself and then take the Prochloperizine if needed. But you are allowed to take both medications at the same time if your nausea is that severe.  If you are having persistent nausea (nausea that does not stop) please take these medications on a staggered schedule so that the nausea medication  stays in your body.  Please call the [facility] and let us know the amount of nausea that you are experiencing.  If you begin to vomit, you need to call the [facility] and if it is the weekend and you have vomited more than one time and cant get it to stop-go to the Emergency Room.  Persistent nausea/vomiting can lead to dehydration (loss of fluid in your body) and will make you feel terrible.   Ice chips, sips of clear liquids, foods that are @ room  temperature, crackers, and toast tend to be better tolerated.      SYMPTOMS TO REPORT AS SOON AS POSSIBLE AFTER TREATMENT:  FEVER GREATER THAN 100.5 F  CHILLS WITH OR WITHOUT FEVER  NAUSEA AND VOMITING THAT IS NOT CONTROLLED WITH YOUR NAUSEA MEDICATION  UNUSUAL SHORTNESS OF BREATH  UNUSUAL BRUISING OR BLEEDING  TENDERNESS IN MOUTH AND THROAT WITH OR WITHOUT PRESENCE OF ULCERS  URINARY PROBLEMS  BOWEL PROBLEMS  UNUSUAL RASH    Wear comfortable clothing and clothing appropriate for easy access to any Portacath or PICC line. Let us know if there is anything that we can do to make your therapy better!     What to do if you need assistance after hours or on the weekends: CALL [phone].  HOLD on the line, do not hang up.  You will hear multiple messages but at the end you will be connected with a nurse triage line.  They will contact Dr Whitney Muse if necessary.  Most of the time they will be able to assist you.    Do not call the hospital operator.  Dr Whitney Muse will not answer phone calls received by them.    I have been informed and understand all of the instructions given to me and have received a copy. I have been instructed to call the clinic [phone]  or my family physician as soon as possible for continued medical care, if indicated. I do not have any more questions at this time but understand that I may call the [facility] at [phone] during office hours should I have questions or need assistance in obtaining follow-up care.

## 2015-11-07 NOTE — Patient Instructions (Signed)
[facility] Discharge Instructions for Patients Receiving Chemotherapy   Beginning January 23rd 2017 lab work for the [facility] will be done in the  Main lab at [facility] on 1st floor. If you have a lab appointment with the [facility] please come in thru the  Main Entrance and check in at the main information desk   Today you received the following chemotherapy agents Taxotere,Cyramza and Neulasta on-pro as well as Xgeva injection. Follow-up as scheduled. Call clinic for any questions or concerns  To help prevent nausea and vomiting after your treatment, we encourage you to take your nausea medication   If you develop nausea and vomiting, or diarrhea that is not controlled by your medication, call the clinic.  The clinic phone number is [phone]. Office hours are Monday-Friday 8:30am-5:00pm.  BELOW ARE SYMPTOMS THAT SHOULD BE REPORTED IMMEDIATELY:  *FEVER GREATER THAN 101.0 F  *CHILLS WITH OR WITHOUT FEVER  NAUSEA AND VOMITING THAT IS NOT CONTROLLED WITH YOUR NAUSEA MEDICATION  *UNUSUAL SHORTNESS OF BREATH  *UNUSUAL BRUISING OR BLEEDING  TENDERNESS IN MOUTH AND THROAT WITH OR WITHOUT PRESENCE OF ULCERS  *URINARY PROBLEMS  *BOWEL PROBLEMS  UNUSUAL RASH Items with * indicate a potential emergency and should be followed up as soon as possible. If you have an emergency after office hours please contact your primary care physician or go to the nearest emergency department.  Please call the clinic during office hours if you have any questions or concerns.   You may also contact the Patient Navigator at [phone] should you have any questions or need assistance in obtaining follow up care.      Resources For Cancer Patients and their Caregivers ? American Cancer Society: Can assist with transportation, wigs, general needs, runs Look Good Feel Better.        ([phone] ? Cancer Care: Provides financial assistance, online support groups,  medication/co-pay assistance.  1-800-813-HOPE 930-743-2469) ? [facility] Assists [place] cancer patients and their families through emotional , educational and financial support.  5021567684 ? [place] DSS Where to apply for food stamps, Medicaid and utility assistance. ([phone] ? RCATS: Transportation to medical appointments. [phone] ? Social Security Administration: May apply for disability if have a Stage IV cancer. [phone] 443-793-4431 ? [employer], Disability and Transit Services: Assists with nutrition, care and transit needs. [phone]

## 2015-11-07 NOTE — Progress Notes (Signed)
Pt to be started on new chemo medications today including Taxotere,Cyramza and Neulasta on-pro per Dr. Whitney Muse. Labs reviewed and Shellia Carwin RN instructed pt on these new medications.New permit signed                                                                                   Carl Simmons tolerated new chemo medications well without complaints or incident. Pt instructed in application process for Neulasta on-pro which was applied to left arm. Instructed pt and wife that the Neulasta should not be removed until 8pm tomorrow evening and to call if he experiences any problems with it leaking or falling off. Pt verbalized understanding. VSS upon discharge. Pt discharged self ambulatory in satisfactory condition with wife

## 2015-11-07 NOTE — Patient Instructions (Addendum)
[place] at [facility] Discharge Instructions  RECOMMENDATIONS MADE BY THE CONSULTANT AND ANY TEST RESULTS WILL BE SENT TO YOUR REFERRING PHYSICIAN.  You saw Dr. Whitney Muse today. Follow up in 3 weeks with labs and chemo also.  Thank you for choosing [place] at [facility] to provide your oncology and hematology care.  To afford each patient quality time with our provider, please arrive at least 15 minutes before your scheduled appointment time.   Beginning January 23rd 2017 lab work for the [facility] will be done in the  Main lab at [facility] on 1st floor. If you have a lab appointment with the [facility] please come in thru the  Main Entrance and check in at the main information desk  You need to re-schedule your appointment should you arrive 10 or more minutes late.  We strive to give you quality time with our providers, and arriving late affects you and other patients whose appointments are after yours.  Also, if you no show three or more times for appointments you may be dismissed from the clinic at the providers discretion.     Again, thank you for choosing [facility].  Our hope is that these requests will decrease the amount of time that you wait before being seen by our physicians.       _____________________________________________________________  Should you have questions after your visit to [facility], please contact our office at [phone] between the hours of 8:30 a.m. and 4:30 p.m.  Voicemails left after 4:30 p.m. will not be returned until the following business day.  For prescription refill requests, have your pharmacy contact our office.         Resources For Cancer Patients and their Caregivers ? American Cancer Society: Can assist with transportation, wigs, general needs, runs Look Good Feel Better.        8575541403 ? Cancer Care: Provides financial assistance, online  support groups, medication/co-pay assistance.  1-800-813-HOPE (432)793-1116) ? [facility] Assists [place] cancer patients and their families through emotional , educational and financial support.  ([phone] ? [place] DSS Where to apply for food stamps, Medicaid and utility assistance. ([phone] ? RCATS: Transportation to medical appointments. 443-221-4309 ? Social Security Administration: May apply for disability if have a Stage IV cancer. 415-882-1328 838-370-9035 ? [employer], Disability and Transit Services: Assists with nutrition, care and transit needs. [place] Support Programs: '@10RELATIVEDAYS'$ @ > Cancer Support Group  2nd Tuesday of the month 1pm-2pm, Journey Room  > Creative Journey  3rd Tuesday of the month 1130am-1pm, Journey Room  > Look Good Feel Better  1st Wednesday of the month 10am-12 noon, Journey Room (Call [place] to register [phone])

## 2015-11-08 ENCOUNTER — Telehealth (HOSPITAL_COMMUNITY): Payer: Self-pay | Admitting: *Deleted

## 2015-11-08 NOTE — Telephone Encounter (Signed)
Spoke with patient and his wife. Both report he feels fine today. Denies any problems/complications from first dose taxotere and cyramza. Reports neulasta OnPro "light is still blinking". No other problems/concerns voiced.

## 2015-11-12 ENCOUNTER — Other Ambulatory Visit (HOSPITAL_COMMUNITY): Payer: Self-pay | Admitting: Hematology & Oncology

## 2015-11-19 ENCOUNTER — Telehealth (HOSPITAL_COMMUNITY): Payer: Self-pay | Admitting: Emergency Medicine

## 2015-11-19 NOTE — Telephone Encounter (Signed)
Wife called and stated that where Dr Sherrie Sport had completed a biopsy in the pts head the scab had fallen off and there was a hole there.  She said that she put Vaseline on it and covered it with a band aid.  I told her she would have to take him back to see Dr Sherrie Sport to look at that since he was the one who did the biopsy but I would let Dr Gershon Mussel know what was going on.  She verbalized understanding.  I spoke with Kirby Crigler PA and he was in agreement.

## 2015-11-25 ENCOUNTER — Other Ambulatory Visit: Payer: BLUE CROSS/BLUE SHIELD

## 2015-11-28 ENCOUNTER — Encounter (HOSPITAL_COMMUNITY): Payer: Self-pay | Admitting: Oncology

## 2015-11-28 ENCOUNTER — Encounter (HOSPITAL_COMMUNITY): Payer: BLUE CROSS/BLUE SHIELD | Attending: Hematology & Oncology

## 2015-11-28 ENCOUNTER — Encounter (HOSPITAL_BASED_OUTPATIENT_CLINIC_OR_DEPARTMENT_OTHER): Payer: BLUE CROSS/BLUE SHIELD | Admitting: Oncology

## 2015-11-28 VITALS — BP 100/78 | HR 89 | Temp 98.0°F | Resp 20 | Wt 242.0 lb

## 2015-11-28 DIAGNOSIS — D72825 Bandemia: Secondary | ICD-10-CM

## 2015-11-28 DIAGNOSIS — C7951 Secondary malignant neoplasm of bone: Secondary | ICD-10-CM

## 2015-11-28 DIAGNOSIS — C7931 Secondary malignant neoplasm of brain: Secondary | ICD-10-CM

## 2015-11-28 DIAGNOSIS — Z5112 Encounter for antineoplastic immunotherapy: Secondary | ICD-10-CM | POA: Diagnosis not present

## 2015-11-28 DIAGNOSIS — C792 Secondary malignant neoplasm of skin: Secondary | ICD-10-CM

## 2015-11-28 DIAGNOSIS — C3491 Malignant neoplasm of unspecified part of right bronchus or lung: Secondary | ICD-10-CM

## 2015-11-28 LAB — URINALYSIS, ROUTINE W REFLEX MICROSCOPIC
Glucose, UA: 100 mg/dL — AB
Hgb urine dipstick: NEGATIVE
Leukocytes, UA: NEGATIVE
NITRITE: NEGATIVE
Protein, ur: 100 mg/dL — AB
pH: 6 (ref 5.0–8.0)

## 2015-11-28 LAB — CBC WITH DIFFERENTIAL/PLATELET
BASOS ABS: 0 10*3/uL (ref 0.0–0.1)
BASOS PCT: 0 %
Eosinophils Absolute: 0 10*3/uL (ref 0.0–0.7)
Eosinophils Relative: 0 %
HCT: 47.1 % (ref 39.0–52.0)
Hemoglobin: 16 g/dL (ref 13.0–17.0)
Lymphocytes Relative: 9 %
Lymphs Abs: 1.8 10*3/uL (ref 0.7–4.0)
MCH: 32.3 pg (ref 26.0–34.0)
MCHC: 34 g/dL (ref 30.0–36.0)
MCV: 95.2 fL (ref 78.0–100.0)
MONO ABS: 0.9 10*3/uL (ref 0.1–1.0)
MONOS PCT: 5 %
Neutro Abs: 16.8 10*3/uL — ABNORMAL HIGH (ref 1.7–7.7)
Neutrophils Relative %: 86 %
PLATELETS: 443 10*3/uL — AB (ref 150–400)
RBC: 4.95 MIL/uL (ref 4.22–5.81)
RDW: 13.6 % (ref 11.5–15.5)
WBC: 19.6 10*3/uL — ABNORMAL HIGH (ref 4.0–10.5)

## 2015-11-28 LAB — COMPREHENSIVE METABOLIC PANEL
ALBUMIN: 3.7 g/dL (ref 3.5–5.0)
ALT: 23 U/L (ref 17–63)
ANION GAP: 7 (ref 5–15)
AST: 20 U/L (ref 15–41)
Alkaline Phosphatase: 48 U/L (ref 38–126)
BUN: 15 mg/dL (ref 6–20)
CHLORIDE: 102 mmol/L (ref 101–111)
CO2: 25 mmol/L (ref 22–32)
Calcium: 9.3 mg/dL (ref 8.9–10.3)
Creatinine, Ser: 0.8 mg/dL (ref 0.61–1.24)
GFR calc Af Amer: >60 mL/min (ref >60)
GFR calc non Af Amer: >60 mL/min (ref >60)
GLUCOSE: 185 mg/dL — AB (ref 65–99)
POTASSIUM: 4.4 mmol/L (ref 3.5–5.1)
SODIUM: 134 mmol/L — AB (ref 135–145)
TOTAL PROTEIN: 7.2 g/dL (ref 6.5–8.1)
Total Bilirubin: 0.5 mg/dL (ref 0.3–1.2)

## 2015-11-28 LAB — URINE MICROSCOPIC-ADD ON
SQUAMOUS EPITHELIAL / LPF: NONE SEEN
WBC UA: NONE SEEN WBC/hpf (ref 0–5)

## 2015-11-28 MED ORDER — ACETAMINOPHEN 325 MG PO TABS
ORAL_TABLET | ORAL | Status: AC
  Filled 2015-11-28: qty 2

## 2015-11-28 MED ORDER — SODIUM CHLORIDE 0.9% FLUSH: 10.0000 mL | INTRAVENOUS | Status: DC | PRN

## 2015-11-28 MED ORDER — DEXAMETHASONE SODIUM PHOSPHATE 10 MG/ML IJ SOLN
10.0000 mg | Freq: Once | INTRAMUSCULAR | Status: AC
  Administered 2015-11-28: 10 mg via INTRAVENOUS

## 2015-11-28 MED ORDER — DIPHENHYDRAMINE HCL 50 MG/ML IJ SOLN
INTRAMUSCULAR | Status: AC
  Filled 2015-11-28: qty 1

## 2015-11-28 MED ORDER — SODIUM CHLORIDE 0.9 % IV SOLN
Freq: Once | INTRAVENOUS | Status: AC
  Administered 2015-11-28: 11:00:00 via INTRAVENOUS

## 2015-11-28 MED ORDER — SODIUM CHLORIDE 0.9 % IV SOLN
10.0000 mg/kg | Freq: Once | INTRAVENOUS | Status: AC
  Administered 2015-11-28: 1100 mg via INTRAVENOUS
  Filled 2015-11-28: qty 100

## 2015-11-28 MED ORDER — DEXAMETHASONE SODIUM PHOSPHATE 10 MG/ML IJ SOLN
INTRAMUSCULAR | Status: AC
  Filled 2015-11-28: qty 1

## 2015-11-28 MED ORDER — PEGFILGRASTIM 6 MG/0.6ML ~~LOC~~ PSKT
PREFILLED_SYRINGE | SUBCUTANEOUS | Status: AC
  Filled 2015-11-28: qty 0.6

## 2015-11-28 MED ORDER — HEPARIN SOD (PORK) LOCK FLUSH 100 UNIT/ML IV SOLN
500.0000 [IU] | Freq: Once | INTRAVENOUS | Status: AC | PRN
  Administered 2015-11-28: 500 [IU]
  Filled 2015-11-28 (×2): qty 5

## 2015-11-28 MED ORDER — DOCETAXEL CHEMO INJECTION 160 MG/16ML
75.0000 mg/m2 | Freq: Once | INTRAVENOUS | Status: AC
  Administered 2015-11-28: 170 mg via INTRAVENOUS
  Filled 2015-11-28: qty 17

## 2015-11-28 MED ORDER — ACETAMINOPHEN 325 MG PO TABS
650.0000 mg | ORAL_TABLET | Freq: Once | ORAL | Status: AC
  Administered 2015-11-28: 650 mg via ORAL

## 2015-11-28 MED ORDER — PEGFILGRASTIM 6 MG/0.6ML ~~LOC~~ PSKT
6.0000 mg | PREFILLED_SYRINGE | Freq: Once | SUBCUTANEOUS | Status: AC
  Administered 2015-11-28: 6 mg via SUBCUTANEOUS

## 2015-11-28 MED ORDER — DIPHENHYDRAMINE HCL 50 MG/ML IJ SOLN
50.0000 mg | Freq: Once | INTRAMUSCULAR | Status: AC
  Administered 2015-11-28: 50 mg via INTRAVENOUS

## 2015-11-28 NOTE — Assessment & Plan Note (Addendum)
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  Treated by Dr. Tressie Stalker at [facility] with Carboplatin/Pemetrexed every 21 days x 6 cycles (12/10/2014- 03/25/2015) with transition to maintenance Pemetrexed every 21 days with last dose being administered on 08/01/2015. Imaging demonstrated progression of disease, resulting in a change in therapy to [place] beginning on 08/29/2015- 10/24/2015.  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 Lisbeth Renshaw. PET scan on 10/31/2015 demonstrated progression of disease resulting in a change in therapy to Docetaxel and Ramucirumab beginning on 11/07/2015.  Oncology history is updated.  Pre-treatment labs today: CBC diff, CMET.  I personally reviewed and went over laboratory results with the patient.  The results are noted within this dictation.  Leukocytosis with neutrophilia is noted.  He did get Neulasta with his last treatment.  His leukocytosis unexpectedly elevated.  He denies any infectious complaints.  He denies any urinary complaints.  Order is placed for UA with reflex to confirm no infection given leukocytosis.  UA is negative for findings suspicious for infection.  We will need to continue to follow TSH given his previous Opdivo treatment.  Return in 3 weeks for follow-up and next cycle of chemotherapy.

## 2015-11-28 NOTE — Patient Instructions (Signed)
[place] at [facility] Discharge Instructions  RECOMMENDATIONS MADE BY THE CONSULTANT AND ANY TEST RESULTS WILL BE SENT TO YOUR REFERRING PHYSICIAN.  You were seen today by Kirby Crigler PA-C.  Use pain medication for chemotherapy induced pain. Return in 3 weeks for follow up and treatment.   Thank you for choosing [place] at [facility] to provide your oncology and hematology care.  To afford each patient quality time with our provider, please arrive at least 15 minutes before your scheduled appointment time.   Beginning January 23rd 2017 lab work for the [facility] will be done in the  Main lab at [facility] on 1st floor. If you have a lab appointment with the [facility] please come in thru the  Main Entrance and check in at the main information desk  You need to re-schedule your appointment should you arrive 10 or more minutes late.  We strive to give you quality time with our providers, and arriving late affects you and other patients whose appointments are after yours.  Also, if you no show three or more times for appointments you may be dismissed from the clinic at the providers discretion.     Again, thank you for choosing [facility].  Our hope is that these requests will decrease the amount of time that you wait before being seen by our physicians.       _____________________________________________________________  Should you have questions after your visit to [facility], please contact our office at [phone] between the hours of 8:30 a.m. and 4:30 p.m.  Voicemails left after 4:30 p.m. will not be returned until the following business day.  For prescription refill requests, have your pharmacy contact our office.         Resources For Cancer Patients and their Caregivers ? American Cancer Society: Can assist with transportation, wigs, general needs, runs Look Good Feel Better.         [phone] ? Cancer Care: Provides financial assistance, online support groups, medication/co-pay assistance.  1-800-813-HOPE 9086467439) ? [facility] Assists [place] cancer patients and their families through emotional , educational and financial support.  905-371-3446 ? [place] DSS Where to apply for food stamps, Medicaid and utility assistance. 205-641-2130 ? RCATS: Transportation to medical appointments. 418-603-7917 ? Social Security Administration: May apply for disability if have a Stage IV cancer. 819-634-9566 425-439-8690 ? [employer], Disability and Transit Services: Assists with nutrition, care and transit needs. [place] Support Programs: '@10RELATIVEDAYS'$ @ > Cancer Support Group  2nd Tuesday of the month 1pm-2pm, Journey Room  > Creative Journey  3rd Tuesday of the month 1130am-1pm, Journey Room  > Look Good Feel Better  1st Wednesday of the month 10am-12 noon, Journey Room (Call [place] to register [phone])

## 2015-11-28 NOTE — Progress Notes (Signed)
1410:  Tolerated tx w/o adverse reaction.  Alert, in no distress.  VSS.  Discharged ambulatory.

## 2015-11-28 NOTE — Progress Notes (Signed)
[name], PA-C [place] [address]  Adenocarcinoma of right lung ([place])  [place] - Plan: Urinalysis, Routine w reflex microscopic  CURRENT THERAPY: Docetaxel + Ramucirumab beginning on 11/07/2015  INTERVAL HISTORY: Carl Simmons 55 y.o. 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 Treated by Dr. Tressie Stalker at [facility] with Carboplatin/Pemetrexed every 21 days x 6 cycles (12/10/2014- 03/25/2015) with transition to maintenance Pemetrexed every 21 days with last dose being administered on 08/01/2015. Imaging demonstrated progression of disease, resulting in a change in therapy to [place] beginning on 08/29/2015- 10/24/2015.  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 Lisbeth Renshaw. PET scan on 10/31/2015 demonstrated progression of disease resulting in a change in therapy to Docetaxel and Ramucirumab beginning on 11/07/2015.    Adenocarcinoma of right lung ([place])   11/20/2014 Procedure    Right scalp skin biopsy      11/20/2014 Pathology Results    Adenocarcinoma of lung primary. PDL-1 NEGATIVE (0% tumor proportion score); EGFR mutation NEGATIVE; no evidence of ALK gene rearrangement detected by FISH; no reaarnagement of ROS1 gene detected.      11/29/2014 PET scan    Large hypermetabolic mass within the RLL with central necrosis. Hypermetabolic right suprahilar mass with postobstructive collapse of the RUL. Subcarinal, paratracheal, prevascular and R internal mammary nodal metastases. Small R adrenal gland nodule.      12/10/2014 - 03/25/2015 Chemotherapy    Carboplatin and pemetrexed 6 cycles      02/11/2015 PET scan    Partial metabolic response to therapy, with decrease in hypermetabolic right lung masses, postobstructive right upper lobe consolidation, thoracic lymphadenopathy and right adrenal metastasis. No new or progressive disease identified.      04/05/2015 PET scan    No significant change in hypermetabolic right lung masses and thoracic lymphadenopathy. No new or progressive disease identified.      04/15/2015 - 08/01/2015 Chemotherapy    Pemetrexed maintenance      08/14/2015 PET scan    Overall progression of metastatic lung cancer. Increasingly hypermetabolic mediastinal lymph nodes and R lung masses, enlarging R adrenal mass and new osseous lesions. Enlarging hypermetabolic R-sided scalp nodules.      08/14/2015 Progression    Progression of disease on PET imaging      08/29/2015 - 10/24/2015 Chemotherapy    Opdivo      08/29/2015 Miscellaneous    Xgeva started monthly      09/05/2015 Imaging    MRI brain- Multiple metastatic deposits in the brain. At least 9 lesions are identified. Mild edema around the 10 mm lesion in the right cerebellum.      09/27/2015 - 09/27/2015 Radiation Therapy    Dr. Lisbeth Renshaw- SRS to brain mets:  PTV target 1-10 were treated using 2 treatment plans. A single isocenter technique was utilized to treat 8 lesion simultaneously. PTV's 2 and 5 were then treated simultaneously with a second radiation treatment plan due to their close proximity. A prescription dose of 20 Gy was delivered to each lesion. ExacTrac Snap verification was performed for each couch angle.       10/31/2015 PET scan    1. Overall there has been interval progression of disease compared with the previous exam. 2. There is an increased FDG uptake associated with the dominant mass involving the right lower lobe. Additionally, there are new hypermetabolic pre-vascular and right posterior cervical  lymph nodes. 3. Significant interval increase in FDG uptake associated with right upper lobe pulmonary nodule and right adrenal gland metastases. 4. New hypermetabolic metastasis is involving the left hip. Additionally, there is intense uptake associated with fractured right rib. Underlying lesion not excluded. 5. Increase in FDG uptake  associated with lesion involving the posterior nasopharynx.      10/31/2015 Progression    Progression of disease      11/07/2015 Treatment Plan Change    D/C Opdivo and change to Docetaxel + Ramucirumab      11/07/2015 -  Chemotherapy    Docetaxel + Ramucirumab       He tolerated his first cycle well, but noted the side effect of diffuse joint/bone pain for ~ 2 days.  He is provided education regarding docetaxel-induced pain.  He is advised to use pain medication for this discomfort.  He relays 1 episode of being in the sun and about 30 minutes afterwards, getting very hot and nauseated.  He went into a cooled place and the feeling passed.  He denies any cough, sore throat, nasal discharge/congestion, urinary complaints.  Review of Systems  Constitutional: Negative.  Negative for chills, fever and weight loss.  HENT: Negative.   Eyes: Negative.  Negative for double vision.  Respiratory: Negative.  Negative for cough, sputum production and shortness of breath.   Cardiovascular: Negative.  Negative for chest pain.  Gastrointestinal: Negative.  Negative for constipation, diarrhea, nausea and vomiting.  Genitourinary: Negative.  Negative for dysuria, frequency and urgency.  Musculoskeletal: Negative.   Skin: Negative.   Neurological: Negative.  Negative for weakness and headaches.  Endo/Heme/Allergies: Negative.   Psychiatric/Behavioral: Negative.     Past Medical History:  Diagnosis Date  . Adenocarcinoma of right lung ([place]) 07/11/2015  . Bone metastasis ([place]) 08/24/2015  . Cirrhosis of liver ([place]) 08/14/2015   PET on 08/14/2015 demonstrates cirrhotic liver.     Past Surgical History:  Procedure Laterality Date  . right power port placement Right    right subclavian    Family History  Problem Relation Age of Onset  . Cancer Father     thyroid cancer  . Lupus Sister   . Cancer Sister     metastatic ovarian    Social History   Social History  . Marital status:  Married    Spouse name: N/A  . Number of children: N/A  . Years of education: N/A   Social History Main Topics  . Smoking status: Current Every Day Smoker    Packs/day: 2.00  . Smokeless tobacco: Never Used  . Alcohol use No  . Drug use: No  . Sexual activity: Not Asked   Other Topics Concern  . None   Social History Narrative  . None     PHYSICAL EXAMINATION  ECOG PERFORMANCE STATUS: 1 - Symptomatic but completely ambulatory  There were no vitals filed for this visit.  Vitals - 1 value per visit 83/04/1962  SYSTOLIC 229  DIASTOLIC 91  Pulse 798  Temperature 98.4  Respirations 20  Weight (lb) 242    GENERAL:alert, no distress, well nourished, well developed, comfortable, cooperative, smiling and accompanied by wife, in chemo-recliner SKIN: skin color, texture, turgor are normal, no rashes or significant lesions HEAD: Normocephalic, No masses, lesions, tenderness or abnormalities. Right parietal scab noted. EYES: normal, EOMI, Conjunctiva are pink and non-injected EARS: External ears normal OROPHARYNX:lips, buccal mucosa, and tongue normal and mucous membranes are moist  NECK: supple, trachea midline LYMPH:  no palpable  lymphadenopathy BREAST:not examined LUNGS: clear to auscultation  HEART: regular rate & rhythm ABDOMEN:abdomen soft, obese and normal bowel sounds BACK: Back symmetric, no curvature. EXTREMITIES:less then 2 second capillary refill, no joint deformities, effusion, or inflammation, no skin discoloration, no cyanosis, no edema. NEURO: alert & oriented x 3 with fluent speech, no focal motor/sensory deficits, gait normal   LABORATORY DATA: CBC    Component Value Date/Time   WBC 19.6 (H) 11/28/2015 0936   RBC 4.95 11/28/2015 0936   HGB 16.0 11/28/2015 0936   HCT 47.1 11/28/2015 0936   PLT 443 (H) 11/28/2015 0936   MCV 95.2 11/28/2015 0936   MCH 32.3 11/28/2015 0936   MCHC 34.0 11/28/2015 0936   RDW 13.6 11/28/2015 0936   LYMPHSABS 1.8 11/28/2015  0936   MONOABS 0.9 11/28/2015 0936   EOSABS 0.0 11/28/2015 0936   BASOSABS 0.0 11/28/2015 0936      Chemistry      Component Value Date/Time   NA 134 (L) 11/28/2015 0936   K 4.4 11/28/2015 0936   CL 102 11/28/2015 0936   CO2 25 11/28/2015 0936   BUN 15 11/28/2015 0936   CREATININE 0.80 11/28/2015 0936      Component Value Date/Time   CALCIUM 9.3 11/28/2015 0936   ALKPHOS 48 11/28/2015 0936   AST 20 11/28/2015 0936   ALT 23 11/28/2015 0936   BILITOT 0.5 11/28/2015 0936        PENDING LABS:   RADIOGRAPHIC STUDIES:  Nm Pet Image Restag (ps) Skull Base To Thigh  Result Date: 10/31/2015 CLINICAL DATA:  Subsequent treatment strategy for non-small cell lung cancer. EXAM: NUCLEAR MEDICINE PET SKULL BASE TO THIGH TECHNIQUE: 12.11 mCi F-18 FDG was injected intravenously. Full-ring PET imaging was performed from the skull base to thigh after the radiotracer. CT data was obtained and used for attenuation correction and anatomic localization. FASTING BLOOD GLUCOSE:  Value: 111 mg/dl COMPARISON:  08/04/2015 FINDINGS: NECK There is an intense focus of increased uptake within the posterior nasopharynx with an SUV max equal to 12.2. Previously 6.7. There is a new right posterior cervical node which exhibits increased uptake. This measures 8 mm and has an SUV max equal to 5.7. CHEST Hypermetabolic mediastinal adenopathy is identified and appears progressive when compared with previous exam. There are 2 new hypermetabolic pre-vascular lymph nodes, image 61 of series 4. Index node measures 8 mm and has an SUV max equal to 15.56. Index right paratracheal lymph node Measures 1.7 cm and has an SUV max equal to 15.03. Previously this measured 1.8 cm and had an SUV max equal to 13.3. There is no pleural effusion. The index nodule within the right upper lobe measures 1.5 cm and has an SUV max equal to 6.19. Previously this measured 1.2 cm and had an SUV max equal to 5.11. The dominant mass within the right  lower lobe measures 6.1 cm and has an SUV max equal to 21, image 46 of series 8. Previously this measured 6.4 cm and had an SUV max equal to 15.58. ABDOMEN/PELVIS No abnormal hypermetabolic activity within the liver, pancreas, or spleen. The right adrenal gland metastases measures 1.7 x 2.6 cm and has an SUV max equal to 22.8. Previously this measured 1.7 x 2.6 cm and had an SUV max equal to 7.4. Normal appearance of the left adrenal gland. No hypermetabolic lymph nodes in the abdomen or pelvis. SKELETON There is an intense focus of increased radiotracer uptake localizing to the lateral aspect of the right fifth  rib within SUV max equal to 16.6. Underline fracture is identified, image 77 of series 4. There is a hypermetabolic lesion involving the left femoral neck within SUV max equal to 16.7. No corresponding CT abnormality identified. IMPRESSION: 1. Overall there has been interval progression of disease compared with the previous exam. 2. There is an increased FDG uptake associated with the dominant mass involving the right lower lobe. Additionally, there are new hypermetabolic pre-vascular and right posterior cervical lymph nodes. 3. Significant interval increase in FDG uptake associated with right upper lobe pulmonary nodule and right adrenal gland metastases. 4. New hypermetabolic metastasis is involving the left hip. Additionally, there is intense uptake associated with fractured right rib. Underlying lesion not excluded. 5. Increase in FDG uptake associated with lesion involving the posterior nasopharynx. Electronically Signed   By: Kerby Moors M.D.   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  Treated by Dr. Tressie Stalker at [facility] with Carboplatin/Pemetrexed every 21 days x 6 cycles (12/10/2014- 03/25/2015) with transition to  maintenance Pemetrexed every 21 days with last dose being administered on 08/01/2015. Imaging demonstrated progression of disease, resulting in a change in therapy to [place] beginning on 08/29/2015- 10/24/2015.  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 Lisbeth Renshaw. PET scan on 10/31/2015 demonstrated progression of disease resulting in a change in therapy to Docetaxel and Ramucirumab beginning on 11/07/2015.  Oncology history is updated.  Pre-treatment labs today: CBC diff, CMET.  I personally reviewed and went over laboratory results with the patient.  The results are noted within this dictation.  Leukocytosis with neutrophilia is noted.  He did get Neulasta with his last treatment.  His leukocytosis unexpectedly elevated.  He denies any infectious complaints.  He denies any urinary complaints.  Order is placed for UA with reflex to confirm no infection given leukocytosis.  UA is negative for findings suspicious for infection.  We will need to continue to follow TSH given his previous Opdivo treatment.  Return in 3 weeks for follow-up and next cycle of chemotherapy.   ORDERS PLACED FOR THIS ENCOUNTER: Orders Placed This Encounter  Procedures  . Urinalysis, Routine w reflex microscopic    MEDICATIONS PRESCRIBED THIS ENCOUNTER: No orders of the defined types were placed in this encounter.   THERAPY PLAN:  Continue treatment as outlined above: Docetaxel + Ramucirumab  All questions were answered. The patient knows to call the clinic with any problems, questions or concerns. We can certainly see the patient much sooner if necessary.  Patient and plan discussed with Dr. Ancil Linsey and she is in agreement with the aforementioned.   This note is electronically signed by: Doy Mince 11/28/2015 5:19 PM

## 2015-11-28 NOTE — Patient Instructions (Signed)
[facility] Discharge Instructions for Patients Receiving Chemotherapy   Beginning January 23rd 2017 lab work for the [facility] will be done in the  Main lab at [facility] on 1st floor. If you have a lab appointment with the [place] please come in thru the  Main Entrance and check in at the main information desk   Today you received the following chemotherapy agents:  Cyramza and Taxotere  To help prevent nausea and vomiting after your treatment, we encourage you to take your nausea medication as prescribed.  If you develop nausea and vomiting, or diarrhea that is not controlled by your medication, call the clinic.  The clinic phone number is [phone]. Office hours are Monday-Friday 8:30am-5:00pm.  BELOW ARE SYMPTOMS THAT SHOULD BE REPORTED IMMEDIATELY:  *FEVER GREATER THAN 101.0 F  *CHILLS WITH OR WITHOUT FEVER  NAUSEA AND VOMITING THAT IS NOT CONTROLLED WITH YOUR NAUSEA MEDICATION  *UNUSUAL SHORTNESS OF BREATH  *UNUSUAL BRUISING OR BLEEDING  TENDERNESS IN MOUTH AND THROAT WITH OR WITHOUT PRESENCE OF ULCERS  *URINARY PROBLEMS  *BOWEL PROBLEMS  UNUSUAL RASH Items with * indicate a potential emergency and should be followed up as soon as possible. If you have an emergency after office hours please contact your primary care physician or go to the nearest emergency department.  Please call the clinic during office hours if you have any questions or concerns.   You may also contact the Patient Navigator at [phone] should you have any questions or need assistance in obtaining follow up care.      Resources For Cancer Patients and their Caregivers ? American Cancer Society: Can assist with transportation, wigs, general needs, runs Look Good Feel Better.        (847)335-1005 ? Cancer Care: Provides financial assistance, online support groups, medication/co-pay assistance.  1-800-813-HOPE (484)358-2310) ? [place] Assists [place] cancer patients and their families through emotional , educational and financial support.  905-679-7379 ? [place] DSS Where to apply for food stamps, Medicaid and utility assistance. (204)737-4043 ? RCATS: Transportation to medical appointments. 305-237-1932 ? Social Security Administration: May apply for disability if have a Stage IV cancer. [phone])412-0333 [phone] [phone] ? [employer], Disability and Transit Services: Assists with nutrition, care and transit needs. [phone]

## 2015-11-30 ENCOUNTER — Encounter (HOSPITAL_COMMUNITY): Payer: Self-pay | Admitting: Hematology & Oncology

## 2015-12-18 ENCOUNTER — Encounter (HOSPITAL_BASED_OUTPATIENT_CLINIC_OR_DEPARTMENT_OTHER): Payer: BLUE CROSS/BLUE SHIELD

## 2015-12-18 ENCOUNTER — Encounter (HOSPITAL_BASED_OUTPATIENT_CLINIC_OR_DEPARTMENT_OTHER): Payer: BLUE CROSS/BLUE SHIELD | Admitting: Hematology & Oncology

## 2015-12-18 VITALS — BP 105/83 | HR 74 | Temp 98.4°F | Resp 18 | Wt 236.4 lb

## 2015-12-18 DIAGNOSIS — C3491 Malignant neoplasm of unspecified part of right bronchus or lung: Secondary | ICD-10-CM

## 2015-12-18 DIAGNOSIS — C7951 Secondary malignant neoplasm of bone: Secondary | ICD-10-CM

## 2015-12-18 DIAGNOSIS — Z5112 Encounter for antineoplastic immunotherapy: Secondary | ICD-10-CM

## 2015-12-18 DIAGNOSIS — Z720 Tobacco use: Secondary | ICD-10-CM

## 2015-12-18 DIAGNOSIS — C792 Secondary malignant neoplasm of skin: Secondary | ICD-10-CM

## 2015-12-18 DIAGNOSIS — Z7189 Other specified counseling: Secondary | ICD-10-CM

## 2015-12-18 DIAGNOSIS — Z5111 Encounter for antineoplastic chemotherapy: Secondary | ICD-10-CM | POA: Diagnosis not present

## 2015-12-18 DIAGNOSIS — C7931 Secondary malignant neoplasm of brain: Secondary | ICD-10-CM | POA: Diagnosis not present

## 2015-12-18 LAB — COMPREHENSIVE METABOLIC PANEL
ALK PHOS: 55 U/L (ref 38–126)
ALT: 28 U/L (ref 17–63)
ANION GAP: 6 (ref 5–15)
AST: 26 U/L (ref 15–41)
Albumin: 3.8 g/dL (ref 3.5–5.0)
BILIRUBIN TOTAL: 0.5 mg/dL (ref 0.3–1.2)
BUN: 13 mg/dL (ref 6–20)
CALCIUM: 9.3 mg/dL (ref 8.9–10.3)
CO2: 25 mmol/L (ref 22–32)
CREATININE: 0.83 mg/dL (ref 0.61–1.24)
Chloride: 103 mmol/L (ref 101–111)
Glucose, Bld: 132 mg/dL — ABNORMAL HIGH (ref 65–99)
Potassium: 4.9 mmol/L (ref 3.5–5.1)
Sodium: 134 mmol/L — ABNORMAL LOW (ref 135–145)
TOTAL PROTEIN: 7.3 g/dL (ref 6.5–8.1)

## 2015-12-18 LAB — CBC WITH DIFFERENTIAL/PLATELET
Basophils Absolute: 0 10*3/uL (ref 0.0–0.1)
Basophils Relative: 0 %
EOS ABS: 0 10*3/uL (ref 0.0–0.7)
EOS PCT: 0 %
HCT: 47.3 % (ref 39.0–52.0)
HEMOGLOBIN: 16.1 g/dL (ref 13.0–17.0)
LYMPHS ABS: 1.5 10*3/uL (ref 0.7–4.0)
LYMPHS PCT: 9 %
MCH: 32.5 pg (ref 26.0–34.0)
MCHC: 34 g/dL (ref 30.0–36.0)
MCV: 95.4 fL (ref 78.0–100.0)
MONOS PCT: 1 %
Monocytes Absolute: 0.2 10*3/uL (ref 0.1–1.0)
NEUTROS PCT: 90 %
Neutro Abs: 16 10*3/uL — ABNORMAL HIGH (ref 1.7–7.7)
Platelets: 351 10*3/uL (ref 150–400)
RBC: 4.96 MIL/uL (ref 4.22–5.81)
RDW: 15 % (ref 11.5–15.5)
WBC: 17.7 10*3/uL — AB (ref 4.0–10.5)

## 2015-12-18 LAB — TSH: TSH: 0.457 u[IU]/mL (ref 0.350–4.500)

## 2015-12-18 MED ORDER — DIPHENHYDRAMINE HCL 50 MG/ML IJ SOLN
50.0000 mg | Freq: Once | INTRAMUSCULAR | Status: AC
  Administered 2015-12-18: 50 mg via INTRAVENOUS
  Filled 2015-12-18: qty 1

## 2015-12-18 MED ORDER — SODIUM CHLORIDE 0.9 % IV SOLN
Freq: Once | INTRAVENOUS | Status: AC
  Administered 2015-12-18: 10:00:00 via INTRAVENOUS

## 2015-12-18 MED ORDER — ACETAMINOPHEN 325 MG PO TABS
650.0000 mg | ORAL_TABLET | Freq: Once | ORAL | Status: AC
Start: 2015-12-18 — End: 2015-12-18
  Administered 2015-12-18: 650 mg via ORAL
  Filled 2015-12-18: qty 2

## 2015-12-18 MED ORDER — HEPARIN SOD (PORK) LOCK FLUSH 100 UNIT/ML IV SOLN
500.0000 [IU] | Freq: Once | INTRAVENOUS | Status: AC | PRN
  Administered 2015-12-18: 500 [IU]
  Filled 2015-12-18: qty 5

## 2015-12-18 MED ORDER — PEGFILGRASTIM 6 MG/0.6ML ~~LOC~~ PSKT
6.0000 mg | PREFILLED_SYRINGE | Freq: Once | SUBCUTANEOUS | Status: AC
  Administered 2015-12-18: 6 mg via SUBCUTANEOUS
  Filled 2015-12-18: qty 0.6

## 2015-12-18 MED ORDER — COLD PACK MISC ONCOLOGY: 1.0000 | Freq: Once | Status: DC | PRN

## 2015-12-18 MED ORDER — SODIUM CHLORIDE 0.9 % IV SOLN
10.0000 mg/kg | Freq: Once | INTRAVENOUS | Status: AC
  Administered 2015-12-18: 1100 mg via INTRAVENOUS
  Filled 2015-12-18: qty 10

## 2015-12-18 MED ORDER — DOCETAXEL CHEMO INJECTION 160 MG/16ML
75.0000 mg/m2 | Freq: Once | INTRAVENOUS | Status: AC
  Administered 2015-12-18: 170 mg via INTRAVENOUS
  Filled 2015-12-18: qty 17

## 2015-12-18 MED ORDER — DENOSUMAB 120 MG/1.7ML ~~LOC~~ SOLN
120.0000 mg | Freq: Once | SUBCUTANEOUS | Status: AC
  Administered 2015-12-18: 120 mg via SUBCUTANEOUS
  Filled 2015-12-18: qty 1.7

## 2015-12-18 MED ORDER — DEXAMETHASONE SODIUM PHOSPHATE 10 MG/ML IJ SOLN
10.0000 mg | Freq: Once | INTRAMUSCULAR | Status: AC
Start: 2015-12-18 — End: 2015-12-18
  Administered 2015-12-18: 10 mg via INTRAVENOUS
  Filled 2015-12-18: qty 1

## 2015-12-18 MED ORDER — SODIUM CHLORIDE 0.9% FLUSH
10.0000 mL | INTRAVENOUS | Status: DC | PRN
  Administered 2015-12-18: 10 mL
  Filled 2015-12-18: qty 10

## 2015-12-18 NOTE — Patient Instructions (Signed)
[place] at [facility] Discharge Instructions  RECOMMENDATIONS MADE BY THE CONSULTANT AND ANY TEST RESULTS WILL BE SENT TO YOUR REFERRING PHYSICIAN.  You saw Dr.Penland today. Pet will be scheduled for 12/11. Keep follow up with Tom in 12/14 with labs and chemo also. See Amy at checkout for appointments.  Thank you for choosing [place] at [facility] to provide your oncology and hematology care.  To afford each patient quality time with our provider, please arrive at least 15 minutes before your scheduled appointment time.   Beginning January 23rd 2017 lab work for the [facility] will be done in the  Main lab at [facility] on 1st floor. If you have a lab appointment with the [place] please come in thru the  Main Entrance and check in at the main information desk  You need to re-schedule your appointment should you arrive 10 or more minutes late.  We strive to give you quality time with our providers, and arriving late affects you and other patients whose appointments are after yours.  Also, if you no show three or more times for appointments you may be dismissed from the clinic at the providers discretion.     Again, thank you for choosing [facility].  Our hope is that these requests will decrease the amount of time that you wait before being seen by our physicians.       _____________________________________________________________  Should you have questions after your visit to [facility], please contact our office at [phone] between the hours of 8:30 a.m. and 4:30 p.m.  Voicemails left after 4:30 p.m. will not be returned until the following business day.  For prescription refill requests, have your pharmacy contact our office.         Resources For Cancer Patients and their Caregivers ? American Cancer Society: Can assist with transportation, wigs, general needs, runs Look Good Feel  Better.        6036409907 ? Cancer Care: Provides financial assistance, online support groups, medication/co-pay assistance.  1-800-813-HOPE (260)286-3076) ? [place] Assists [place] cancer patients and their families through emotional , educational and financial support.  (305)023-3614 ? [place] DSS Where to apply for food stamps, Medicaid and utility assistance. 910-492-5384 ? RCATS: Transportation to medical appointments. 8122103022 ? Social Security Administration: May apply for disability if have a Stage IV cancer. 336-028-3547 332-595-7377 ? [employer], Disability and Transit Services: Assists with nutrition, care and transit needs. [place] Support Programs: '@10RELATIVEDAYS'$ @ > Cancer Support Group  2nd Tuesday of the month 1pm-2pm, Journey Room  > Creative Journey  3rd Tuesday of the month 1130am-1pm, Journey Room  > Look Good Feel Better  1st Wednesday of the month 10am-12 noon, Journey Room (Call [place] to register [phone])

## 2015-12-18 NOTE — Progress Notes (Signed)
Carl Simmons presents today for injection per MD orders. Xgeva 120 mg administered SQ in left Abdomen. Administration without incident. Patient tolerated well.  Marland KitchenPaschal Simmons arrived today for [facility] neulasta on body injector. See MAR for administration details. Injector in place and engaged with green light indicator on flashing. Tolerated application with out problems.   Tolerated chemo well. Stable and ambulatory on discharge home to self.

## 2015-12-18 NOTE — Patient Instructions (Signed)
[facility] Discharge Instructions for Patients Receiving Chemotherapy   Beginning January 23rd 2017 lab work for the [facility] will be done in the  Main lab at [facility] on 1st floor. If you have a lab appointment with the [place] please come in thru the  Main Entrance and check in at the main information desk   Today you received the following chemotherapy agents taxotere and cyramza. You received xgeva injection. Neulasta onpro will dispense medication between 415 pm and 515 pm tomorrow. You may remove device after 530 pm tomorrow.  Return as scheduled.  If you develop nausea and vomiting, or diarrhea that is not controlled by your medication, call the clinic.  The clinic phone number is [phone]. Office hours are Monday-Friday 8:30am-5:00pm.  BELOW ARE SYMPTOMS THAT SHOULD BE REPORTED IMMEDIATELY:  *FEVER GREATER THAN 101.0 F  *CHILLS WITH OR WITHOUT FEVER  NAUSEA AND VOMITING THAT IS NOT CONTROLLED WITH YOUR NAUSEA MEDICATION  *UNUSUAL SHORTNESS OF BREATH  *UNUSUAL BRUISING OR BLEEDING  TENDERNESS IN MOUTH AND THROAT WITH OR WITHOUT PRESENCE OF ULCERS  *URINARY PROBLEMS  *BOWEL PROBLEMS  UNUSUAL RASH Items with * indicate a potential emergency and should be followed up as soon as possible. If you have an emergency after office hours please contact your primary care physician or go to the nearest emergency department.  Please call the clinic during office hours if you have any questions or concerns.   You may also contact the Patient Navigator at [phone] should you have any questions or need assistance in obtaining follow up care.      Resources For Cancer Patients and their Caregivers ? American Cancer Society: Can assist with transportation, wigs, general needs, runs Look Good Feel Better.        331-857-7268 ? Cancer Care: Provides financial assistance, online support groups, medication/co-pay assistance.   1-800-813-HOPE 617-228-1729) ? [place] Assists [place] cancer patients and their families through emotional , educational and financial support.  772-702-8812 ? [place] DSS Where to apply for food stamps, Medicaid and utility assistance. 629-866-4520 ? RCATS: Transportation to medical appointments. (325) 673-5877 ? Social Security Administration: May apply for disability if have a Stage IV cancer. 806 109 2319 [phone] ? [employer], Disability and Transit Services: Assists with nutrition, care and transit needs. [phone]

## 2015-12-18 NOTE — Progress Notes (Signed)
Carl Simmons, [address] [place] [address]  DIAGNOSIS:    Adenocarcinoma of right lung [facility])   11/20/2014 Procedure    Right scalp skin biopsy      11/20/2014 Pathology Results    Adenocarcinoma of lung primary. PDL-1 NEGATIVE (0% tumor proportion score); EGFR mutation NEGATIVE; no evidence of ALK gene rearrangement detected by FISH; no reaarnagement of ROS1 gene detected.      11/29/2014 PET scan    Large hypermetabolic mass within the RLL with central necrosis. Hypermetabolic right suprahilar mass with postobstructive collapse of the RUL. Subcarinal, paratracheal, prevascular and R internal mammary nodal metastases. Small R adrenal gland nodule.      12/10/2014 - 03/25/2015 Chemotherapy    Carboplatin and pemetrexed 6 cycles      02/11/2015 PET scan    Partial metabolic response to therapy, with decrease in hypermetabolic right lung masses, postobstructive right upper lobe consolidation, thoracic lymphadenopathy and right adrenal metastasis. No new or progressive disease identified.      04/05/2015 PET scan    No significant change in hypermetabolic right lung masses and thoracic lymphadenopathy. No new or progressive disease identified.      04/15/2015 - 08/01/2015 Chemotherapy    Pemetrexed maintenance      08/14/2015 PET scan    Overall progression of metastatic lung cancer. Increasingly hypermetabolic mediastinal lymph nodes and R lung masses, enlarging R adrenal mass and new osseous lesions. Enlarging hypermetabolic R-sided scalp nodules.      08/14/2015 Progression    Progression of disease on PET imaging      08/29/2015 - 10/24/2015 Chemotherapy    Opdivo      08/29/2015 Miscellaneous    Xgeva started monthly      09/05/2015 Imaging    MRI brain- Multiple metastatic deposits in the brain. At least 9 lesions are identified. Mild edema around the 10 mm lesion in the right cerebellum.      09/27/2015 - 09/27/2015 Radiation Therapy    Dr. Lisbeth Renshaw-  SRS to brain mets:  PTV target 1-10 were treated using 2 treatment plans. A single isocenter technique was utilized to treat 8 lesion simultaneously. PTV's 2 and 5 were then treated simultaneously with a second radiation treatment plan due to their close proximity. A prescription dose of 20 Gy was delivered to each lesion. ExacTrac Snap verification was performed for each couch angle.       10/31/2015 PET scan    1. Overall there has been interval progression of disease compared with the previous exam. 2. There is an increased FDG uptake associated with the dominant mass involving the right lower lobe. Additionally, there are new hypermetabolic pre-vascular and right posterior cervical lymph nodes. 3. Significant interval increase in FDG uptake associated with right upper lobe pulmonary nodule and right adrenal gland metastases. 4. New hypermetabolic metastasis is involving the left hip. Additionally, there is intense uptake associated with fractured right rib. Underlying lesion not excluded. 5. Increase in FDG uptake associated with lesion involving the posterior nasopharynx.      10/31/2015 Progression    Progression of disease      11/07/2015 Treatment Plan Change    D/C Opdivo and change to Docetaxel + Ramucirumab      11/07/2015 -  Chemotherapy    Docetaxel + Ramucirumab        INTERVAL HISTORY: Carl Simmons 55 y.o. 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  Treated by Dr. Tressie Stalker at [facility] with Carboplatin/Pemetrexed every 21 days x 6 cycles (12/10/2014- 03/25/2015) with transition to maintenance Pemetrexed every 21 days with last dose being administered on 08/01/2015 with imaging following demonstrating progression of disease, resulting in a change in therapy to [place] beginning on 8/3/201- 10/24/2015. He has completed stereotactic XRT in [place]. PET scan on 10/31/2015  demonstrated progression of disease resulting in a change in therapy to Docetaxel and Ramucirumab beginning on 11/07/2015.  He is accompanied by his wife and here for cycle 3 of Docetaxel/Ramucirumab.  Patient reports poor appetite. He denies nausea, but says he cannot taste food. Yeiren says he sleeps 20/24 hours of the day. He notes however that he is mostly "bored" and the weather is too cold to do anything. He continues to smoke.  He denies headaches. No bleeding or bruising. No vomiting or diarrhea.  Review of Systems  Constitutional: Positive for malaise/fatigue. Negative for chills and fever.       Patient sleeps most of the day Cannot taste food  HENT: Negative.   Eyes: Negative.   Respiratory: Negative.  Negative for cough.   Cardiovascular: Negative.  Negative for chest pain.  Gastrointestinal: Negative.   Genitourinary: Negative.   Musculoskeletal: Negative.   Skin: Negative.        Patient has right ankle lesion 3-4 days ago; doesn't hurt.  Neurological: Negative.  Negative for weakness.  Endo/Heme/Allergies: Negative.   Psychiatric/Behavioral: Negative.   14 point review of systems was performed and is negative except as detailed under history of present illness and above  Past Medical History:  Diagnosis Date  . Adenocarcinoma of right lung ([place]) 07/11/2015  . Bone metastasis ([place]) 08/24/2015  . Cirrhosis of liver ([place]) 08/14/2015   PET on 08/14/2015 demonstrates cirrhotic liver.     Past Surgical History:  Procedure Laterality Date  . right power port placement Right    right subclavian    Family History  Problem Relation Age of Onset  . Cancer Father     thyroid cancer  . Lupus Sister   . Cancer Sister     metastatic ovarian    Social History   Social History  . Marital status: Married    Spouse name: N/A  . Number of children: N/A  . Years of education: N/A   Social History Main Topics  . Smoking status: Current Every Day Smoker    Packs/day:  2.00  . Smokeless tobacco: Never Used  . Alcohol use No  . Drug use: No  . Sexual activity: Not on file   Other Topics Concern  . Not on file   Social History Narrative  . No narrative on file    PHYSICAL EXAMINATION  ECOG PERFORMANCE STATUS: 2 - Symptomatic, <50% confined to bed  There were no vitals filed for this visit.  Vitals with BMI 12/18/2015  Height   Weight 236 lbs 6 oz  BMI   Systolic 295  Diastolic 90  Pulse 98  Respirations 20   GENERAL:alert, no distress, well nourished, well developed, comfortable, cooperative, obese, smiling and accompanied by his wife.   SKIN: skin color, texture, turgor are normal, no rashes or significant lesions HEAD: Normocephalic  Lesion on scalp 0.5 cm posterior to the right ear EYES: normal, EOMI, Conjunctiva are pink and non-injected EARS: External ears normal OROPHARYNX:lips, buccal mucosa, and tongue normal and mucous membranes are moist  NECK: supple, trachea midline  LYMPH:  no palpable lymphadenopathy BREAST:not examined LUNGS: clear to auscultation and percussion HEART: regular rate & rhythm, no murmurs, no gallops, S1 normal and S2 normal ABDOMEN:abdomen soft, non-tender, obese and normal bowel sounds BACK: Back symmetric, no curvature. EXTREMITIES:less then 2 second capillary refill, no joint deformities, effusion, or inflammation, no edema, no cyanosis  Lesion 0.5cm on right ankle, subcutaneous, mobile NEURO: alert & oriented x 3 with fluent speech, no focal motor/sensory deficits, gait normal   LABORATORY DATA: I have reviewed the data as listed. Results for HUGH, KAMARA (MRN [id number]) as of 12/18/2015 11:18  Ref. Range 12/18/2015 08:39  Sodium Latest Ref Range: 135 - 145 mmol/L 134 (L)  Potassium Latest Ref Range: 3.5 - 5.1 mmol/L 4.9  Chloride Latest Ref Range: 101 - 111 mmol/L 103  CO2 Latest Ref Range: 22 - 32 mmol/L 25  BUN Latest Ref Range: 6 - 20 mg/dL 13  Creatinine Latest Ref Range: 0.61 - 1.24  mg/dL 0.83  Calcium Latest Ref Range: 8.9 - 10.3 mg/dL 9.3  EGFR (Non-African Amer.) Latest Ref Range: >60 mL/min >60  EGFR (African American) Latest Ref Range: >60 mL/min >60  Glucose Latest Ref Range: 65 - 99 mg/dL 132 (H)  Anion gap Latest Ref Range: 5 - 15  6  Alkaline Phosphatase Latest Ref Range: 38 - 126 U/L 55  Albumin Latest Ref Range: 3.5 - 5.0 g/dL 3.8  AST Latest Ref Range: 15 - 41 U/L 26  ALT Latest Ref Range: 17 - 63 U/L 28  Total Protein Latest Ref Range: 6.5 - 8.1 g/dL 7.3  Total Bilirubin Latest Ref Range: 0.3 - 1.2 mg/dL 0.5  WBC Latest Ref Range: 4.0 - 10.5 K/uL 17.7 (H)  RBC Latest Ref Range: 4.22 - 5.81 MIL/uL 4.96  Hemoglobin Latest Ref Range: 13.0 - 17.0 g/dL 16.1  HCT Latest Ref Range: 39.0 - 52.0 % 47.3  MCV Latest Ref Range: 78.0 - 100.0 fL 95.4  MCH Latest Ref Range: 26.0 - 34.0 pg 32.5  MCHC Latest Ref Range: 30.0 - 36.0 g/dL 34.0  RDW Latest Ref Range: 11.5 - 15.5 % 15.0  Platelets Latest Ref Range: 150 - 400 K/uL 351  Neutrophils Latest Units: % 90  Lymphocytes Latest Units: % 9  Monocytes Relative Latest Units: % 1  Eosinophil Latest Units: % 0  Basophil Latest Units: % 0  NEUT# Latest Ref Range: 1.7 - 7.7 K/uL 16.0 (H)  Lymphocyte # Latest Ref Range: 0.7 - 4.0 K/uL 1.5  Monocyte # Latest Ref Range: 0.1 - 1.0 K/uL 0.2  Eosinophils Absolute Latest Ref Range: 0.0 - 0.7 K/uL 0.0  Basophils Absolute Latest Ref Range: 0.0 - 0.1 K/uL 0.0  TSH Latest Ref Range: 0.350 - 4.500 uIU/mL 0.457   RADIOGRAPHIC STUDIES: I have personally reviewed the radiological images as listed and agreed with the findings in the report. CLINICAL DATA:  Subsequent treatment strategy for non-small cell lung cancer.  EXAM: NUCLEAR MEDICINE PET SKULL BASE TO THIGH  TECHNIQUE: 12.11 mCi F-18 FDG was injected intravenously. Full-ring PET imaging was performed from the skull base to thigh after the radiotracer. CT data was obtained and used for attenuation correction and  anatomic localization.  FASTING BLOOD GLUCOSE:  Value: 111 mg/dl  COMPARISON:  08/04/2015  FINDINGS: NECK  There is an intense focus of increased uptake within the posterior nasopharynx with an SUV max equal to 12.2. Previously 6.7. There is a new right posterior cervical node which exhibits increased uptake. This measures 8 mm  and has an SUV max equal to 5.7.  CHEST  Hypermetabolic mediastinal adenopathy is identified and appears progressive when compared with previous exam. There are 2 new hypermetabolic pre-vascular lymph nodes, image 61 of series 4. Index node measures 8 mm and has an SUV max equal to 15.56. Index right paratracheal lymph node Measures 1.7 cm and has an SUV max equal to 15.03. Previously this measured 1.8 cm and had an SUV max equal to 13.3.  There is no pleural effusion. The index nodule within the right upper lobe measures 1.5 cm and has an SUV max equal to 6.19. Previously this measured 1.2 cm and had an SUV max equal to 5.11. The dominant mass within the right lower lobe measures 6.1 cm and has an SUV max equal to 21, image 46 of series 8. Previously this measured 6.4 cm and had an SUV max equal to 15.58.  ABDOMEN/PELVIS  No abnormal hypermetabolic activity within the liver, pancreas, or spleen. The right adrenal gland metastases measures 1.7 x 2.6 cm and has an SUV max equal to 22.8. Previously this measured 1.7 x 2.6 cm and had an SUV max equal to 7.4. Normal appearance of the left adrenal gland. No hypermetabolic lymph nodes in the abdomen or pelvis.  SKELETON  There is an intense focus of increased radiotracer uptake localizing to the lateral aspect of the right fifth rib within SUV max equal to 16.6. Underline fracture is identified, image 77 of series 4. There is a hypermetabolic lesion involving the left femoral neck within SUV max equal to 16.7. No corresponding CT abnormality identified.  IMPRESSION: 1. Overall there has  been interval progression of disease compared with the previous exam. 2. There is an increased FDG uptake associated with the dominant mass involving the right lower lobe. Additionally, there are new hypermetabolic pre-vascular and right posterior cervical lymph nodes. 3. Significant interval increase in FDG uptake associated with right upper lobe pulmonary nodule and right adrenal gland metastases. 4. New hypermetabolic metastasis is involving the left hip. Additionally, there is intense uptake associated with fractured right rib. Underlying lesion not excluded. 5. Increase in FDG uptake associated with lesion involving the posterior nasopharynx.   Electronically Signed   By: Kerby Moors M.D.   On: 10/31/2015 15:14    PATHOLOGY:    ASSESSMENT AND PLAN:  Adenocarcinoma of right lung, stage IV  Scalp/skin metastases Dizziness Bone metastases Tobacco Abuse  Will proceed with therapy today. Labs reviewed, results are noted above.   I highly encouraged the patient to quit smoking.   End of life discussion/goals of care were addressed today. He remains full code.   Patient reports sleeping most of the day. I recommended that he try to be more active. We discussed his PS and his ability to tolerate therapy.   After cycle 4 of treatment, which will be the second week of December, I will order restaging PET for around December 14. Follow up with patient in three weeks.  Orders Placed This Encounter  Procedures  . NM PET Image Restag (PS) Skull Base To Thigh    Standing Status:   Future    Number of Occurrences:   1    Standing Expiration Date:   12/17/2016    Order Specific Question:   Reason for Exam (SYMPTOM  OR DIAGNOSIS REQUIRED)    Answer:   restaging stage IV NSCLC    Order Specific Question:   Preferred imaging location?    Answer:   [facility]    Order Specific Question:   If indicated for the ordered procedure, I authorize the administration of a  radiopharmaceutical per Radiology protocol    Answer:   Yes  . CBC with Differential    Standing Status:   Future    Number of Occurrences:   1    Standing Expiration Date:   12/17/2016  . Comprehensive metabolic panel    Standing Status:   Future    Number of Occurrences:   1    Standing Expiration Date:   12/17/2016    All questions were answered. The patient knows to call the clinic with any problems, questions or concerns. We can certainly see the patient much sooner if necessary.  This document serves as a record of services personally performed by Ancil Linsey, MD. It was created on her behalf by Elmyra Ricks, a trained medical scribe. The creation of this record is based on the scribe's personal observations and the provider's statements to them. This document has been checked and approved by the attending provider.  I have reviewed the above documentation for accuracy and completeness and I agree with the above.  This note is electronically signed by: Molli Hazard, MD   12/18/2015 11:19 AM

## 2015-12-26 ENCOUNTER — Ambulatory Visit
Admission: RE | Admit: 2015-12-26 | Discharge: 2015-12-26 | Disposition: A | Discharge status: Home / Self Care | Payer: BLUE CROSS/BLUE SHIELD | Source: Ambulatory Visit | Attending: Radiation Oncology | Admitting: Radiation Oncology

## 2015-12-26 DIAGNOSIS — C7949 Secondary malignant neoplasm of other parts of nervous system: Principal | ICD-10-CM

## 2015-12-26 DIAGNOSIS — C7931 Secondary malignant neoplasm of brain: Secondary | ICD-10-CM

## 2015-12-26 MED ORDER — GADOBENATE DIMEGLUMINE 529 MG/ML IV SOLN: 20.0000 mL | Freq: Once | INTRAVENOUS | Status: DC | PRN

## 2015-12-27 NOTE — Progress Notes (Signed)
[name] is here for follow up appointment history of adenocarcinoma of right lung with brain metastasis.  Headache-frequency,location: None Fatigue: Having fatigue all the time. Hair loss:yes all over his body. Erythema/Hyperpigmentation of forehead,scalp and ears: Dry skin on forehead. Auditory changes(manifest post radiation): None Nausea/vomiting Ataxia(unsteady gait): None Vision (Blurred/double vision,blind spots, and peripheral vsion changes): Blurred vision,if he looks up to the right he gets dizzy Fine motor movement-picking up objects with fingers,holding objects, writing, weakness of lower extremities: None Cognitive changes: None  Aphasia/Slurred speech: None Weight changes, if any: Wt Readings from Last 3 Encounters:  12/30/15 233 lb (105.7 kg)  12/18/15 236 lb 6.4 oz (107.2 kg)  11/28/15 242 lb (109.8 kg)   Respiratory complaints, if any: SOB with exertion,coughing up clear secretion, occasional wheezing Hemoptysis, if any:No Pain:No Chemotherapy: Xgeva monthly every 21 days Lab: 12-18-15 CBC w diff, Cmet BP 109/87   Pulse 96   Temp 97.7 F (36.5 C) (Oral)   Resp 20   Ht '5\' 8"'$  (1.727 m)   Wt 233 lb (105.7 kg)   SpO2 98%   BMI 35.43 kg/m

## 2015-12-30 ENCOUNTER — Ambulatory Visit
Admission: RE | Admit: 2015-12-30 | Discharge: 2015-12-30 | Disposition: A | Discharge status: Home / Self Care | Payer: BLUE CROSS/BLUE SHIELD | Source: Ambulatory Visit | Attending: Radiation Oncology | Admitting: Radiation Oncology

## 2015-12-30 VITALS — BP 109/87 | HR 96 | Temp 97.7°F | Resp 20 | Ht 68.0 in | Wt 233.0 lb

## 2015-12-30 DIAGNOSIS — C3431 Malignant neoplasm of lower lobe, right bronchus or lung: Secondary | ICD-10-CM | POA: Insufficient documentation

## 2015-12-30 DIAGNOSIS — F17210 Nicotine dependence, cigarettes, uncomplicated: Secondary | ICD-10-CM | POA: Diagnosis not present

## 2015-12-30 DIAGNOSIS — C7931 Secondary malignant neoplasm of brain: Secondary | ICD-10-CM | POA: Insufficient documentation

## 2015-12-30 DIAGNOSIS — Z79899 Other long term (current) drug therapy: Secondary | ICD-10-CM | POA: Insufficient documentation

## 2015-12-30 NOTE — Progress Notes (Signed)
Radiation Oncology         [phone] ________________________________  Name: Carl Simmons MRN: [id number]  Date: 12/30/2015  DOB: [birth year]  HQ:IONG Jan Fireman, MD  [name], MD     REFERRING PHYSICIAN: [name], MD   DIAGNOSIS: The primary encounter diagnosis was Brain metastases ([place]). A diagnosis of Malignant neoplasm of lower lobe of right lung [facility]) was also pertinent to this visit.   HISTORY OF PRESENT ILLNESS: Carl Simmons is a 55 y.o. 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 The patient was treated with SRS to 10 lesions in September 2017 and comes for his first post treatment surveillance scan. This was performed on 12/26/2015 and did not reveal any new evidence of metastasis, the treated lesions were nearly resolved, with out evidence of edema, there is a cystic structure in the posterior pharynx along the tonsillar region, that is stable in comparison to his MRI scan in August 2017. He states that he would like to follow up in the evening office for subsequent scans. He also remains on current systemic therapy with Dr. Whitney Muse. He is planning PET scan on Monday of next week to determine his response to therapy.  On review of systems, the patient reports that he is doing well overall. He continues to have weekly eyes, and states that his skin is been very dry since seeding radiotherapy. He denies any difficulty with fine motor skills, speech, or movement. He denies any auditory changes. He does have blurry vision and he looks peripherally to the right, and occasionally has dizziness with this. This results without any intervention. He continues to have shortness of breath with exertion, and coughs up clear phlegm with occasional wheezing. He denies any chest pain,  fevers, chills, night sweats. He denies any bowel or bladder disturbances, and denies abdominal pain, nausea or vomiting. He denies any new  musculoskeletal or joint aches or pains. A complete review of systems is obtained and is otherwise negative.   PREVIOUS RADIATION THERAPY: Yes   09/27/2015 SRS Treatment: PTV target 1-10 were treated using 2 treatment plans. A single isocenter technique was utilized to treat 8 lesion simultaneously. PTV's 2 and 5 were then treated simultaneously with a second radiation treatment plan due to their close proximity. A prescription dose of 20 Gy was delivered to each lesion. ExacTrac Snap verification was performed for each couch angle.    PAST MEDICAL HISTORY:  Past Medical History:  Diagnosis Date  . Adenocarcinoma of right lung ([place]) 07/11/2015  . Bone metastasis ([place]) 08/24/2015  . Cirrhosis of liver ([place]) 08/14/2015   PET on 08/14/2015 demonstrates cirrhotic liver.        PAST SURGICAL HISTORY: Past Surgical History:  Procedure Laterality Date  . right power port placement Right    right subclavian     FAMILY HISTORY:  Family History  Problem Relation Age of Onset  . Cancer Father     thyroid cancer  . Lupus Sister   . Cancer Sister     metastatic ovarian     SOCIAL HISTORY:  reports that he has been smoking.  He has been smoking about 2.00 packs per day. He has never used smokeless tobacco. He reports that he does not drink alcohol or use drugs. The patient is accompanied by his wife.   ALLERGIES: Patient has no known allergies.   MEDICATIONS:  Current Outpatient Prescriptions  Medication Sig  Dispense Refill  . cyanocobalamin (,VITAMIN B-12,) 1000 MCG/ML injection Inject 1,000 mcg into the muscle every 30 (thirty) days.    Marland Kitchen dexamethasone (DECADRON) 4 MG tablet Take 2 tablets (8 mg total) by mouth 2 (two) times daily. Start the day before Taxotere. Then daily after chemo for 2 days. 30 tablet 1  . folic acid (FOLVITE) 1 MG tablet Take 1 mg by mouth daily.     Marland Kitchen HYDROcodone-acetaminophen (NORCO/VICODIN) 5-325 MG tablet Take 1 tablet by mouth every 4 (four) hours as  needed.    . lidocaine-prilocaine (EMLA) cream Apply topically as needed.    . naproxen (NAPROSYN) 500 MG tablet Take 500 mg by mouth 2 (two) times daily as needed.    . Nivolumab (OPDIVO IV) Inject into the vein. Every 2 weeks    . Omega-3 Fatty Acids (FISH OIL) 1000 MG CAPS TAKE 1 CAPSULE BY MOUTH EVERY DAY 30 capsule 1  . potassium chloride SA (K-DUR,KLOR-CON) 20 MEQ tablet Take 1 tablet (20 mEq total) by mouth 2 (two) times daily. 60 tablet 0  . predniSONE (DELTASONE) 20 MG tablet Take at the onset of diarrhea.  Take 5 tablets ('100mg'$ ) at one time.  Then call the [place] 30 tablet 0   No current facility-administered medications for this encounter.     PHYSICAL EXAM:  Wt Readings from Last 3 Encounters:  12/18/15 236 lb 6.4 oz (107.2 kg)  11/28/15 242 lb (109.8 kg)  11/07/15 242 lb (109.8 kg)   Temp Readings from Last 3 Encounters:  12/18/15 98.4 F (36.9 C) (Oral)  11/28/15 98 F (36.7 C) (Oral)  11/07/15 97.4 F (36.3 C) (Oral)   BP Readings from Last 3 Encounters:  12/18/15 105/83  11/28/15 100/78  11/07/15 111/73   Pulse Readings from Last 3 Encounters:  12/18/15 74  11/28/15 89  11/07/15 79     Pain scale 0/10 In general this is a well appearing Caucasian male in no acute distress. He's alert and oriented x4 and appropriate throughout the examination. Cardiopulmonary assessment is negative for acute distress and he exhibits normal effort. He has multiple excoriations of the skin, with a hyperkeratotic changes of the skin of the forehead anteriorly   ECOG = 1  0 - Asymptomatic (Fully active, able to carry on all predisease activities without restriction)  1 - Symptomatic but completely ambulatory (Restricted in physically strenuous activity but ambulatory and able to carry out work of a light or sedentary nature. For example, light housework, office work)  2 - Symptomatic, <50% in bed during the day (Ambulatory and capable of all self care but unable to  carry out any work activities. Up and about more than 50% of waking hours)  3 - Symptomatic, >50% in bed, but not bedbound (Capable of only limited self-care, confined to bed or chair 50% or more of waking hours)  4 - Bedbound (Completely disabled. Cannot carry on any self-care. Totally confined to bed or chair)  5 - Death   Eustace Pen MM, Creech RH, Tormey DC, et al. [phone]). "Toxicity and response criteria of the [employer] [facility] [facility] [employer] [facility]". [place] Oncol. 5 (6): 649-55    LABORATORY DATA:  Lab Results  Component Value Date   WBC 17.7 (H) 12/18/2015   HGB 16.1 12/18/2015   HCT 47.3 12/18/2015   MCV 95.4 12/18/2015   PLT 351 12/18/2015   Lab Results  Component Value Date   NA 134 (L) 12/18/2015   K 4.9 12/18/2015   CL  103 12/18/2015   CO2 25 12/18/2015   Lab Results  Component Value Date   ALT 28 12/18/2015   AST 26 12/18/2015   ALKPHOS 55 12/18/2015   BILITOT 0.5 12/18/2015      RADIOGRAPHY: Mr [name] GF Contrast  Result Date: 12/26/2015 CLINICAL DATA:  55 y/o  M; lung cancer.  SRS restaging. EXAM: MRI HEAD WITHOUT AND WITH CONTRAST TECHNIQUE: Multiplanar, multiecho pulse sequences of the brain and surrounding structures were obtained without and with intravenous contrast. CONTRAST:  20 cc MultiHance. COMPARISON:  09/16/2015 MRI head. FINDINGS: Brain: Previously identified enhancing metastasis within the cerebellum and the supratentorial brain are either resolved or markedly diminished in comparison with the prior MRI. There is residual punctate enhancement within the right inferior cerebellar metastasis (series 11, image 25) and within the right posterior superior cerebellar metastasis only appreciated on the sagittal postcontrast sequence (series 13, image 16). No new focus of abnormal enhancement in the brain is identified. Vasogenic edema associated with several metastasis on the prior study is no longer appreciated. A few residual nonenhancing small foci of T2  FLAIR hyperintensity are compatible with minimal chronic microvascular ischemic changes. No diffusion signal abnormality. No evidence for intracranial hemorrhage. Vascular: Normal flow voids. Skull and upper cervical spine: Normal marrow signal. Sinuses/Orbits: Midline cystic lesion centered in the adenoid tonsils with thick rim enhancement and septations measuring up to 19 mm (series 11 image 27) is stable. Other: Interval resection of a right posterior temporal scalp lesion without residual abnormal enhancement. IMPRESSION: 1. Previously identified enhancing metastasis within cerebellum and supratentorial brain are either resolved or markedly diminished in comparison with the prior MRI. No new metastasis identified. 2. Interval resolution of vasogenic edema associated with metastasis. 3. No evidence for acute/early subacute infarction or intracranial hemorrhage. 4. Into for section of right posterior temporal scalp lesion. No appreciable residual neoplasm. 5. Adenoid tonsil cystic lesion is stable, possibly Thornwaldt cyst or nasopharyngeal mucous retention cyst. This can be further assessed with direct visualization. Electronically Signed   By: Kristine Garbe M.D.   On: 12/26/2015 14:54       IMPRESSION/PLAN: 1. Stage IV NSCLC, adenocarcinoma of the right lung with brain metastases. We reviewed the findings from the patient's MRI, and reviewed that he appears to be radiographically stable. We will plan to see him back in 3 month's time for follow up with repeat imaging. He will continue with Taxotere and Ramucirumab under the care of Dr. Daria Pastures, PAC.      Carola Rhine, PAC

## 2015-12-30 NOTE — Addendum Note (Signed)
Encounter addended by: Malena Edman, RN on: 12/30/2015 10:10 AM<BR>    Actions taken: Charge Capture section accepted

## 2016-01-06 ENCOUNTER — Encounter (HOSPITAL_COMMUNITY)
Admission: RE | Admit: 2016-01-06 | Discharge: 2016-01-06 | Disposition: A | Discharge status: Home / Self Care | Payer: BLUE CROSS/BLUE SHIELD | Source: Ambulatory Visit | Attending: Hematology & Oncology | Admitting: Hematology & Oncology

## 2016-01-06 DIAGNOSIS — C7951 Secondary malignant neoplasm of bone: Secondary | ICD-10-CM | POA: Insufficient documentation

## 2016-01-06 DIAGNOSIS — C3491 Malignant neoplasm of unspecified part of right bronchus or lung: Secondary | ICD-10-CM | POA: Diagnosis present

## 2016-01-06 LAB — GLUCOSE, CAPILLARY: Glucose-Capillary: 127 mg/dL — ABNORMAL HIGH (ref 65–99)

## 2016-01-06 MED ORDER — FLUDEOXYGLUCOSE F - 18 (FDG) INJECTION
11.4600 | Freq: Once | INTRAVENOUS | Status: AC | PRN
  Administered 2016-01-06: 11.46 via INTRAVENOUS

## 2016-01-09 ENCOUNTER — Encounter (HOSPITAL_COMMUNITY): Payer: BLUE CROSS/BLUE SHIELD | Attending: Hematology & Oncology

## 2016-01-09 ENCOUNTER — Encounter (HOSPITAL_BASED_OUTPATIENT_CLINIC_OR_DEPARTMENT_OTHER): Payer: BLUE CROSS/BLUE SHIELD | Admitting: Oncology

## 2016-01-09 ENCOUNTER — Encounter (HOSPITAL_COMMUNITY): Payer: Self-pay | Admitting: Oncology

## 2016-01-09 VITALS — BP 126/79 | HR 94 | Temp 97.7°F | Resp 18 | Wt 229.2 lb

## 2016-01-09 DIAGNOSIS — C7951 Secondary malignant neoplasm of bone: Secondary | ICD-10-CM | POA: Diagnosis not present

## 2016-01-09 DIAGNOSIS — K219 Gastro-esophageal reflux disease without esophagitis: Secondary | ICD-10-CM

## 2016-01-09 DIAGNOSIS — C3491 Malignant neoplasm of unspecified part of right bronchus or lung: Secondary | ICD-10-CM | POA: Diagnosis present

## 2016-01-09 DIAGNOSIS — Z5112 Encounter for antineoplastic immunotherapy: Secondary | ICD-10-CM | POA: Diagnosis not present

## 2016-01-09 DIAGNOSIS — Z5111 Encounter for antineoplastic chemotherapy: Secondary | ICD-10-CM

## 2016-01-09 DIAGNOSIS — C7931 Secondary malignant neoplasm of brain: Secondary | ICD-10-CM | POA: Diagnosis not present

## 2016-01-09 LAB — URINALYSIS, MICROSCOPIC (REFLEX)

## 2016-01-09 LAB — COMPREHENSIVE METABOLIC PANEL
ALBUMIN: 3.4 g/dL — AB (ref 3.5–5.0)
ALK PHOS: 58 U/L (ref 38–126)
ALT: 15 U/L — ABNORMAL LOW (ref 17–63)
ANION GAP: 9 (ref 5–15)
AST: 19 U/L (ref 15–41)
BUN: 15 mg/dL (ref 6–20)
CO2: 24 mmol/L (ref 22–32)
Calcium: 9.2 mg/dL (ref 8.9–10.3)
Chloride: 104 mmol/L (ref 101–111)
Creatinine, Ser: 0.82 mg/dL (ref 0.61–1.24)
GFR calc Af Amer: >60 mL/min (ref >60)
GFR calc non Af Amer: >60 mL/min (ref >60)
GLUCOSE: 165 mg/dL — AB (ref 65–99)
POTASSIUM: 3.7 mmol/L (ref 3.5–5.1)
SODIUM: 137 mmol/L (ref 135–145)
Total Bilirubin: 0.6 mg/dL (ref 0.3–1.2)
Total Protein: 7.2 g/dL (ref 6.5–8.1)

## 2016-01-09 LAB — URINALYSIS, ROUTINE W REFLEX MICROSCOPIC
Bilirubin Urine: NEGATIVE
Glucose, UA: 250 mg/dL — AB
Hgb urine dipstick: NEGATIVE
LEUKOCYTES UA: NEGATIVE
NITRITE: NEGATIVE
PH: 6 (ref 5.0–8.0)
Protein, ur: 30 mg/dL — AB
SPECIFIC GRAVITY, URINE: 1.025 (ref 1.005–1.030)

## 2016-01-09 LAB — CBC WITH DIFFERENTIAL/PLATELET
BASOS ABS: 0 10*3/uL (ref 0.0–0.1)
BASOS PCT: 0 %
EOS ABS: 0 10*3/uL (ref 0.0–0.7)
Eosinophils Relative: 0 %
HCT: 42.2 % (ref 39.0–52.0)
HEMOGLOBIN: 13.9 g/dL (ref 13.0–17.0)
Lymphocytes Relative: 8 %
Lymphs Abs: 1.8 10*3/uL (ref 0.7–4.0)
MCH: 32.6 pg (ref 26.0–34.0)
MCHC: 32.9 g/dL (ref 30.0–36.0)
MCV: 99.1 fL (ref 78.0–100.0)
MONOS PCT: 3 %
Monocytes Absolute: 0.7 10*3/uL (ref 0.1–1.0)
NEUTROS PCT: 89 %
Neutro Abs: 19.5 10*3/uL — ABNORMAL HIGH (ref 1.7–7.7)
Platelets: 395 10*3/uL (ref 150–400)
RBC: 4.26 MIL/uL (ref 4.22–5.81)
RDW: 17.8 % — AB (ref 11.5–15.5)
WBC: 21.9 10*3/uL — ABNORMAL HIGH (ref 4.0–10.5)

## 2016-01-09 MED ORDER — SODIUM CHLORIDE 0.9 % IV SOLN
Freq: Once | INTRAVENOUS | Status: AC
  Administered 2016-01-09: 11:00:00 via INTRAVENOUS

## 2016-01-09 MED ORDER — SODIUM CHLORIDE 0.9% FLUSH: 10.0000 mL | INTRAVENOUS | Status: DC | PRN

## 2016-01-09 MED ORDER — DEXAMETHASONE SODIUM PHOSPHATE 10 MG/ML IJ SOLN
10.0000 mg | Freq: Once | INTRAMUSCULAR | Status: AC
  Administered 2016-01-09: 10 mg via INTRAVENOUS

## 2016-01-09 MED ORDER — SODIUM CHLORIDE 0.9 % IV SOLN
10.0000 mg/kg | Freq: Once | INTRAVENOUS | Status: AC
  Administered 2016-01-09: 1100 mg via INTRAVENOUS
  Filled 2016-01-09: qty 100

## 2016-01-09 MED ORDER — OMEPRAZOLE 20 MG PO CPDR: 20.0000 mg | DELAYED_RELEASE_CAPSULE | Freq: Every day | ORAL | 2 refills | Status: DC

## 2016-01-09 MED ORDER — ACETAMINOPHEN 325 MG PO TABS
650.0000 mg | ORAL_TABLET | Freq: Once | ORAL | Status: AC
  Administered 2016-01-09: 650 mg via ORAL

## 2016-01-09 MED ORDER — DOCETAXEL CHEMO INJECTION 160 MG/16ML
75.0000 mg/m2 | Freq: Once | INTRAVENOUS | Status: AC
  Administered 2016-01-09: 170 mg via INTRAVENOUS
  Filled 2016-01-09: qty 17

## 2016-01-09 MED ORDER — DIPHENHYDRAMINE HCL 50 MG/ML IJ SOLN
50.0000 mg | Freq: Once | INTRAMUSCULAR | Status: AC
  Administered 2016-01-09: 50 mg via INTRAVENOUS

## 2016-01-09 MED ORDER — HEPARIN SOD (PORK) LOCK FLUSH 100 UNIT/ML IV SOLN
INTRAVENOUS | Status: AC
  Filled 2016-01-09: qty 5

## 2016-01-09 MED ORDER — HEPARIN SOD (PORK) LOCK FLUSH 100 UNIT/ML IV SOLN
500.0000 [IU] | Freq: Once | INTRAVENOUS | Status: AC | PRN
  Administered 2016-01-09: 500 [IU]

## 2016-01-09 MED ORDER — DIPHENHYDRAMINE HCL 50 MG/ML IJ SOLN
INTRAMUSCULAR | Status: AC
  Filled 2016-01-09: qty 1

## 2016-01-09 MED ORDER — DEXAMETHASONE SODIUM PHOSPHATE 10 MG/ML IJ SOLN
INTRAMUSCULAR | Status: AC
  Filled 2016-01-09: qty 1

## 2016-01-09 MED ORDER — ACETAMINOPHEN 325 MG PO TABS
ORAL_TABLET | ORAL | Status: AC
  Filled 2016-01-09: qty 2

## 2016-01-09 NOTE — Progress Notes (Signed)
Patient tolerated infusion well.  VSS.  Patient ambulatory and stable upon discharge.

## 2016-01-09 NOTE — Patient Instructions (Signed)
[place] at [facility] Discharge Instructions  RECOMMENDATIONS MADE BY THE CONSULTANT AND ANY TEST RESULTS WILL BE SENT TO YOUR REFERRING PHYSICIAN.  You were seen today by Kirby Crigler PA-C. Use dial soap for bathing. Apply neosporin to boils. Use warm compresses on boils. Rx given for prilosec, nexium samples given to start now. Return in 3 weeks for treatment and follow up.  Thank you for choosing [place] at [facility] to provide your oncology and hematology care.  To afford each patient quality time with our provider, please arrive at least 15 minutes before your scheduled appointment time.   Beginning January 23rd 2017 lab work for the [facility] will be done in the  Main lab at [facility] on 1st floor. If you have a lab appointment with the [facility] please come in thru the  Main Entrance and check in at the main information desk  You need to re-schedule your appointment should you arrive 10 or more minutes late.  We strive to give you quality time with our providers, and arriving late affects you and other patients whose appointments are after yours.  Also, if you no show three or more times for appointments you may be dismissed from the clinic at the providers discretion.     Again, thank you for choosing [facility].  Our hope is that these requests will decrease the amount of time that you wait before being seen by our physicians.       _____________________________________________________________  Should you have questions after your visit to [facility], please contact our office at [phone] between the hours of 8:30 a.m. and 4:30 p.m.  Voicemails left after 4:30 p.m. will not be returned until the following business day.  For prescription refill requests, have your pharmacy contact our office.         Resources For Cancer Patients and their Caregivers ? American Cancer Society: Can  assist with transportation, wigs, general needs, runs Look Good Feel Better.        385-221-2912 ? Cancer Care: Provides financial assistance, online support groups, medication/co-pay assistance.  1-800-813-HOPE (437) 158-2510) ? [place] Assists [place] cancer patients and their families through emotional , educational and financial support.  319-336-5882 ? [place] DSS Where to apply for food stamps, Medicaid and utility assistance. 548 049 6931 ? [facility]: Transportation to medical appointments. 902-240-4348 ? Social Security Administration: May apply for disability if have a Stage IV cancer. 602-496-1289 249 862 5282 ? [employer], Disability and Transit Services: Assists with nutrition, care and transit needs. [place] Support Programs: '@10RELATIVEDAYS'$ @ > Cancer Support Group  2nd Tuesday of the month 1pm-2pm, Journey Room  > Creative Journey  3rd Tuesday of the month 1130am-1pm, Journey Room  > Look Good Feel Better  1st Wednesday of the month 10am-12 noon, Journey Room (Call [facility] to register [phone])

## 2016-01-09 NOTE — Assessment & Plan Note (Signed)
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  On Xgeva monthly to reduce risk for SRE.  Xgeva due next week.

## 2016-01-09 NOTE — Assessment & Plan Note (Addendum)
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  Treated by Dr. Tressie Stalker at [facility] with Carboplatin/Pemetrexed every 21 days x 6 cycles (12/10/2014- 03/25/2015) with transition to maintenance Pemetrexed every 21 days with last dose being administered on 08/01/2015. Imaging demonstrated progression of disease, resulting in a change in therapy to [place] beginning on 08/29/2015- 10/24/2015.  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 Lisbeth Renshaw. PET scan on 10/31/2015 demonstrated progression of disease resulting in a change in therapy to Docetaxel and Ramucirumab beginning on 11/07/2015.  Oncology history is updated.  Pre-treatment labs today: CBC diff, CMET.  I personally reviewed and went over laboratory results with the patient.  The results are noted within this dictation.  Labs satisfy treatment parameters.  Leukocytosis and neutrophilia are noted secondary to Neulasta.  Will hold Neulasta this cycle.  I personally reviewed and went over radiographic studies with the patient.  The results are noted within this dictation.  PET scan on 01/06/2016 demonstrates a positive response to therapy.  We will need to continue to follow TSH given his previous Opdivo treatment.  TSH is ordered for next treatment cycle.  UA with reflex is ordered for his complaint of hematuria.  He notes increased belching.  I will give him a Nexium sample so he can start PPI today.  Rx is escribed for Prilosec 20 mg daily.  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  It is improved today.  He notes change in taste.  3 lbs weight loss is noted.  I will ask Burtis Junes, RD to make contact with the patient regarding this issue.  He reports a 2-3 days worth of "diarrhea" but more watery stool than anything else.  He had 2-3 BM per day during that time.  I have recommended DIAL soap for bathing, warm compresses to boils, and use of Neosporin  (or similar product) if boil opens.  Return in 3 weeks for follow-up and next cycle of chemotherapy.

## 2016-01-09 NOTE — Progress Notes (Signed)
Carl Burly, MD [place] [address]  Adenocarcinoma of right lung [facility]) - Plan: TSH  Bone metastasis ([place])  Gastroesophageal reflux disease, esophagitis presence not specified  CURRENT THERAPY: Docetaxel + Ramucirumab beginning on 11/07/2015  INTERVAL HISTORY: Carl Simmons 55 y.o. 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 Treated by Dr. Tressie Stalker at [facility] with Carboplatin/Pemetrexed every 21 days x 6 cycles (12/10/2014- 03/25/2015) with transition to maintenance Pemetrexed every 21 days with last dose being administered on 08/01/2015. Imaging demonstrated progression of disease, resulting in a change in therapy to Carl Simmons beginning on 08/29/2015- 10/24/2015.  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 Lisbeth Renshaw. PET scan on 10/31/2015 demonstrated progression of disease resulting in a change in therapy to Docetaxel and Ramucirumab beginning on 11/07/2015.    Adenocarcinoma of right lung ([place])   11/20/2014 Procedure    Right scalp skin biopsy      11/20/2014 Pathology Results    Adenocarcinoma of lung primary. PDL-1 NEGATIVE (0% tumor proportion score); EGFR mutation NEGATIVE; no evidence of ALK gene rearrangement detected by FISH; no reaarnagement of ROS1 gene detected.      11/29/2014 PET scan    Large hypermetabolic mass within the RLL with central necrosis. Hypermetabolic right suprahilar mass with postobstructive collapse of the RUL. Subcarinal, paratracheal, prevascular and R internal mammary nodal metastases. Small R adrenal gland nodule.      12/10/2014 - 03/25/2015 Chemotherapy    Carboplatin and pemetrexed 6 cycles      02/11/2015 PET scan    Partial metabolic response to therapy, with decrease in hypermetabolic right lung masses, postobstructive right upper lobe consolidation, thoracic lymphadenopathy and right adrenal metastasis. No new  or progressive disease identified.      04/05/2015 PET scan    No significant change in hypermetabolic right lung masses and thoracic lymphadenopathy. No new or progressive disease identified.      04/15/2015 - 08/01/2015 Chemotherapy    Pemetrexed maintenance      08/14/2015 PET scan    Overall progression of metastatic lung cancer. Increasingly hypermetabolic mediastinal lymph nodes and R lung masses, enlarging R adrenal mass and new osseous lesions. Enlarging hypermetabolic R-sided scalp nodules.      08/14/2015 Progression    Progression of disease on PET imaging      08/29/2015 - 10/24/2015 Chemotherapy    Opdivo      08/29/2015 Miscellaneous    Xgeva started monthly      09/05/2015 Imaging    MRI brain- Multiple metastatic deposits in the brain. At least 9 lesions are identified. Mild edema around the 10 mm lesion in the right cerebellum.      09/27/2015 - 09/27/2015 Radiation Therapy    Dr. Lisbeth Renshaw- SRS to brain mets:  PTV target 1-10 were treated using 2 treatment plans. A single isocenter technique was utilized to treat 8 lesion simultaneously. PTV's 2 and 5 were then treated simultaneously with a second radiation treatment plan due to their close proximity. A prescription dose of 20 Gy was delivered to each lesion. ExacTrac Snap verification was performed for each couch angle.       10/31/2015 PET scan    1. Overall there has been interval progression of disease compared with the previous exam. 2. There is an increased FDG uptake associated with the dominant mass involving the right lower lobe. Additionally,  there are new hypermetabolic pre-vascular and right posterior cervical lymph nodes. 3. Significant interval increase in FDG uptake associated with right upper lobe pulmonary nodule and right adrenal gland metastases. 4. New hypermetabolic metastasis is involving the left hip. Additionally, there is intense uptake associated with fractured right rib. Underlying lesion not  excluded. 5. Increase in FDG uptake associated with lesion involving the posterior nasopharynx.      10/31/2015 Progression    Progression of disease      11/07/2015 Treatment Plan Change    D/C Opdivo and change to Docetaxel + Ramucirumab      11/07/2015 -  Chemotherapy    Docetaxel + Ramucirumab       01/06/2016 PET scan    1. Overall positive response to therapy with reduction in size and metabolic activity of metastatic lesions. 2. Decreased in the peripheral metabolic activity of the large RIGHT lower lobe mass. 3. Decrease in metabolic activity of metastatic mediastinal lymph nodes. 4. Decrease in metabolic activity of RIGHT adrenal gland metastasis. 5. Decreased metabolic activity skeletal metastasis       He tolerated treatment reasonably well.  He reports a few side effects associated with treatment: 1. Grade 1 peripheral neuropathy, of fingertips only.  Not interfering with ADLs.  Improving with time. 2. Change in taste.  He reports foods smell great, but meats are not palatable.  Education is provided. 3. "Diarrhea."  He reports 2-3 watery stools x 2-3 days post-treatment.  He did not use antidiarrheal medication and it resolved spontaneously.  BMs are back to baseline.  He also reports 2 episodes of hematuria.    He notes increased belching.    He reports 2 boils on his buttocks that are resolving.  He notes a history of these in the past.  He denies any complaints associated with these except discomfort.  One of the boils "busted."  Review of Systems  Constitutional: Positive for weight loss. Negative for chills and fever.  HENT: Negative.   Eyes: Negative.  Negative for double vision.  Respiratory: Negative.  Negative for cough, sputum production and shortness of breath.   Cardiovascular: Negative.  Negative for chest pain.  Gastrointestinal: Positive for diarrhea (watery stools ) and heartburn. Negative for constipation, nausea and vomiting.    Genitourinary: Positive for hematuria. Negative for dysuria, frequency and urgency.  Musculoskeletal: Negative.   Skin: Negative.   Neurological: Negative.  Negative for weakness and headaches.  Endo/Heme/Allergies: Negative.   Psychiatric/Behavioral: Negative.     Past Medical History:  Diagnosis Date  . Adenocarcinoma of right lung ([place]) 07/11/2015  . Bone metastasis ([place]) 08/24/2015  . Cirrhosis of liver ([place]) 08/14/2015   PET on 08/14/2015 demonstrates cirrhotic liver.     Past Surgical History:  Procedure Laterality Date  . right power port placement Right    right subclavian    Family History  Problem Relation Age of Onset  . Cancer Father     thyroid cancer  . Lupus Sister   . Cancer Sister     metastatic ovarian    Social History   Social History  . Marital status: Married    Spouse name: N/A  . Number of children: N/A  . Years of education: N/A   Social History Main Topics  . Smoking status: Current Every Day Smoker    Packs/day: 2.00  . Smokeless tobacco: Never Used  . Alcohol use No  . Drug use: No  . Sexual activity: Not Asked   Other Topics Concern  .  None   Social History Narrative  . None     PHYSICAL EXAMINATION  ECOG PERFORMANCE STATUS: 1 - Symptomatic but completely ambulatory  There were no vitals filed for this visit.  Vitals - 1 value per visit 24/26/8341  SYSTOLIC 962  DIASTOLIC 92  Pulse 229  Temperature   Respirations 20  Weight (lb) 229.2    GENERAL:alert, no distress, well nourished, well developed, comfortable, cooperative, smiling and accompanied by wife, in chemo-bed SKIN: skin color, texture, turgor are normal, no rashes or significant lesions HEAD: Normocephalic, No masses, lesions, tenderness or abnormalities.  EYES: normal, EOMI, Conjunctiva are pink and non-injected EARS: External ears normal OROPHARYNX:lips, buccal mucosa, and tongue normal and mucous membranes are moist  NECK: supple, trachea midline LYMPH:   no palpable lymphadenopathy BREAST:not examined LUNGS: clear to auscultation  With decreased breath sounds bilaterally. HEART: regular rate & rhythm ABDOMEN:abdomen soft, obese and normal bowel sounds BACK: Back symmetric, no curvature. EXTREMITIES:less then 2 second capillary refill, no joint deformities, effusion, or inflammation, no skin discoloration, no cyanosis, no edema. NEURO: alert & oriented x 3 with fluent speech, no focal motor/sensory deficits, gait normal   LABORATORY DATA: CBC    Component Value Date/Time   WBC 21.9 (H) 01/09/2016 0930   RBC 4.26 01/09/2016 0930   HGB 13.9 01/09/2016 0930   HCT 42.2 01/09/2016 0930   PLT 395 01/09/2016 0930   MCV 99.1 01/09/2016 0930   MCH 32.6 01/09/2016 0930   MCHC 32.9 01/09/2016 0930   RDW 17.8 (H) 01/09/2016 0930   LYMPHSABS 1.8 01/09/2016 0930   MONOABS 0.7 01/09/2016 0930   EOSABS 0.0 01/09/2016 0930   BASOSABS 0.0 01/09/2016 0930      Chemistry      Component Value Date/Time   NA 137 01/09/2016 0930   K 3.7 01/09/2016 0930   CL 104 01/09/2016 0930   CO2 24 01/09/2016 0930   BUN 15 01/09/2016 0930   CREATININE 0.82 01/09/2016 0930      Component Value Date/Time   CALCIUM 9.2 01/09/2016 0930   ALKPHOS 58 01/09/2016 0930   AST 19 01/09/2016 0930   ALT 15 (L) 01/09/2016 0930   BILITOT 0.6 01/09/2016 0930        PENDING LABS:   RADIOGRAPHIC STUDIES:  Mr [name] NL Contrast  Result Date: 12/26/2015 CLINICAL DATA:  55 y/o  M; lung cancer.  SRS restaging. EXAM: MRI HEAD WITHOUT AND WITH CONTRAST TECHNIQUE: Multiplanar, multiecho pulse sequences of the brain and surrounding structures were obtained without and with intravenous contrast. CONTRAST:  20 cc MultiHance. COMPARISON:  09/16/2015 MRI head. FINDINGS: Brain: Previously identified enhancing metastasis within the cerebellum and the supratentorial brain are either resolved or markedly diminished in comparison with the prior MRI. There is residual punctate  enhancement within the right inferior cerebellar metastasis (series 11, image 25) and within the right posterior superior cerebellar metastasis only appreciated on the sagittal postcontrast sequence (series 13, image 16). No new focus of abnormal enhancement in the brain is identified. Vasogenic edema associated with several metastasis on the prior study is no longer appreciated. A few residual nonenhancing small foci of T2 FLAIR hyperintensity are compatible with minimal chronic microvascular ischemic changes. No diffusion signal abnormality. No evidence for intracranial hemorrhage. Vascular: Normal flow voids. Skull and upper cervical spine: Normal marrow signal. Sinuses/Orbits: Midline cystic lesion centered in the adenoid tonsils with thick rim enhancement and septations measuring up to 19 mm (series 11 image 27) is stable. Other: Interval  resection of a right posterior temporal scalp lesion without residual abnormal enhancement. IMPRESSION: 1. Previously identified enhancing metastasis within cerebellum and supratentorial brain are either resolved or markedly diminished in comparison with the prior MRI. No new metastasis identified. 2. Interval resolution of vasogenic edema associated with metastasis. 3. No evidence for acute/early subacute infarction or intracranial hemorrhage. 4. Into for section of right posterior temporal scalp lesion. No appreciable residual neoplasm. 5. Adenoid tonsil cystic lesion is stable, possibly Thornwaldt cyst or nasopharyngeal mucous retention cyst. This can be further assessed with direct visualization. Electronically Signed   By: Kristine Garbe M.D.   On: 12/26/2015 14:54   Nm Pet Image Restag (ps) Skull Base To Thigh  Result Date: 01/06/2016 CLINICAL DATA:  Subsequent treatment strategy for non-small cell lung cancer. EXAM: NUCLEAR MEDICINE PET SKULL BASE TO THIGH TECHNIQUE: 11.5 mCi F-18 FDG was injected intravenously. Full-ring PET imaging was performed from  the skull base to thigh after the radiotracer. CT data was obtained and used for attenuation correction and anatomic localization. FASTING BLOOD GLUCOSE:  Value: 127 mg/dl COMPARISON:  PET-CT 10/31/2015 FINDINGS: NECK No hypermetabolic lymph nodes in the neck. CHEST Reduction in metabolic activity of mediastinal lymph nodes. Lymph nodes remain intensely hypermetabolic but decreased. For example prevascular lymph node on image 58 of fused data set with SUV max equal 7.3 decreased from 15.6. RIGHT lower paratracheal lymph node with SUV max equal 11.4 decreased from 15.0. No new mediastinal adenopathy. The dominant mass in the RIGHT lower lobe measures 4.8 cm. There is metabolic activity along the medial circumference of the lesion with SUV max equal 9.2. This is decreased from the circumferential activity with SUV max equal 21. Hypermetabolic RIGHT lower lobe nodule measuring 10 mm on image 78, series 4 is similar. ABDOMEN/PELVIS There is decreased metabolic activity associated with the RIGHT adrenal gland which is decrease in size additionally. SUV max equal 5.6 decreased from SUV max equal 23. No new hypermetabolic activity in the abdomen pelvis. Hypermetabolic periportal lymph nodes again noted. SKELETON Decreased metabolic activity within the LEFT femoral neck with SUV max equal 5.7 decreased from 16.7. Persistent hypermetabolic activity associated with a RIGHT rib fracture laterally (image 73 of fused series). IMPRESSION: 1. Overall positive response to therapy with reduction in size and metabolic activity of metastatic lesions. 2. Decreased in the peripheral metabolic activity of the large RIGHT lower lobe mass. 3. Decrease in metabolic activity of metastatic mediastinal lymph nodes. 4. Decrease in metabolic activity of RIGHT adrenal gland metastasis. 5. Decreased metabolic activity skeletal metastasis Electronically Signed   By: Suzy Bouchard M.D.   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  Treated by Dr. Tressie Stalker at [facility] with Carboplatin/Pemetrexed every 21 days x 6 cycles (12/10/2014- 03/25/2015) with transition to maintenance Pemetrexed every 21 days with last dose being administered on 08/01/2015. Imaging demonstrated progression of disease, resulting in a change in therapy to [place] beginning on 08/29/2015- 10/24/2015.  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 Lisbeth Renshaw. PET scan on 10/31/2015 demonstrated progression of disease resulting in a change in therapy to Docetaxel and Ramucirumab beginning on 11/07/2015.  Oncology history is updated.  Pre-treatment labs today: CBC diff, CMET.  I personally reviewed and went over laboratory results with the patient.  The results are noted within this dictation.  Labs satisfy treatment parameters.  Leukocytosis and neutrophilia are noted secondary to Neulasta.  Will hold Neulasta this cycle.  I personally reviewed and went over radiographic studies with the patient.  The results are noted within this dictation.  PET scan on 01/06/2016 demonstrates a positive response to therapy.  We will need to continue to follow TSH given his previous Opdivo treatment.  TSH is ordered for next treatment cycle.  UA with reflex is ordered for his complaint of hematuria.  He notes increased belching.  I will give him a Nexium sample so he can start PPI today.  Rx is escribed for Prilosec 20 mg daily.  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  It is improved today.  He notes change in taste.  3 lbs weight loss is noted.  I will ask Burtis Junes, RD to make contact with the patient regarding this issue.  He reports a 2-3 days worth of "diarrhea" but more watery stool than anything else.  He had 2-3 BM per day during that time.  I have recommended DIAL soap for bathing,  warm compresses to boils, and use of Neosporin (or similar product) if boil opens.  Return in 3 weeks for follow-up and next cycle of chemotherapy.  Bone metastasis ([place]) Bone metastases from NSCLC.  On Xgeva monthly to reduce risk for SRE.  Xgeva due next week.   ORDERS PLACED FOR THIS ENCOUNTER: Orders Placed This Encounter  Procedures  . TSH    MEDICATIONS PRESCRIBED THIS ENCOUNTER: Meds ordered this encounter  Medications  . omeprazole (PRILOSEC) 20 MG capsule    Sig: Take 1 capsule (20 mg total) by mouth daily.    Dispense:  30 capsule    Refill:  2    Order Specific Question:   Supervising Provider    Answer:   Patrici Ranks [id number]  . DISCONTD: omega-3 acid ethyl esters (LOVAZA) 1 g capsule    Refill:  0    THERAPY PLAN:  Continue treatment as outlined above: Docetaxel + Ramucirumab  All questions were answered. The patient knows to call the clinic with any problems, questions or concerns. We can certainly see the patient much sooner if necessary.  Patient and plan discussed with Dr. Ancil Linsey and she is in agreement with the aforementioned.   This note is electronically signed by: Doy Mince 01/09/2016 12:09 PM

## 2016-01-09 NOTE — Patient Instructions (Signed)
[facility] Discharge Instructions for Patients Receiving Chemotherapy   Beginning January 23rd 2017 lab work for the [facility] will be done in the  Main lab at [facility] on 1st floor. If you have a lab appointment with the [facility] please come in thru the  Main Entrance and check in at the main information desk   Today you received the following chemotherapy agents: Cyramza and Taxotere.    If you develop nausea and vomiting, or diarrhea that is not controlled by your medication, call the clinic.  The clinic phone number is [phone]. Office hours are Monday-Friday 8:30am-5:00pm.  BELOW ARE SYMPTOMS THAT SHOULD BE REPORTED IMMEDIATELY:  *FEVER GREATER THAN 101.0 F  *CHILLS WITH OR WITHOUT FEVER  NAUSEA AND VOMITING THAT IS NOT CONTROLLED WITH YOUR NAUSEA MEDICATION  *UNUSUAL SHORTNESS OF BREATH  *UNUSUAL BRUISING OR BLEEDING  TENDERNESS IN MOUTH AND THROAT WITH OR WITHOUT PRESENCE OF ULCERS  *URINARY PROBLEMS  *BOWEL PROBLEMS  UNUSUAL RASH Items with * indicate a potential emergency and should be followed up as soon as possible. If you have an emergency after office hours please contact your primary care physician or go to the nearest emergency department.  Please call the clinic during office hours if you have any questions or concerns.   You may also contact the Patient Navigator at [phone] should you have any questions or need assistance in obtaining follow up care.      Resources For Cancer Patients and their Caregivers ? American Cancer Society: Can assist with transportation, wigs, general needs, runs Look Good Feel Better.        (250)465-1549 ? Cancer Care: Provides financial assistance, online support groups, medication/co-pay assistance.  1-800-813-HOPE 3084547319) ? [place] Assists [place] cancer patients and their families through emotional , educational and financial support.   779-491-7866 ? [place] DSS Where to apply for food stamps, Medicaid and utility assistance. (619)726-8735 ? [facility]: Transportation to medical appointments. (252) 381-9733 ? Social Security Administration: May apply for disability if have a Stage IV cancer. (252) 117-8242 (250)487-4437 ? [employer], Disability and Transit Services: Assists with nutrition, care and transit needs. [phone]

## 2016-01-15 ENCOUNTER — Emergency Department (HOSPITAL_COMMUNITY): Payer: BLUE CROSS/BLUE SHIELD

## 2016-01-15 ENCOUNTER — Other Ambulatory Visit: Payer: Self-pay

## 2016-01-15 ENCOUNTER — Encounter (HOSPITAL_COMMUNITY): Payer: Self-pay | Admitting: Emergency Medicine

## 2016-01-15 ENCOUNTER — Inpatient Hospital Stay (HOSPITAL_COMMUNITY)
Admission: EM | Admit: 2016-01-15 | Discharge: 2016-01-19 | DRG: 392 | Disposition: A | Discharge status: Home / Self Care | Payer: BLUE CROSS/BLUE SHIELD | Attending: Family Medicine | Admitting: Family Medicine

## 2016-01-15 ENCOUNTER — Telehealth (HOSPITAL_COMMUNITY): Payer: Self-pay | Admitting: Emergency Medicine

## 2016-01-15 DIAGNOSIS — Z79899 Other long term (current) drug therapy: Secondary | ICD-10-CM

## 2016-01-15 DIAGNOSIS — C3491 Malignant neoplasm of unspecified part of right bronchus or lung: Secondary | ICD-10-CM | POA: Diagnosis present

## 2016-01-15 DIAGNOSIS — Z8249 Family history of ischemic heart disease and other diseases of the circulatory system: Secondary | ICD-10-CM

## 2016-01-15 DIAGNOSIS — Z832 Family history of diseases of the blood and blood-forming organs and certain disorders involving the immune mechanism: Secondary | ICD-10-CM

## 2016-01-15 DIAGNOSIS — K529 Noninfective gastroenteritis and colitis, unspecified: Principal | ICD-10-CM | POA: Diagnosis present

## 2016-01-15 DIAGNOSIS — Z823 Family history of stroke: Secondary | ICD-10-CM

## 2016-01-15 DIAGNOSIS — K625 Hemorrhage of anus and rectum: Secondary | ICD-10-CM

## 2016-01-15 DIAGNOSIS — R740 Nonspecific elevation of levels of transaminase and lactic acid dehydrogenase [LDH]: Secondary | ICD-10-CM | POA: Diagnosis present

## 2016-01-15 DIAGNOSIS — D72819 Decreased white blood cell count, unspecified: Secondary | ICD-10-CM

## 2016-01-15 DIAGNOSIS — L03317 Cellulitis of buttock: Secondary | ICD-10-CM

## 2016-01-15 DIAGNOSIS — Z6835 Body mass index (BMI) 35.0-35.9, adult: Secondary | ICD-10-CM | POA: Diagnosis not present

## 2016-01-15 DIAGNOSIS — Z808 Family history of malignant neoplasm of other organs or systems: Secondary | ICD-10-CM

## 2016-01-15 DIAGNOSIS — T451X5A Adverse effect of antineoplastic and immunosuppressive drugs, initial encounter: Secondary | ICD-10-CM | POA: Diagnosis present

## 2016-01-15 DIAGNOSIS — E871 Hypo-osmolality and hyponatremia: Secondary | ICD-10-CM | POA: Diagnosis present

## 2016-01-15 DIAGNOSIS — C7951 Secondary malignant neoplasm of bone: Secondary | ICD-10-CM | POA: Diagnosis present

## 2016-01-15 DIAGNOSIS — R7989 Other specified abnormal findings of blood chemistry: Secondary | ICD-10-CM | POA: Diagnosis present

## 2016-01-15 DIAGNOSIS — D701 Agranulocytosis secondary to cancer chemotherapy: Secondary | ICD-10-CM | POA: Diagnosis present

## 2016-01-15 DIAGNOSIS — C7800 Secondary malignant neoplasm of unspecified lung: Secondary | ICD-10-CM | POA: Diagnosis present

## 2016-01-15 DIAGNOSIS — B957 Other staphylococcus as the cause of diseases classified elsewhere: Secondary | ICD-10-CM | POA: Diagnosis present

## 2016-01-15 DIAGNOSIS — K7460 Unspecified cirrhosis of liver: Secondary | ICD-10-CM | POA: Diagnosis present

## 2016-01-15 DIAGNOSIS — E440 Moderate protein-calorie malnutrition: Secondary | ICD-10-CM | POA: Diagnosis present

## 2016-01-15 DIAGNOSIS — Z7189 Other specified counseling: Secondary | ICD-10-CM | POA: Diagnosis not present

## 2016-01-15 DIAGNOSIS — R739 Hyperglycemia, unspecified: Secondary | ICD-10-CM | POA: Diagnosis present

## 2016-01-15 DIAGNOSIS — F17210 Nicotine dependence, cigarettes, uncomplicated: Secondary | ICD-10-CM | POA: Diagnosis present

## 2016-01-15 DIAGNOSIS — Z66 Do not resuscitate: Secondary | ICD-10-CM | POA: Diagnosis present

## 2016-01-15 DIAGNOSIS — B964 Proteus (mirabilis) (morganii) as the cause of diseases classified elsewhere: Secondary | ICD-10-CM | POA: Diagnosis present

## 2016-01-15 DIAGNOSIS — Z515 Encounter for palliative care: Secondary | ICD-10-CM | POA: Diagnosis not present

## 2016-01-15 DIAGNOSIS — C7931 Secondary malignant neoplasm of brain: Secondary | ICD-10-CM | POA: Diagnosis present

## 2016-01-15 DIAGNOSIS — Z833 Family history of diabetes mellitus: Secondary | ICD-10-CM | POA: Diagnosis not present

## 2016-01-15 DIAGNOSIS — D709 Neutropenia, unspecified: Secondary | ICD-10-CM | POA: Diagnosis present

## 2016-01-15 DIAGNOSIS — L0231 Cutaneous abscess of buttock: Secondary | ICD-10-CM | POA: Diagnosis present

## 2016-01-15 DIAGNOSIS — C3431 Malignant neoplasm of lower lobe, right bronchus or lung: Secondary | ICD-10-CM | POA: Diagnosis present

## 2016-01-15 DIAGNOSIS — Z7952 Long term (current) use of systemic steroids: Secondary | ICD-10-CM

## 2016-01-15 DIAGNOSIS — Z820 Family history of epilepsy and other diseases of the nervous system: Secondary | ICD-10-CM

## 2016-01-15 DIAGNOSIS — K922 Gastrointestinal hemorrhage, unspecified: Secondary | ICD-10-CM

## 2016-01-15 HISTORY — DX: Noninfective gastroenteritis and colitis, unspecified: K52.9

## 2016-01-15 LAB — CBC WITH DIFFERENTIAL/PLATELET
BASOS ABS: 0.1 10*3/uL (ref 0.0–0.1)
BASOS PCT: 7 %
EOS ABS: 0.1 10*3/uL (ref 0.0–0.7)
EOS PCT: 6 %
HCT: 40.7 % (ref 39.0–52.0)
Hemoglobin: 13.6 g/dL (ref 13.0–17.0)
Lymphocytes Relative: 65 %
Lymphs Abs: 1.1 10*3/uL (ref 0.7–4.0)
MCH: 33.3 pg (ref 26.0–34.0)
MCHC: 33.4 g/dL (ref 30.0–36.0)
MCV: 99.5 fL (ref 78.0–100.0)
MONO ABS: 0 10*3/uL — AB (ref 0.1–1.0)
Monocytes Relative: 2 %
Neutro Abs: 0.3 10*3/uL — ABNORMAL LOW (ref 1.7–7.7)
Neutrophils Relative %: 20 %
PLATELETS: 219 10*3/uL (ref 150–400)
RBC: 4.09 MIL/uL — ABNORMAL LOW (ref 4.22–5.81)
RDW: 17 % — AB (ref 11.5–15.5)
WBC: 1.6 10*3/uL — AB (ref 4.0–10.5)

## 2016-01-15 LAB — PHOSPHORUS: PHOSPHORUS: 1.5 mg/dL — AB (ref 2.5–4.6)

## 2016-01-15 LAB — HEPATIC FUNCTION PANEL
ALT: 21 U/L (ref 17–63)
AST: 26 U/L (ref 15–41)
Albumin: 3.1 g/dL — ABNORMAL LOW (ref 3.5–5.0)
Alkaline Phosphatase: 49 U/L (ref 38–126)
BILIRUBIN DIRECT: 0.2 mg/dL (ref 0.1–0.5)
BILIRUBIN TOTAL: 0.9 mg/dL (ref 0.3–1.2)
Indirect Bilirubin: 0.7 mg/dL (ref 0.3–0.9)
Total Protein: 5.6 g/dL — ABNORMAL LOW (ref 6.5–8.1)

## 2016-01-15 LAB — LACTIC ACID, PLASMA
LACTIC ACID, VENOUS: 2.7 mmol/L — AB (ref 0.5–1.9)
LACTIC ACID, VENOUS: 1 mmol/L (ref 0.5–1.9)
Lactic Acid, Venous: 1.5 mmol/L (ref 0.5–1.9)

## 2016-01-15 LAB — BASIC METABOLIC PANEL
Anion gap: 9 (ref 5–15)
BUN: 19 mg/dL (ref 6–20)
CALCIUM: 8.4 mg/dL — AB (ref 8.9–10.3)
CO2: 26 mmol/L (ref 22–32)
Chloride: 101 mmol/L (ref 101–111)
Creatinine, Ser: 0.7 mg/dL (ref 0.61–1.24)
GLUCOSE: 145 mg/dL — AB (ref 65–99)
Potassium: 3.4 mmol/L — ABNORMAL LOW (ref 3.5–5.1)
SODIUM: 136 mmol/L (ref 135–145)

## 2016-01-15 LAB — URINALYSIS, ROUTINE W REFLEX MICROSCOPIC
GLUCOSE, UA: 100 mg/dL — AB
Leukocytes, UA: NEGATIVE
Nitrite: POSITIVE — AB
PH: 6.5 (ref 5.0–8.0)
Protein, ur: 30 mg/dL — AB
Specific Gravity, Urine: 1.015 (ref 1.005–1.030)

## 2016-01-15 LAB — URINALYSIS, MICROSCOPIC (REFLEX): RBC / HPF: NONE SEEN RBC/hpf (ref 0–5)

## 2016-01-15 LAB — LIPASE, BLOOD: LIPASE: 23 U/L (ref 11–51)

## 2016-01-15 LAB — PROCALCITONIN

## 2016-01-15 LAB — PROTIME-INR
INR: 1.04
PROTHROMBIN TIME: 13.7 s (ref 11.4–15.2)

## 2016-01-15 LAB — SAMPLE TO BLOOD BANK

## 2016-01-15 LAB — POC OCCULT BLOOD, ED: FECAL OCCULT BLD: POSITIVE — AB

## 2016-01-15 LAB — MAGNESIUM: Magnesium: 1.5 mg/dL — ABNORMAL LOW (ref 1.7–2.4)

## 2016-01-15 LAB — TROPONIN I

## 2016-01-15 LAB — APTT: aPTT: 30 seconds (ref 24–36)

## 2016-01-15 MED ORDER — VANCOMYCIN HCL IN DEXTROSE 1-5 GM/200ML-% IV SOLN: 1000.0000 mg | Freq: Three times a day (TID) | INTRAVENOUS | Status: DC

## 2016-01-15 MED ORDER — HYDROCODONE-ACETAMINOPHEN 5-325 MG PO TABS
1.0000 | ORAL_TABLET | ORAL | Status: DC | PRN
  Administered 2016-01-18: 1 via ORAL
  Filled 2016-01-15: qty 1

## 2016-01-15 MED ORDER — CYANOCOBALAMIN 1000 MCG/ML IJ SOLN: 1000.0000 ug | INTRAMUSCULAR | Status: DC

## 2016-01-15 MED ORDER — ACETAMINOPHEN 325 MG PO TABS
650.0000 mg | ORAL_TABLET | Freq: Four times a day (QID) | ORAL | Status: DC | PRN
Start: 2016-01-15 — End: 2016-01-19

## 2016-01-15 MED ORDER — POTASSIUM CHLORIDE CRYS ER 20 MEQ PO TBCR
20.0000 meq | EXTENDED_RELEASE_TABLET | Freq: Two times a day (BID) | ORAL | Status: DC
  Administered 2016-01-15: 20 meq via ORAL
  Filled 2016-01-15 (×2): qty 1

## 2016-01-15 MED ORDER — ONDANSETRON HCL 4 MG PO TABS: 4.0000 mg | ORAL_TABLET | Freq: Four times a day (QID) | ORAL | Status: DC | PRN

## 2016-01-15 MED ORDER — ACETAMINOPHEN 650 MG RE SUPP: 650.0000 mg | Freq: Four times a day (QID) | RECTAL | Status: DC | PRN

## 2016-01-15 MED ORDER — SODIUM CHLORIDE 0.9 % IV SOLN: INTRAVENOUS | Status: DC

## 2016-01-15 MED ORDER — FOLIC ACID 1 MG PO TABS
1.0000 mg | ORAL_TABLET | Freq: Every day | ORAL | Status: DC
  Administered 2016-01-16 – 2016-01-19 (×4): 1 mg via ORAL
  Filled 2016-01-15 (×4): qty 1

## 2016-01-15 MED ORDER — PIPERACILLIN-TAZOBACTAM 3.375 G IVPB
3.3750 g | Freq: Three times a day (TID) | INTRAVENOUS | Status: DC
  Administered 2016-01-15 – 2016-01-18 (×8): 3.375 g via INTRAVENOUS
  Filled 2016-01-15 (×7): qty 50

## 2016-01-15 MED ORDER — PANTOPRAZOLE SODIUM 40 MG PO TBEC
40.0000 mg | DELAYED_RELEASE_TABLET | Freq: Every day | ORAL | Status: DC
  Administered 2016-01-16 – 2016-01-19 (×4): 40 mg via ORAL
  Filled 2016-01-15 (×4): qty 1

## 2016-01-15 MED ORDER — PIPERACILLIN-TAZOBACTAM 3.375 G IVPB 30 MIN
3.3750 g | Freq: Once | INTRAVENOUS | Status: AC
  Administered 2016-01-15: 3.375 g via INTRAVENOUS
  Filled 2016-01-15: qty 50

## 2016-01-15 MED ORDER — PIPERACILLIN-TAZOBACTAM 3.375 G IVPB
INTRAVENOUS | Status: AC
  Filled 2016-01-15: qty 50

## 2016-01-15 MED ORDER — IOPAMIDOL (ISOVUE-300) INJECTION 61%
100.0000 mL | Freq: Once | INTRAVENOUS | Status: AC | PRN
  Administered 2016-01-15: 100 mL via INTRAVENOUS

## 2016-01-15 MED ORDER — ENOXAPARIN SODIUM 40 MG/0.4ML ~~LOC~~ SOLN
40.0000 mg | SUBCUTANEOUS | Status: DC
  Administered 2016-01-15: 40 mg via SUBCUTANEOUS
  Filled 2016-01-15: qty 0.4

## 2016-01-15 MED ORDER — SODIUM CHLORIDE 0.9 % IV BOLUS (SEPSIS)
1000.0000 mL | Freq: Once | INTRAVENOUS | Status: AC
  Administered 2016-01-15: 1000 mL via INTRAVENOUS

## 2016-01-15 MED ORDER — LACTATED RINGERS IV SOLN
INTRAVENOUS | Status: DC
  Administered 2016-01-15: 23:00:00 via INTRAVENOUS

## 2016-01-15 MED ORDER — NICOTINE 21 MG/24HR TD PT24
21.0000 mg | MEDICATED_PATCH | Freq: Every day | TRANSDERMAL | Status: DC
  Administered 2016-01-15 – 2016-01-19 (×5): 21 mg via TRANSDERMAL
  Filled 2016-01-15 (×5): qty 1

## 2016-01-15 MED ORDER — VANCOMYCIN HCL IN DEXTROSE 1-5 GM/200ML-% IV SOLN
1000.0000 mg | Freq: Once | INTRAVENOUS | Status: AC
Start: 2016-01-15 — End: 2016-01-15
  Administered 2016-01-15: 1000 mg via INTRAVENOUS
  Filled 2016-01-15: qty 200

## 2016-01-15 MED ORDER — MORPHINE SULFATE (PF) 4 MG/ML IV SOLN: 4.0000 mg | INTRAVENOUS | Status: DC | PRN

## 2016-01-15 MED ORDER — ONDANSETRON HCL 4 MG/2ML IJ SOLN: 4.0000 mg | Freq: Four times a day (QID) | INTRAMUSCULAR | Status: DC | PRN

## 2016-01-15 MED ORDER — SODIUM CHLORIDE 0.9 % IV SOLN
1000.0000 mL | INTRAVENOUS | Status: DC
  Administered 2016-01-15: 1000 mL via INTRAVENOUS

## 2016-01-15 NOTE — ED Notes (Signed)
Pt drinking contrast. 

## 2016-01-15 NOTE — ED Notes (Signed)
Pt taken to CT.

## 2016-01-15 NOTE — ED Notes (Signed)
CRITICAL VALUE ALERT  Critical value received:  Lactic 2.8  Date of notification:  01/15/16  Time of notification:  4734  Critical value read back:Yes.    Nurse who received alert:  Charmayne Sheer RN  MD notified (1st page):  Thurnell Garbe

## 2016-01-15 NOTE — Progress Notes (Addendum)
Pharmacy Antibiotic Note  Carl Simmons is a 55 y.o. male admitted on 01/15/2016 with sepsis, cellulitis.  Pharmacy has been consulted for [place] dosing.  Pt is obese with h/o cancer.  Vancomycin was discontinued but will now be restarted  Plan:  Vancomycin '1000mg'$  IV q12h (per obesity nomogram) Check trough when appropriate Cont Zosyn 3.375gm IV q8h, EID F/u renal function, cultures and clinical course   Height: '5\' 8"'$  (172.7 cm) Weight: 235 lb (106.6 kg) IBW/kg (Calculated) : 68.4  Temp (24hrs), Avg:98 F (36.7 C), Min:98 F (36.7 C), Max:98 F (36.7 C)   Recent Labs Lab 01/09/16 0930 01/15/16 1255 01/15/16 1333  WBC 21.9* 1.6*  --   CREATININE 0.82 0.70  --   LATICACIDVEN  --   --  2.7*    Estimated Creatinine Clearance: 120.6 mL/min (by C-G formula based on SCr of 0.7 mg/dL).    No Known Allergies  Antimicrobials this admission: Vancomycin 12/20 >>12/20. 12/21>> Zosyn 12/20 >>   Dose adjustments this admission:  Microbiology results: 12/20 BCx: pending  12/20 UCx: pending   12/20 wound:    MRSA PCR:   Thank you for allowing pharmacy to be a part of this patient's care.  Excell Seltzer, PharmD Clinical Pharmacist 01/15/2016 2:47 PM

## 2016-01-15 NOTE — ED Provider Notes (Signed)
[facility] DEPT Provider Note   CSN: [id number] Arrival date & time: 01/15/16  1215     History   Chief Complaint Chief Complaint  Patient presents with  . Rectal Bleeding    HPI Carl Simmons is a 55 y.o. male.     Pt was seen at 1305. Per pt, c/o gradual onset and persistence of multiple intermittent episodes of rectal bleeding since yesterday. Pt states he knows "a boil opened up on my right buttocks" which is also bleeding. Pt states his stools are alternating "blood" and "brown." Has been associated with diarrhea and mild abd "pain." LD chemo 6 days ago. Denies N/V, no CP/SOB, no back pain, no fevers.   Past Medical History:  Diagnosis Date  . 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 Bone metastasis ([place]) 08/24/2015  . Cirrhosis of liver ([place]) 08/14/2015   PET on 08/14/2015 demonstrates cirrhotic liver.     Patient Active Problem List   Diagnosis Date Noted  . Brain metastases ([place]) 09/18/2015  . Bone metastasis ([place]) 08/24/2015  . Cirrhosis of liver ([place]) 08/14/2015  . 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 COPD (chronic obstructive pulmonary disease) ([place]) 05/21/2015  . Morbid obesity due to excess calories ([place]) 05/21/2015  . Chemotherapy management, encounter for 02/12/2015  . Malignant neoplasm of lower lobe of right lung ([place]) 12/03/2014  . History of broken leg 11/19/2014  . Right lower lobe lung mass 11/19/2014  . Smoking greater than 40 pack years 11/19/2014  . Weight loss, unintentional 11/19/2014    Past Surgical History:  Procedure Laterality Date  . right power port placement Right    right subclavian       Home Medications    Prior to Admission medications   Medication Sig Start Date End Date Taking? Authorizing Provider  cyanocobalamin (,VITAMIN B-12,) 1000 MCG/ML injection Inject 1,000 mcg into the muscle every 30 (thirty) days.   Yes Historical Provider, MD  dexamethasone (DECADRON) 4 MG tablet Take 2 tablets (8  mg total) by mouth 2 (two) times daily. Start the day before Taxotere. Then daily after chemo for 2 days. 11/07/15  Yes Patrici Ranks, MD  folic acid (FOLVITE) 1 MG tablet Take 1 mg by mouth daily.  04/15/15  Yes Historical Provider, MD  HYDROcodone-acetaminophen (NORCO/VICODIN) 5-325 MG tablet Take 1 tablet by mouth every 4 (four) hours as needed.   Yes Historical Provider, MD  naproxen (NAPROSYN) 500 MG tablet Take 500 mg by mouth 2 (two) times daily as needed.   Yes Historical Provider, MD  Omega-3 Fatty Acids (FISH OIL) 1000 MG CAPS TAKE 1 CAPSULE BY MOUTH EVERY DAY 11/19/15  Yes Patrici Ranks, MD  omeprazole (PRILOSEC) 20 MG capsule Take 1 capsule (20 mg total) by mouth daily. 01/09/16  Yes Manon Hilding Kefalas, PA-C  potassium chloride SA (K-DUR,KLOR-CON) 20 MEQ tablet Take 1 tablet (20 mEq total) by mouth 2 (two) times daily. 09/13/15  Yes Manon Hilding Kefalas, PA-C  predniSONE (DELTASONE) 20 MG tablet Take at the onset of diarrhea.  Take 5 tablets ('100mg'$ ) at one time.  Then call the [facility] 08/23/15  Yes Baird Cancer, PA-C    Family History Family History  Problem Relation Age of Onset  . Cancer Father     thyroid cancer  . Lupus Sister   . Cancer Sister     metastatic ovarian    Social History Social History  Substance Use Topics  . Smoking status: Current Every Day  Smoker    Packs/day: 2.00  . Smokeless tobacco: Never Used  . Alcohol use No     Allergies   Patient has no known allergies.   Review of Systems Review of Systems ROS: Statement: All systems negative except as marked or noted in the HPI; Constitutional: Negative for fever and chills. ; ; Eyes: Negative for eye pain, redness and discharge. ; ; ENMT: Negative for ear pain, hoarseness, nasal congestion, sinus pressure and sore throat. ; ; Cardiovascular: Negative for chest pain, palpitations, diaphoresis, dyspnea and peripheral edema. ; ; Respiratory: Negative for cough, wheezing and stridor. ; ;  Gastrointestinal: Negative for nausea, vomiting, hematemesis, jaundice and +abd pain, diarrhea, rectal bleeding. . ; ; Genitourinary: Negative for dysuria, flank pain and hematuria. ; ; Musculoskeletal: Negative for back pain and neck pain. Negative for swelling and trauma.; ; Skin: +abscess on right buttock.  Negative for pruritus, abrasions, blisters, bruising and skin lesion.; ; Neuro: Negative for headache, lightheadedness and neck stiffness. Negative for weakness, altered level of consciousness, altered mental status, extremity weakness, paresthesias, involuntary movement, seizure and syncope.      Physical Exam Updated Vital Signs BP 94/86 (BP Location: Left Arm)   Pulse 86   Temp 98 F (36.7 C) (Oral)   Resp 19   Ht '5\' 8"'$  (1.727 m)   Wt 235 lb (106.6 kg)   SpO2 93%   BMI 35.73 kg/m    Patient Vitals for the past 24 hrs:  BP Temp Temp src Pulse Resp SpO2 Height Weight  01/15/16 1456 - 97.8 F (36.6 C) Oral - 20 - - -  01/15/16 1453 109/83 - - 96 - 100 % - -  01/15/16 1430 100/66 - - 96 - 98 % - -  01/15/16 1412 101/74 - - - - - - -  01/15/16 1322 - - - - 19 - - -  01/15/16 1310 - - - - 19 - - -  01/15/16 1224 - 98 F (36.7 C) Oral - - - - -  01/15/16 1222 - - - - - - '5\' 8"'$  (1.727 m) 235 lb (106.6 kg)  01/15/16 1221 94/86 - - 86 18 93 % - -     Physical Exam 1310: Physical examination:  Nursing notes reviewed; Vital signs and O2 SAT reviewed;  Constitutional: Well developed, Well nourished, Well hydrated, In no acute distress; Head:  Normocephalic, atraumatic; Eyes: EOMI, PERRL, No scleral icterus; ENMT: Mouth and pharynx normal, Mucous membranes moist; Neck: Supple, Full range of motion, No lymphadenopathy; Cardiovascular: Regular rate and rhythm, No gallop; Respiratory: Breath sounds clear & equal bilaterally, No wheezes.  Speaking full sentences with ease, Normal respiratory effort/excursion; Chest: Nontender, Movement normal; Abdomen: Soft, +mild LLQ tenderness to palp. No  rebound or guarding. Nondistended, Normal bowel sounds. Rectal exam performed w/permission of pt and ED RN chaperone present.  Anal tone normal.  Non-tender, small amount of soft brown-maroon stool in rectal vault, heme positive.  No fissures, no external hemorrhoids, no palp masses.  With ED chaperone present:  Approx 5x5cm right buttock open abscess spontaneously draining purulent and sanguinous fluid, +surrounding erythema.; Genitourinary: No CVA tenderness; Extremities: Pulses normal, No tenderness, No edema, No calf edema or asymmetry.; Neuro: AA&Ox3, Major CN grossly intact.  Speech clear. No gross focal motor or sensory deficits in extremities.; Skin: Color normal, Warm, Dry.   ED Treatments / Results  Labs (all labs ordered are listed, but only abnormal results are displayed)   EKG  EKG Interpretation  None        Radiology   Procedures Procedures (including critical care time)  Medications Ordered in ED Medications - No data to display   Initial Impression / Assessment and Plan / ED Course  I have reviewed the triage vital signs and the nursing notes.  Pertinent labs & imaging results that were available during my care of the patient were reviewed by me and considered in my medical decision making (see chart for details).  MDM Reviewed: previous chart, nursing note and vitals Reviewed previous: labs Interpretation: labs, ECG and CT scan Total time providing critical care: 30-74 minutes. This excludes time spent performing separately reportable procedures and services. Consults: admitting MD   CRITICAL CARE Performed by: Alfonzo Feller Total critical care time: 35 minutes Critical care time was exclusive of separately billable procedures and treating other patients. Critical care was necessary to treat or prevent imminent or life-threatening deterioration. Critical care was time spent personally by me on the following activities: development of treatment plan  with patient and/or surrogate as well as nursing, discussions with consultants, evaluation of patient's response to treatment, examination of patient, obtaining history from patient or surrogate, ordering and performing treatments and interventions, ordering and review of laboratory studies, ordering and review of radiographic studies, pulse oximetry and re-evaluation of patient's condition.  ED ECG REPORT   Date: 01/15/2016  Rate: 102  Rhythm: sinus tachycardia  QRS Axis: normal  Intervals: normal  ST/T Wave abnormalities: normal  Conduction Disutrbances:none  Narrative Interpretation: baseline wander  Old EKG Reviewed: none available   Results for orders placed or performed during the hospital encounter of 01/15/16  CBC with Differential  Result Value Ref Range   WBC 1.6 (L) 4.0 - 10.5 K/uL   RBC 4.09 (L) 4.22 - 5.81 MIL/uL   Hemoglobin 13.6 13.0 - 17.0 g/dL   HCT 40.7 39.0 - 52.0 %   MCV 99.5 78.0 - 100.0 fL   MCH 33.3 26.0 - 34.0 pg   MCHC 33.4 30.0 - 36.0 g/dL   RDW 17.0 (H) 11.5 - 15.5 %   Platelets 219 150 - 400 K/uL   Neutrophils Relative % 20 %   Neutro Abs 0.3 (L) 1.7 - 7.7 K/uL   Lymphocytes Relative 65 %   Lymphs Abs 1.1 0.7 - 4.0 K/uL   Monocytes Relative 2 %   Monocytes Absolute 0.0 (L) 0.1 - 1.0 K/uL   Eosinophils Relative 6 %   Eosinophils Absolute 0.1 0.0 - 0.7 K/uL   Basophils Relative 7 %   Basophils Absolute 0.1 0.0 - 0.1 K/uL  Basic metabolic panel  Result Value Ref Range   Sodium 136 135 - 145 mmol/L   Potassium 3.4 (L) 3.5 - 5.1 mmol/L   Chloride 101 101 - 111 mmol/L   CO2 26 22 - 32 mmol/L   Glucose, Bld 145 (H) 65 - 99 mg/dL   Simmons 19 6 - 20 mg/dL   Creatinine, Ser 0.70 0.61 - 1.24 mg/dL   Calcium 8.4 (L) 8.9 - 10.3 mg/dL   GFR calc non Af Amer >60 >60 mL/min   GFR calc Af Amer >60 >60 mL/min   Anion gap 9 5 - 15  Lipase, blood  Result Value Ref Range   Lipase 23 11 - 51 U/L  Lactic acid, plasma  Result Value Ref Range   Lactic Acid,  Venous 2.7 (HH) 0.5 - 1.9 mmol/L  Lactic acid, plasma  Result Value Ref Range   Lactic Acid, Venous 1.5  0.5 - 1.9 mmol/L  Urinalysis, Routine w reflex microscopic  Result Value Ref Range   Color, Urine AMBER (A) YELLOW   APPearance CLEAR CLEAR   Specific Gravity, Urine 1.015 1.005 - 1.030   pH 6.5 5.0 - 8.0   Glucose, UA 100 (A) NEGATIVE mg/dL   Hgb urine dipstick TRACE (A) NEGATIVE   Bilirubin Urine MODERATE (A) NEGATIVE   Ketones, ur TRACE (A) NEGATIVE mg/dL   Protein, ur 30 (A) NEGATIVE mg/dL   Nitrite POSITIVE (A) NEGATIVE   Leukocytes, UA NEGATIVE NEGATIVE  Hepatic function panel  Result Value Ref Range   Total Protein 5.6 (L) 6.5 - 8.1 g/dL   Albumin 3.1 (L) 3.5 - 5.0 g/dL   AST 26 15 - 41 U/L   ALT 21 17 - 63 U/L   Alkaline Phosphatase 49 38 - 126 U/L   Total Bilirubin 0.9 0.3 - 1.2 mg/dL   Bilirubin, Direct 0.2 0.1 - 0.5 mg/dL   Indirect Bilirubin 0.7 0.3 - 0.9 mg/dL  Troponin I  Result Value Ref Range   Troponin I <0.03 <0.03 ng/mL  Urinalysis, Microscopic (reflex)  Result Value Ref Range   RBC / HPF NONE SEEN 0 - 5 RBC/hpf   WBC, UA 0-5 0 - 5 WBC/hpf   Bacteria, UA FEW (A) NONE SEEN   Squamous Epithelial / LPF 0-5 (A) NONE SEEN  POC occult blood, ED  Result Value Ref Range   Fecal Occult Bld POSITIVE (A) NEGATIVE  Sample to Blood Bank  Result Value Ref Range   Blood Bank Specimen SAMPLE AVAILABLE FOR TESTING    Sample Expiration 01/18/2016     Ct Abdomen Pelvis W Contrast Result Date: 01/15/2016 CLINICAL DATA:  Rectal bleeding yesterday. EXAM: CT ABDOMEN AND PELVIS WITH CONTRAST TECHNIQUE: Multidetector CT imaging of the abdomen and pelvis was performed using the standard protocol following bolus administration of intravenous contrast. CONTRAST:  127m ISOVUE-300 IOPAMIDOL (ISOVUE-300) INJECTION 61% COMPARISON:  None. FINDINGS: Lower chest: 5.4 cm right lower lobe pulmonary mass. Hepatobiliary: Mildly diminutive size of the liver with a micronodular contour or  concerning for hepatocellular disease. No focal hepatic mass. No gallstones, gallbladder wall thickening, or biliary dilatation. Pancreas: Unremarkable. No pancreatic ductal dilatation or surrounding inflammatory changes. Spleen: Normal in size without focal abnormality. Adrenals/Urinary Tract: Adrenal glands are unremarkable. Kidneys are normal, without renal calculi, focal lesion, or hydronephrosis. Bladder is unremarkable. Stomach/Bowel: No pneumatosis, pneumoperitoneum or portal venous gas. Bowel wall thickening involving the sigmoid colon and to a lesser extent descending colon concerning for colitis. Normal appendix. No bowel dilatation to suggest bowel obstruction. Vascular/Lymphatic: Abdominal aortic atherosclerosis. No lymphadenopathy. Reproductive: Prostate is unremarkable. Other: No fluid collection or hematoma.  No abdominal wall hernia. Musculoskeletal: No lytic or sclerotic osseous lesion. Degenerative disc disease with disc height loss at L5-S1 with facet arthropathy. IMPRESSION: 1. Bowel wall thickening involving the sigmoid colon and to a lesser extent descending colon concerning for colitis. 2. 5.4 cm right lower lobe pulmonary mass most consistent with primary lung malignancy. 3.  Aortic Atherosclerosis (ICD10-170.0) Electronically Signed   By: HKathreen Devoid  On: 01/15/2016 17:11   Dg Chest Port 1 View Result Date: 01/15/2016 CLINICAL DATA:  Rectal bleeding. Shortness of breath. Right-sided lung cancer. EXAM: PORTABLE CHEST 1 VIEW COMPARISON:  PET of 9 days ago. Most recent chest radiograph of 12/07/2014. FINDINGS: A right-sided Port-A-Cath is looped in its low cervical course, similar. Terminates at the high SVC. Cardiomegaly accentuated by AP portable technique. No pleural  effusion or pneumothorax. No overt congestive failure. Right lower lobe lung mass, grossly similar. No lobar consolidation. IMPRESSION: Mild cardiomegaly, without congestive failure or explanation for shortness of breath.  Right lower lobe lung mass, as before. Electronically Signed   By: Abigail Miyamoto M.D.   On: 01/15/2016 15:13    1815:  Given pt's leukopenia and mildly elevated lactate, with visual abscess/cellulitis on right buttock; Code sepsis called. BP stable.  Judicious IVF given with improvement in 2nd lactate. IV abx given for cellulitis after BC and UC obtained. CT scan with colitis. Dx and testing d/w pt and family.  Questions answered.  Verb understanding, agreeable to admit. T/C to [facility]. Lorin Mercy, case discussed, including:  HPI, pertinent PM/SHx, VS/PE, dx testing, ED course and treatment:  Agreeable to admit, requests to write temporary orders, obtain medical bed to team APAdmits.    Final Clinical Impressions(s) / ED Diagnoses   Final diagnoses:  None    New Prescriptions New Prescriptions   No medications on file      Francine Graven, DO 01/16/16 2200

## 2016-01-15 NOTE — ED Notes (Signed)
edp in with pt to update

## 2016-01-15 NOTE — ED Triage Notes (Signed)
Pt states he started having rectal bleeding yesterday. Pt is currently on chemotherapy. Pt also c/o abscess to his R buttock. Pt not eating, poor appetiie.

## 2016-01-15 NOTE — H&P (Addendum)
History and Physical    Carl Simmons VEL:[id number] DOB: 1961-01-21 DOA: 01/15/2016  PCP: Carl Burly, MD Consultants:  Carl Simmons - onc Patient coming from: home - lives with wife; NOK: wife, [phone]  Chief Complaint: rectal bleeding  HPI: Carl Simmons is a 55 y.o. 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  Rectal bleeding started yesterday.  Called Carl Simmons from the [facility] and she said to bring him to the ER.  Doesn't like to lie flat, so currently uncomfortable.  No abdominal pain.  Bright red blood per rectum.  Has happened before and he didn't tell his wife.  No fevers.  He did have a boil that opened up on his right buttocks that is also bleeding.  Mild diarrhea, mild abdominal pain.  Cancer diagnosed 11/12/14.  Initially lung, stage IV terminal from the time they found it.  Has had chemo.  Told he would only have a few months at first.  Feels tired all the time.  Radiation right ribs, brain, left hip, right adrenal.  Did not receive Neulasta with his last cycle of Docetaxel and Ramucirumab.  Wife talked to someone who worked with hospice and was told they didn't need to get them involved yet.  Would be interested in talking with palliative care while here.  ED Course: Per Dr. Thurnell Simmons: 1815: Given pt's leukopenia and mildly elevated lactate, with visual abscess/cellulitis on right buttock; Code sepsis called. BP stable. Judicious IVF given with improvement in 2nd lactate. IV abx given for cellulitis after BC and UC obtained. CT scan with colitis. Dx and testing d/w pt and family. Questions answered. Verb understanding, agreeable to admit. T/C to Carl Simmons, case discussed, including: HPI, pertinent PM/SHx, VS/PE, dx testing, ED course and treatment: Agreeable to admit, requests to write temporary orders, obtain medical bed to team  APAdmits.   Review of Systems: As per HPI; otherwise 10 point review of systems reviewed and negative.   Ambulatory Status:  Ambulates without assistance but has to sit frequently because he "gives out"  Past Medical History:  Diagnosis Date  . Adenocarcinoma of right lung ([place]) 07/11/2015  . Bone metastasis ([place]) 08/24/2015  . Cirrhosis of liver ([place]) 08/14/2015   PET on 08/14/2015 demonstrates cirrhotic liver.     Past Surgical History:  Procedure Laterality Date  . right power port placement Right    right subclavian    Social History   Social History  . Marital status: Married    Spouse name: N/A  . Number of children: N/A  . Years of education: N/A   Occupational History  . disabled    Social History Main Topics  . Smoking status: Current Every Day Smoker    Packs/day: 2.00  . Smokeless tobacco: Never Used  . Alcohol use No  . Drug use: No  . Sexual activity: Not on file   Other Topics Concern  . Not on file   Social History Narrative  . No narrative on file    No Known Allergies  Family History  Problem Relation Age of Onset  . Alzheimer's disease Mother 8  . CAD Mother   . Diabetes Mother   . Cancer Father     thyroid cancer  . CVA Father 63  . Lupus Sister   . Cancer Sister     metastatic ovarian    Prior to Admission  medications   Medication Sig Start Date End Date Taking? Authorizing Provider  cyanocobalamin (,VITAMIN B-12,) 1000 MCG/ML injection Inject 1,000 mcg into the muscle every 30 (thirty) days.   Yes Historical Provider, MD  dexamethasone (DECADRON) 4 MG tablet Take 2 tablets (8 mg total) by mouth 2 (two) times daily. Start the day before Taxotere. Then daily after chemo for 2 days. 11/07/15  Yes Patrici Ranks, MD  folic acid (FOLVITE) 1 MG tablet Take 1 mg by mouth daily.  04/15/15  Yes Historical Provider, MD  HYDROcodone-acetaminophen (NORCO/VICODIN) 5-325 MG tablet Take 1 tablet by mouth every 4 (four) hours as needed.   Yes  Historical Provider, MD  naproxen (NAPROSYN) 500 MG tablet Take 500 mg by mouth 2 (two) times daily as needed.   Yes Historical Provider, MD  Omega-3 Fatty Acids (FISH OIL) 1000 MG CAPS TAKE 1 CAPSULE BY MOUTH EVERY DAY 11/19/15  Yes Patrici Ranks, MD  omeprazole (PRILOSEC) 20 MG capsule Take 1 capsule (20 mg total) by mouth daily. 01/09/16  Yes Manon Hilding Kefalas, PA-C  potassium chloride SA (K-DUR,KLOR-CON) 20 MEQ tablet Take 1 tablet (20 mEq total) by mouth 2 (two) times daily. 09/13/15  Yes Manon Hilding Kefalas, PA-C  predniSONE (DELTASONE) 20 MG tablet Take at the onset of diarrhea.  Take 5 tablets ('100mg'$ ) at one time.  Then call the [place] 08/23/15  Yes Baird Cancer, [place]    Physical Exam: Vitals:   01/15/16 1930 01/15/16 2000 01/15/16 2030 01/15/16 2100  BP: 112/80 112/84 122/84 120/87  Pulse:      Resp: '15 11 15 12  '$ Temp:      TempSrc:      SpO2:      Weight:      Height:         General:  Appears calm and comfortable and is NAD Eyes:  PERRL, EOMI, normal lids, iris ENT:  grossly normal hearing, lips & tongue, mmm Neck:  no LAD, masses or thyromegaly Cardiovascular:  RRR, no m/r/g. No LE edema.  Respiratory:  CTA bilaterally, no w/r/r. Normal respiratory effort. Abdomen:  soft, ntnd, NABS Skin:  no rash or induration seen on limited exam Musculoskeletal:  grossly normal tone BUE/BLE, good ROM, no bony abnormality Psychiatric:  grossly normal mood and affect, speech fluent and appropriate, AOx3 Neurologic:  CN 2-12 grossly intact, moves all extremities in coordinated fashion, sensation intact Rectal per Dr. Thurnell Simmons (not re-done): Rectal exam performed w/permission of pt and ED RN chaperone present. Anal tone normal. Non-tender, small amount of soft brown-maroon stool in rectal vault, heme positive. No fissures, no external hemorrhoids, no palp masses. With ED chaperone present: Approx 5x5cm right buttock open abscess spontaneously draining purulent and sanguinous fluid,  +surrounding erythema   Labs on Admission: I have personally reviewed following labs and imaging studies  CBC:  Recent Labs Lab 01/09/16 0930 01/15/16 1255  WBC 21.9* 1.6*  NEUTROABS 19.5* 0.3*  HGB 13.9 13.6  HCT 42.2 40.7  MCV 99.1 99.5  PLT 395 384   Basic Metabolic Panel:  Recent Labs Lab 01/09/16 0930 01/15/16 1255  NA 137 136  K 3.7 3.4*  CL 104 101  CO2 24 26  GLUCOSE 165* 145*  BUN 15 19  CREATININE 0.82 0.70  CALCIUM 9.2 8.4*   GFR: Estimated Creatinine Clearance: 120.6 mL/min (by C-G formula based on SCr of 0.7 mg/dL). Liver Function Tests:  Recent Labs Lab 01/09/16 0930 01/15/16 1305  AST 19 26  ALT  15* 21  ALKPHOS 58 49  BILITOT 0.6 0.9  PROT 7.2 5.6*  ALBUMIN 3.4* 3.1*    Recent Labs Lab 01/15/16 1305  LIPASE 23   No results for input(s): AMMONIA in the last 168 hours. Coagulation Profile: No results for input(s): INR, PROTIME in the last 168 hours. Cardiac Enzymes:  Recent Labs Lab 01/15/16 1305  TROPONINI <0.03   BNP (last 3 results) No results for input(s): PROBNP in the last 8760 hours. HbA1C: No results for input(s): HGBA1C in the last 72 hours. CBG: No results for input(s): GLUCAP in the last 168 hours. Lipid Profile: No results for input(s): CHOL, HDL, LDLCALC, TRIG, CHOLHDL, LDLDIRECT in the last 72 hours. Thyroid Function Tests: No results for input(s): TSH, T4TOTAL, FREET4, T3FREE, THYROIDAB in the last 72 hours. Anemia Panel: No results for input(s): VITAMINB12, FOLATE, FERRITIN, TIBC, IRON, RETICCTPCT in the last 72 hours. Urine analysis:    Component Value Date/Time   COLORURINE AMBER (A) 01/15/2016 1321   APPEARANCEUR CLEAR 01/15/2016 1321   LABSPEC 1.015 01/15/2016 1321   PHURINE 6.5 01/15/2016 1321   GLUCOSEU 100 (A) 01/15/2016 1321   HGBUR TRACE (A) 01/15/2016 1321   BILIRUBINUR MODERATE (A) 01/15/2016 1321   KETONESUR TRACE (A) 01/15/2016 1321   PROTEINUR 30 (A) 01/15/2016 1321   NITRITE POSITIVE  (A) 01/15/2016 1321   LEUKOCYTESUR NEGATIVE 01/15/2016 1321    Creatinine Clearance: Estimated Creatinine Clearance: 120.6 mL/min (by C-G formula based on SCr of 0.7 mg/dL).  Sepsis Labs: '@LABRCNTIP'$ (procalcitonin:4,lacticidven:4) ) Recent Results (from the past 240 hour(s))  Blood Culture (routine x 2)     Status: None (Preliminary result)   Collection Time: 01/15/16  2:42 PM  Result Value Ref Range Status   Specimen Description BLOOD RIGHT HAND  Final   Special Requests BOTTLES DRAWN AEROBIC AND ANAEROBIC 6CC  Final   Culture PENDING  Incomplete   Report Status PENDING  Incomplete  Blood Culture (routine x 2)     Status: None (Preliminary result)   Collection Time: 01/15/16  2:47 PM  Result Value Ref Range Status   Specimen Description LEFT ANTECUBITAL  Final   Special Requests BOTTLES DRAWN AEROBIC AND ANAEROBIC [facility]  Final   Culture PENDING  Incomplete   Report Status PENDING  Incomplete     Radiological Exams on Admission: Ct Abdomen Pelvis W Contrast  Result Date: 01/15/2016 CLINICAL DATA:  Rectal bleeding yesterday. EXAM: CT ABDOMEN AND PELVIS WITH CONTRAST TECHNIQUE: Multidetector CT imaging of the abdomen and pelvis was performed using the standard protocol following bolus administration of intravenous contrast. CONTRAST:  167m ISOVUE-300 IOPAMIDOL (ISOVUE-300) INJECTION 61% COMPARISON:  None. FINDINGS: Lower chest: 5.4 cm right lower lobe pulmonary mass. Hepatobiliary: Mildly diminutive size of the liver with a micronodular contour or concerning for hepatocellular disease. No focal hepatic mass. No gallstones, gallbladder wall thickening, or biliary dilatation. Pancreas: Unremarkable. No pancreatic ductal dilatation or surrounding inflammatory changes. Spleen: Normal in size without focal abnormality. Adrenals/Urinary Tract: Adrenal glands are unremarkable. Kidneys are normal, without renal calculi, focal lesion, or hydronephrosis. Bladder is unremarkable. Stomach/Bowel: No  pneumatosis, pneumoperitoneum or portal venous gas. Bowel wall thickening involving the sigmoid colon and to a lesser extent descending colon concerning for colitis. Normal appendix. No bowel dilatation to suggest bowel obstruction. Vascular/Lymphatic: Abdominal aortic atherosclerosis. No lymphadenopathy. Reproductive: Prostate is unremarkable. Other: No fluid collection or hematoma.  No abdominal wall hernia. Musculoskeletal: No lytic or sclerotic osseous lesion. Degenerative disc disease with disc height loss at L5-S1 with facet  arthropathy. IMPRESSION: 1. Bowel wall thickening involving the sigmoid colon and to a lesser extent descending colon concerning for colitis. 2. 5.4 cm right lower lobe pulmonary mass most consistent with primary lung malignancy. 3.  Aortic Atherosclerosis (ICD10-170.0) Electronically Signed   By: Kathreen Devoid   On: 01/15/2016 17:11   Dg Chest Port 1 View  Result Date: 01/15/2016 CLINICAL DATA:  Rectal bleeding. Shortness of breath. Right-sided lung cancer. EXAM: PORTABLE CHEST 1 VIEW COMPARISON:  PET of 9 days ago. Most recent chest radiograph of 12/07/2014. FINDINGS: A right-sided Port-A-Cath is looped in its low cervical course, similar. Terminates at the high SVC. Cardiomegaly accentuated by AP portable technique. No pleural effusion or pneumothorax. No overt congestive failure. Right lower lobe lung mass, grossly similar. No lobar consolidation. IMPRESSION: Mild cardiomegaly, without congestive failure or explanation for shortness of breath. Right lower lobe lung mass, as before. Electronically Signed   By: Abigail Miyamoto M.D.   On: 01/15/2016 15:13    EKG: Independently reviewed.  Sinus tachycardia with rate 102;  no evidence of acute ischemia  Assessment/Plan Principal Problem:   Colitis Active Problems:   Adenocarcinoma of right lung (HCC)   Smoking greater than 40 pack years   Neutropenia (HCC)   Elevated lactic acid level   Moderate malnutrition (HCC)    Hyperglycemia   Colitis -Patient presenting with rectal bleeding (heme positive) -Also has a buttock abscess, but this is apparently separate from the GI bleeding (according to Dr. Thurnell Simmons) -Received Vanc/Zosyn in the ER, but this was for cellulitis prior to return of CT scan showing colitis -Will continue Zosyn for intraabdominal infection -Patient is immunocompromised (see below) and so would consider adding back Vancomycin if he is not improving quickly -Initial Hgb 13.6; will recheck in AM but for now would treat as infection with bleeding rather than primary bleeding issue -Code sepsis was called and his lactate was elevated but his only SIRS criteria was leukopenia; will trend lactate, but it is already improved -Blood cultures x 2 are pending -Currently NPO for bowel rest  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 Whitney Simmons of admission  Malnutrition -Nutrition consult  Hyperglycemia -Glucose 145 -Will not address at this time, as this is unlikely to be a significant factor in his recovery at this time  Tobacco dependence -He is now smoking 2+ ppd -He understands that this is unlikely to change his prognosis and plans to continue to smoke indefintely -Nicotine patch while inpatient   DVT prophylaxis: Lovenox Code Status: DNR - confirmed with patient/family Family Communication: Wife present throughout evaluation  Disposition Plan: Home once clinically improved Consults called: Oncology, nutrition  Admission status: Admit - It is my clinical opinion that admission to INPATIENT is reasonable and necessary because this patient will require at least 2 midnights in  the hospital to treat this condition based on the medical complexity of the problems presented.  Given the aforementioned information, the predictability of an adverse outcome is felt to be significant.     Karmen Bongo MD [facility]  If 7PM-7AM, please contact night-coverage [email or web address] Password [facility]  01/15/2016, 10:19 PM

## 2016-01-16 ENCOUNTER — Ambulatory Visit (HOSPITAL_COMMUNITY): Payer: BLUE CROSS/BLUE SHIELD

## 2016-01-16 ENCOUNTER — Encounter (HOSPITAL_COMMUNITY): Payer: Self-pay | Admitting: Primary Care

## 2016-01-16 DIAGNOSIS — Z515 Encounter for palliative care: Secondary | ICD-10-CM

## 2016-01-16 DIAGNOSIS — Z7189 Other specified counseling: Secondary | ICD-10-CM

## 2016-01-16 DIAGNOSIS — C3491 Malignant neoplasm of unspecified part of right bronchus or lung: Secondary | ICD-10-CM

## 2016-01-16 LAB — BASIC METABOLIC PANEL
ANION GAP: 4 — AB (ref 5–15)
BUN: 11 mg/dL (ref 6–20)
CHLORIDE: 103 mmol/L (ref 101–111)
CO2: 26 mmol/L (ref 22–32)
Calcium: 7.6 mg/dL — ABNORMAL LOW (ref 8.9–10.3)
Creatinine, Ser: 0.59 mg/dL — ABNORMAL LOW (ref 0.61–1.24)
GFR calc Af Amer: >60 mL/min (ref >60)
GFR calc non Af Amer: >60 mL/min (ref >60)
GLUCOSE: 86 mg/dL (ref 65–99)
POTASSIUM: 3.6 mmol/L (ref 3.5–5.1)
Sodium: 133 mmol/L — ABNORMAL LOW (ref 135–145)

## 2016-01-16 LAB — CBC
HEMATOCRIT: 34.7 % — AB (ref 39.0–52.0)
HEMOGLOBIN: 11.7 g/dL — AB (ref 13.0–17.0)
MCH: 33.1 pg (ref 26.0–34.0)
MCHC: 33.7 g/dL (ref 30.0–36.0)
MCV: 98 fL (ref 78.0–100.0)
Platelets: 210 10*3/uL (ref 150–400)
RBC: 3.54 MIL/uL — ABNORMAL LOW (ref 4.22–5.81)
RDW: 17 % — AB (ref 11.5–15.5)
WBC: 1.5 10*3/uL — ABNORMAL LOW (ref 4.0–10.5)

## 2016-01-16 LAB — LACTIC ACID, PLASMA: Lactic Acid, Venous: 0.7 mmol/L (ref 0.5–1.9)

## 2016-01-16 MED ORDER — K PHOS MONO-SOD PHOS DI & MONO 155-852-130 MG PO TABS
500.0000 mg | ORAL_TABLET | Freq: Two times a day (BID) | ORAL | Status: DC
  Administered 2016-01-16 – 2016-01-19 (×6): 500 mg via ORAL
  Filled 2016-01-16 (×8): qty 2

## 2016-01-16 MED ORDER — VANCOMYCIN HCL IN DEXTROSE 1-5 GM/200ML-% IV SOLN
1000.0000 mg | Freq: Two times a day (BID) | INTRAVENOUS | Status: DC
  Administered 2016-01-16 – 2016-01-18 (×4): 1000 mg via INTRAVENOUS
  Filled 2016-01-16 (×3): qty 200

## 2016-01-16 MED ORDER — ENSURE ENLIVE PO LIQD
237.0000 mL | Freq: Two times a day (BID) | ORAL | Status: DC
  Administered 2016-01-16 – 2016-01-18 (×3): 237 mL via ORAL

## 2016-01-16 MED ORDER — TBO-FILGRASTIM 480 MCG/0.8ML ~~LOC~~ SOSY
480.0000 ug | PREFILLED_SYRINGE | Freq: Once | SUBCUTANEOUS | Status: DC
  Filled 2016-01-16: qty 0.8

## 2016-01-16 MED ORDER — POTASSIUM PHOSPHATE MONOBASIC 500 MG PO TABS: 500.0000 mg | ORAL_TABLET | Freq: Two times a day (BID) | ORAL | Status: DC

## 2016-01-16 MED ORDER — FILGRASTIM 480 MCG/1.6ML IJ SOLN
480.0000 ug | Freq: Once | INTRAMUSCULAR | Status: AC
  Administered 2016-01-16: 480 ug via SUBCUTANEOUS
  Filled 2016-01-16: qty 1.6

## 2016-01-16 MED ORDER — SODIUM CHLORIDE 0.9 % IV SOLN
INTRAVENOUS | Status: DC
  Administered 2016-01-16 – 2016-01-17 (×2): via INTRAVENOUS
  Administered 2016-01-17: 100 mL/h via INTRAVENOUS
  Administered 2016-01-18 – 2016-01-19 (×3): via INTRAVENOUS

## 2016-01-16 MED ORDER — MAGNESIUM SULFATE 2 GM/50ML IV SOLN
2.0000 g | Freq: Once | INTRAVENOUS | Status: AC
  Administered 2016-01-16: 2 g via INTRAVENOUS
  Filled 2016-01-16: qty 50

## 2016-01-16 NOTE — Consult Note (Signed)
Consultation Note Date: 01/16/2016   Patient Name: Carl Simmons  DOB: 05-May-1960  MRN: [id number]  Age / Sex: 55 y.o., male  PCP: [name], MD Referring Physician: Elmarie Shiley, MD  Reason for Consultation: Establishing goals of care  HPI/Patient Profile: 55 y.o. male  with past medical history of Adenocarcinoma of right lung with bony metastasis, cirrhosis of liver admitted on 01/15/2016 with colitis.   Clinical Assessment and Goals of Care: [name] is resting quietly in bed, he greets me making and keeping eye contact as I enter. Present today is his wife, Butch Penny, and his daughter and 2 grandchildren. He states several times that he wants to "escape". He talks about being tired, and unable to rest in the hospital. Family states that he sleeps up to 20 hours per day while at home, stating that he will sleep all day in this chair and then get up and say I'm tired I have to go to bed. [name] is tearful many times during our conversation, but he also states he has trouble with his yes watering.  He talks about how his white blood cells were "25" a week ago, and that now they are down to 1.5.  We talk briefly about labs and numbers, but [name] states that he is the kind of person that only wants to hear if something is "good or bad", not details. I share that this is not uncommon.  He's talks about his oncologist, Dr. Whitney Muse, several times stating that she had never seen such a "mean cancer".  I talk with [name] again about his desire to escape. He states that he will stay in the hospital as long as necessary. I share that he has a right to say what this time looks like. I share that he decides if he doesn't want to take more chemotherapy, or come back into the hospital, he has that right. His family is tearful. I will continue to talk with [name] about his choices, supporting him  through this difficult time  Healthcare power of attorney OTHER - not discussed in detail today, somewhat limited interview.    SUMMARY OF RECOMMENDATIONS   continue to treat the treatable at this point, but no extraordinary measures such as CPR intubation. At this point more [name]'s desire is to continue with all offered treatments for his cancer.  Code Status/Advance Care Planning:  DNR  Symptom Management:   per hospitalist  Palliative Prophylaxis:   Frequent Pain Assessment  Additional Recommendations (Limitations, Scope, Preferences):  Continue to treat the treatable at this point, but no extraordinary measures such as CPR or intubation. Continue with chemotherapy as it is offered.  Psycho-social/Spiritual:   Desire for further Chaplaincy support:no  Additional Recommendations: Caregiving  Support/Resources  Prognosis:   < 6 months, would not be surprising based on metastatic cancer burden, functional decline, sleeping 20 hours per day per family.  Discharge Planning: To be determined, likely home with the benefit of home health services  Primary Diagnoses: Present on Admission: . Colitis . Adenocarcinoma of right lung ([place]) . Smoking greater than 40 pack years . Neutropenia ([place]) . Elevated lactic acid level . Moderate malnutrition ([place]) . Hyperglycemia   I have reviewed the medical record, interviewed the patient and family, and examined the patient. The following aspects are pertinent.  Past Medical History:  Diagnosis Date  . Adenocarcinoma of right lung ([place]) 07/11/2015  . Bone metastasis ([place]) 08/24/2015  . Cirrhosis of liver ([place]) 08/14/2015   PET on 08/14/2015 demonstrates cirrhotic liver.    Social History   Social History  . Marital status: Married    Spouse name: N/A  . Number of children: N/A  . Years of education: N/A   Occupational History  . disabled    Social History Main Topics  . Smoking status: Current Every Day Smoker     Packs/day: 2.00  . Smokeless tobacco: Never Used  . Alcohol use No  . Drug use: No  . Sexual activity: Not Asked   Other Topics Concern  . None   Social History Narrative  . None   Family History  Problem Relation Age of Onset  . Alzheimer's disease Mother 32  . CAD Mother   . Diabetes Mother   . Cancer Father     thyroid cancer  . CVA Father 66  . Lupus Sister   . Cancer Sister     metastatic ovarian   Scheduled Meds: . [START ON 02/06/2016] cyanocobalamin  1,000 mcg Intramuscular Q30 days  . feeding supplement (ENSURE ENLIVE)  237 mL Oral BID BM  . folic acid  1 mg Oral Daily  . nicotine  21 mg Transdermal Daily  . pantoprazole  40 mg Oral Daily  . phosphorus  500 mg Oral BID WC  . piperacillin-tazobactam (ZOSYN)  IV  3.375 g Intravenous Q8H   Continuous Infusions: . lactated ringers 125 mL/hr at 01/15/16 2237   PRN Meds:.acetaminophen **OR** acetaminophen, HYDROcodone-acetaminophen, morphine injection, ondansetron **OR** ondansetron (ZOFRAN) IV Medications Prior to Admission:  Prior to Admission medications   Medication Sig Start Date End Date Taking? Authorizing Provider  cyanocobalamin (,VITAMIN B-12,) 1000 MCG/ML injection Inject 1,000 mcg into the muscle every 30 (thirty) days.   Yes Historical Provider, MD  dexamethasone (DECADRON) 4 MG tablet Take 2 tablets (8 mg total) by mouth 2 (two) times daily. Start the day before Taxotere. Then daily after chemo for 2 days. 11/07/15  Yes Patrici Ranks, MD  folic acid (FOLVITE) 1 MG tablet Take 1 mg by mouth daily.  04/15/15  Yes Historical Provider, MD  HYDROcodone-acetaminophen (NORCO/VICODIN) 5-325 MG tablet Take 1 tablet by mouth every 4 (four) hours as needed.   Yes Historical Provider, MD  naproxen (NAPROSYN) 500 MG tablet Take 500 mg by mouth 2 (two) times daily as needed.   Yes Historical Provider, MD  Omega-3 Fatty Acids (FISH OIL) 1000 MG CAPS TAKE 1 CAPSULE BY MOUTH EVERY DAY 11/19/15  Yes Patrici Ranks, MD  omeprazole (PRILOSEC) 20 MG capsule Take 1 capsule (20 mg total) by mouth daily. 01/09/16  Yes Manon Hilding Kefalas, PA-C  potassium chloride SA (K-DUR,KLOR-CON) 20 MEQ tablet Take 1 tablet (20 mEq total) by mouth 2 (two) times daily. 09/13/15  Yes Manon Hilding Kefalas, PA-C  predniSONE (DELTASONE) 20 MG tablet Take at the onset of diarrhea.  Take 5 tablets ('100mg'$ ) at one time.  Then call the [place] 08/23/15  Yes Baird Cancer, PA-C   No Known  Allergies Review of Systems  Unable to perform ROS: Other    Physical Exam  Constitutional: He is oriented to person, place, and time. No distress.  Obese, appears weak  HENT:  Head: Normocephalic.  Divot at right occipital area.  Cardiovascular: Normal rate and regular rhythm.   Pulmonary/Chest: Effort normal. No respiratory distress.  Abdominal: Soft. He exhibits no distension. There is no guarding.  Obese abdomen  Neurological: He is alert and oriented to person, place, and time.  Skin: Skin is warm and dry.  Nursing note and vitals reviewed.   Vital Signs: BP 98/70 (BP Location: Right Arm)   Pulse 91   Temp 98 F (36.7 C) (Oral)   Resp 20   Ht '5\' 8"'$  (1.727 m)   Wt 106 kg (233 lb 9.6 oz)   SpO2 99%   BMI 35.52 kg/m  Pain Assessment: No/denies pain   Pain Score: 0-No pain   SpO2: SpO2: 99 % O2 Device:SpO2: 99 % O2 Flow Rate: .   IO: Intake/output summary:  Intake/Output Summary (Last 24 hours) at 01/16/16 1503 Last data filed at 01/16/16 0900  Gross per 24 hour  Intake          2277.08 ml  Output                0 ml  Net          2277.08 ml    LBM: Last BM Date: 01/15/16 Baseline Weight: Weight: 106.6 kg (235 lb) Most recent weight: Weight: 106 kg (233 lb 9.6 oz)     Palliative Assessment/Data:   Flowsheet Rows   Flowsheet Row Most Recent Value  Intake Tab  Referral Department  Hospitalist  Unit at Time of Referral  Med/Surg Unit  Palliative Care Primary Diagnosis  Cancer  Date Notified  01/15/16    Palliative Care Type  New Palliative care  Reason for referral  Clarify Goals of Care, Advance Care Planning  Date of Admission  01/15/16  Date first seen by Palliative Care  01/16/16  # of days Palliative referral response time  1 Day(s)  # of days IP prior to Palliative referral  0  Clinical Assessment  Palliative Performance Scale Score  40%  Pain Max last 24 hours  Not able to report  Pain Min Last 24 hours  Not able to report  Dyspnea Max Last 24 Hours  Not able to report  Dyspnea Min Last 24 hours  Not able to report  Psychosocial & Spiritual Assessment  Palliative Care Outcomes  Patient/Family meeting held?  Yes  Who was at the meeting?  patient, wife and daughter.   Palliative Care Outcomes  Provided psychosocial or spiritual support, Provided advance care planning  Patient/Family wishes: Interventions discontinued/not started   Mechanical Ventilation  Palliative Care follow-up planned  -- [follow up while at [facility]]      Time In: 1405 Time Out: 1500 Time Total: 55 minutes Greater than 50%  of this time was spent counseling and coordinating care related to the above assessment and plan.  Signed by: Drue Novel, NP   Please contact Palliative Medicine Team phone at [phone] for questions and concerns.  For individual provider: See Shea Evans

## 2016-01-16 NOTE — Progress Notes (Signed)
PROGRESS NOTE    Carl Simmons  NOM:[id number] DOB: 1960-10-28 DOA: 01/15/2016 PCP: [name], MD    Brief Narrative: Carl Simmons is a 55 y.o. 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    Assessment & Plan:   Principal Problem:   Colitis Active Problems:   Adenocarcinoma of right lung (HCC)   Smoking greater than 40 pack years   Neutropenia (HCC)   Elevated lactic acid level   Moderate malnutrition (HCC)   Hyperglycemia  Colitis; presents with rectal bleeding. CT scan showed colitis.  continue with zosyn to treat colitis.  Start clear liquid diet   Rectal bleeding; in setting of colitis.  Follow hb.  No need for blood transfusion at this time.   Leukopenia;  Secondary to recent chemo.  Discussed with Dr Whitney Muse. Ok to give Neupogen. Granix ordered. Repeat WBC in am , might need further doses.   Right buttock abscess; Drain on admission by Dr Thurnell Garbe;  Local care. No significant drainage or edema notice.  Would continue with Vancomycin due to leukopenia.   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   Hyponatremia; continue with IV fluids.   Hypomagnesemia, hypophosphatemia; replete mg IV. Replete phosphorus orally. Repeat labs in am.     DVT prophylaxis: SCD, hold lovenox due to rectal bleeding.  Code Status: DNR Family Communication: wife at bedside,  Disposition Plan: remain inpatient.   Consultants:   Dr Whitney Muse.   Procedures: none   Antimicrobials:   Zosyn, vancomycin    Subjective: He is feeling better , no significant blood per rectum today. Denies abdominal pain   Objective: Vitals:   01/15/16 2030 01/15/16 2100 01/15/16 2200 01/16/16 0548  BP: 122/84 120/87 110/86 98/70  Pulse:   88 91  Resp: '15 12 20 20  '$ Temp:    98 F (36.7 C)  TempSrc:    Oral  SpO2:    97% 99%  Weight:   106 kg (233 lb 9.6 oz)   Height:   '5\' 8"'$  (1.727 m)     Intake/Output Summary (Last 24 hours) at 01/16/16 1204 Last data filed at 01/16/16 9628  Gross per 24 hour  Intake          2277.08 ml  Output                0 ml  Net          2277.08 ml   Filed Weights   01/15/16 1222 01/15/16 2200  Weight: 106.6 kg (235 lb) 106 kg (233 lb 9.6 oz)    Examination:  General exam: Appears calm and comfortable  Respiratory system: Clear to auscultation. Respiratory effort normal. Cardiovascular system: S1 & S2 heard, RRR. No JVD, murmurs, rubs, gallops or clicks. No pedal edema. Gastrointestinal system: Abdomen is nondistended, soft and nontender. No organomegaly or masses felt. Normal bowel sounds heard. Central nervous system: Alert and oriented. No focal neurological deficits. Extremities: Symmetric 5 x 5 power. Skin: No rashes, lesions or ulcers Psychiatry: Judgement and insight appear normal. Mood & affect appropriate.     Data Reviewed: I have personally reviewed following labs and imaging studies  CBC:  Recent Labs Lab 01/15/16 1255 01/16/16 0507  WBC 1.6* 1.5*  NEUTROABS 0.3*  --   HGB 13.6 11.7*  HCT 40.7 34.7*  MCV 99.5 98.0  PLT 219 210  Basic Metabolic Panel:  Recent Labs Lab 01/15/16 1255 01/15/16 2243 01/16/16 0507  NA 136  --  133*  K 3.4*  --  3.6  CL 101  --  103  CO2 26  --  26  GLUCOSE 145*  --  86  BUN 19  --  11  CREATININE 0.70  --  0.59*  CALCIUM 8.4*  --  7.6*  MG  --  1.5*  --   PHOS  --  1.5*  --    GFR: Estimated Creatinine Clearance: 120.2 mL/min (by C-G formula based on SCr of 0.59 mg/dL (L)). Liver Function Tests:  Recent Labs Lab 01/15/16 1305  AST 26  ALT 21  ALKPHOS 49  BILITOT 0.9  PROT 5.6*  ALBUMIN 3.1*    Recent Labs Lab 01/15/16 1305  LIPASE 23   No results for input(s): AMMONIA in the last 168 hours. Coagulation Profile:  Recent Labs Lab 01/15/16 2226  INR 1.04   Cardiac  Enzymes:  Recent Labs Lab 01/15/16 1305  TROPONINI <0.03   BNP (last 3 results) No results for input(s): PROBNP in the last 8760 hours. HbA1C: No results for input(s): HGBA1C in the last 72 hours. CBG: No results for input(s): GLUCAP in the last 168 hours. Lipid Profile: No results for input(s): CHOL, HDL, LDLCALC, TRIG, CHOLHDL, LDLDIRECT in the last 72 hours. Thyroid Function Tests: No results for input(s): TSH, T4TOTAL, FREET4, T3FREE, THYROIDAB in the last 72 hours. Anemia Panel: No results for input(s): VITAMINB12, FOLATE, FERRITIN, TIBC, IRON, RETICCTPCT in the last 72 hours. Sepsis Labs:  Recent Labs Lab 01/15/16 1333 01/15/16 1717 01/15/16 2226 01/16/16 0048  PROCALCITON  --   --  <0.10  --   LATICACIDVEN 2.7* 1.5 1.0 0.7    Recent Results (from the past 240 hour(s))  Wound or Superficial Culture     Status: None (Preliminary result)   Collection Time: 01/15/16  1:39 PM  Result Value Ref Range Status   Specimen Description ABSCESS  Final   Special Requests NONE  Final   Gram Stain PENDING  Incomplete   Culture   Final    TOO YOUNG TO READ Performed at [facility]    Report Status PENDING  Incomplete  Blood Culture (routine x 2)     Status: None (Preliminary result)   Collection Time: 01/15/16  2:42 PM  Result Value Ref Range Status   Specimen Description BLOOD RIGHT HAND  Final   Special Requests BOTTLES DRAWN AEROBIC AND ANAEROBIC 6CC  Final   Culture NO GROWTH < 24 HOURS  Final   Report Status PENDING  Incomplete  Blood Culture (routine x 2)     Status: None (Preliminary result)   Collection Time: 01/15/16  2:47 PM  Result Value Ref Range Status   Specimen Description LEFT ANTECUBITAL  Final   Special Requests BOTTLES DRAWN AEROBIC AND ANAEROBIC 6CC  Final   Culture NO GROWTH < 24 HOURS  Final   Report Status PENDING  Incomplete         Radiology Studies: Ct Abdomen Pelvis W Contrast  Result Date: 01/15/2016 CLINICAL DATA:  Rectal  bleeding yesterday. EXAM: CT ABDOMEN AND PELVIS WITH CONTRAST TECHNIQUE: Multidetector CT imaging of the abdomen and pelvis was performed using the standard protocol following bolus administration of intravenous contrast. CONTRAST:  129m ISOVUE-300 IOPAMIDOL (ISOVUE-300) INJECTION 61% COMPARISON:  None. FINDINGS: Lower chest: 5.4 cm right lower lobe pulmonary mass. Hepatobiliary: Mildly diminutive size of the liver with a  micronodular contour or concerning for hepatocellular disease. No focal hepatic mass. No gallstones, gallbladder wall thickening, or biliary dilatation. Pancreas: Unremarkable. No pancreatic ductal dilatation or surrounding inflammatory changes. Spleen: Normal in size without focal abnormality. Adrenals/Urinary Tract: Adrenal glands are unremarkable. Kidneys are normal, without renal calculi, focal lesion, or hydronephrosis. Bladder is unremarkable. Stomach/Bowel: No pneumatosis, pneumoperitoneum or portal venous gas. Bowel wall thickening involving the sigmoid colon and to a lesser extent descending colon concerning for colitis. Normal appendix. No bowel dilatation to suggest bowel obstruction. Vascular/Lymphatic: Abdominal aortic atherosclerosis. No lymphadenopathy. Reproductive: Prostate is unremarkable. Other: No fluid collection or hematoma.  No abdominal wall hernia. Musculoskeletal: No lytic or sclerotic osseous lesion. Degenerative disc disease with disc height loss at L5-S1 with facet arthropathy. IMPRESSION: 1. Bowel wall thickening involving the sigmoid colon and to a lesser extent descending colon concerning for colitis. 2. 5.4 cm right lower lobe pulmonary mass most consistent with primary lung malignancy. 3.  Aortic Atherosclerosis (ICD10-170.0) Electronically Signed   By: Kathreen Devoid   On: 01/15/2016 17:11   Dg Chest Port 1 View  Result Date: 01/15/2016 CLINICAL DATA:  Rectal bleeding. Shortness of breath. Right-sided lung cancer. EXAM: PORTABLE CHEST 1 VIEW COMPARISON:  PET  of 9 days ago. Most recent chest radiograph of 12/07/2014. FINDINGS: A right-sided Port-A-Cath is looped in its low cervical course, similar. Terminates at the high SVC. Cardiomegaly accentuated by AP portable technique. No pleural effusion or pneumothorax. No overt congestive failure. Right lower lobe lung mass, grossly similar. No lobar consolidation. IMPRESSION: Mild cardiomegaly, without congestive failure or explanation for shortness of breath. Right lower lobe lung mass, as before. Electronically Signed   By: Abigail Miyamoto M.D.   On: 01/15/2016 15:13        Scheduled Meds: . [START ON 02/06/2016] cyanocobalamin  1,000 mcg Intramuscular Q30 days  . enoxaparin (LOVENOX) injection  40 mg Subcutaneous Q24H  . folic acid  1 mg Oral Daily  . magnesium sulfate 1 - 4 g bolus IVPB  2 g Intravenous Once  . nicotine  21 mg Transdermal Daily  . pantoprazole  40 mg Oral Daily  . piperacillin-tazobactam (ZOSYN)  IV  3.375 g Intravenous Q8H  . potassium phosphate (monobasic)  500 mg Oral BID WC   Continuous Infusions: . lactated ringers 125 mL/hr at 01/15/16 2237     LOS: 1 day    Time spent: 35 minutes.     Elmarie Shiley, MD [facility] Pager [phone]  If 7PM-7AM, please contact night-coverage [email or web address] Password [facility] 01/16/2016, 12:04 PM

## 2016-01-16 NOTE — Telephone Encounter (Signed)
pts wife called and stated that [facility] had bowel movement that was bright red.  I told her that she needed to take him to the ER to be checked out.  She verbalized understanding.

## 2016-01-16 NOTE — Progress Notes (Signed)
Initial Nutrition Assessment  DOCUMENTATION CODES:  Obesity unspecified  INTERVENTION:  Ensure Enlive po BID, each supplement provides 350 kcal and 20 grams of protein  Handouts titled "Fatigue" and "Taste and Changes" from Nutrition in Oncology practice group  Coupons for Ensure, Boost, Glucerna  Verbal education, though was significantly hampered.   NUTRITION DIAGNOSIS:  Inadequate oral intake related to Fatigue, taste changes as evidenced by per patient/family report and loss of 17 lbs x 5 months.   GOAL:  Patient will meet greater than or equal to 90% of their needs  MONITOR:  PO intake, Supplement acceptance, Diet advancement, Labs, Weight trends, I & O's  REASON FOR ASSESSMENT:  Consult Assessment of nutrition requirement/status  ASSESSMENT:  55 y/o Cirrhosis and metastatic lung cancer. Presents with [facility]. CT scan revealed colitis. Admitted for treatment.  Pt reports that he had not had any sifnificant side effects up until he started his new chemo regimen (starting 10/12). Now he has signifcant taste changes. He cannont describe the taste, but says foods just taste wrong. Everything still smells good though and he says he actually will still eat the foods even if they dont taste good. He has noticed that sweeter items still taste about how they should.  Current eating habits include eating 2 meals a day, eating out often and drinking large amount of coffee, mt dew and chocolate whole milk. He notes the mt dew doesn't taste right now.    RD asked pt to continue to drink the chocolate whole milk. He has Ensure at home, but infrequently drinks. RD stated he could receives cases of this for free at cancer center. He didn't seem too interested at this time. RD confirmed that sweeter foods often will taste the most "normal". Asked him to try adding sugar, whipped cream, sweet/tangy sauces or other sweet toppings to his foods.    Pt also is suffering from severe fatigue, though  this has been present since he was diagnosed over a year ago. RD asked that he keep easy to prepare items in house; eating 2x per day is not ideal.   Education session was significantly hampered by patients tendency to ramble and go on tangents about irrelevant topics. Additionally, he slur his words and at times is unintelligible.   He says his UBW was ~240s. Per chart review, looks to have lost only a few lbs in the last month, but 17 lbs in the last 5 months.   NFPE: WDL, does appear to have slightly sunken eyes/orbital wasting.  Medications: Folate, ppi, KCL, IV abx, b12, LR infusion Labs:  Neutropenic, Phos: 1.5, Mag: 1.5, Albumin: 3.1   Recent Labs Lab 01/15/16 1255 01/15/16 2243 01/16/16 0507  NA 136  --  133*  K 3.4*  --  3.6  CL 101  --  103  CO2 26  --  26  BUN 19  --  11  CREATININE 0.70  --  0.59*  CALCIUM 8.4*  --  7.6*  MG  --  1.5*  --   PHOS  --  1.5*  --   GLUCOSE 145*  --  86    Diet Order:  Diet NPO time specified Except for: Sips with Meds  Skin:  Reviewed, no issues  Last BM:  12/20  Height:  Ht Readings from Last 1 Encounters:  01/15/16 '5\' 8"'$  (1.727 m)   Weight:  Wt Readings from Last 1 Encounters:  01/15/16 233 lb 9.6 oz (106 kg)   Wt Readings from  Last 10 Encounters:  01/15/16 233 lb 9.6 oz (106 kg)  01/09/16 229 lb 3.2 oz (104 kg)  12/30/15 233 lb (105.7 kg)  12/18/15 236 lb 6.4 oz (107.2 kg)  11/28/15 242 lb (109.8 kg)  11/07/15 242 lb (109.8 kg)  10/24/15 241 lb 9.6 oz (109.6 kg)  10/10/15 238 lb 14.4 oz (108.4 kg)  09/26/15 237 lb 9.6 oz (107.8 kg)  09/19/15 244 lb 3.2 oz (110.8 kg)   Ideal Body Weight:  70 kg  BMI:  Body mass index is 35.52 kg/m.  Estimated Nutritional Needs:  Kcal:  1900-2100 kcals (18-20 kcal/kg bw) Protein:  85-100 (1.2-1.4 kcal/kg bw) Fluid:  1.9-2.1 L fluid  EDUCATION NEEDS:  No education needs identified at this time  Burtis Junes RD, LDN, [place] [facility] Pager: [phone] 01/16/2016 11:24  AM

## 2016-01-17 ENCOUNTER — Encounter (HOSPITAL_COMMUNITY): Payer: BLUE CROSS/BLUE SHIELD

## 2016-01-17 ENCOUNTER — Encounter (HOSPITAL_COMMUNITY): Payer: Self-pay

## 2016-01-17 DIAGNOSIS — K529 Noninfective gastroenteritis and colitis, unspecified: Principal | ICD-10-CM

## 2016-01-17 LAB — URINE CULTURE: Culture: <10000 — AB

## 2016-01-17 LAB — CBC
HEMATOCRIT: 33 % — AB (ref 39.0–52.0)
Hemoglobin: 11.2 g/dL — ABNORMAL LOW (ref 13.0–17.0)
MCH: 33.3 pg (ref 26.0–34.0)
MCHC: 33.9 g/dL (ref 30.0–36.0)
MCV: 98.2 fL (ref 78.0–100.0)
PLATELETS: 194 10*3/uL (ref 150–400)
RBC: 3.36 MIL/uL — ABNORMAL LOW (ref 4.22–5.81)
RDW: 17.3 % — AB (ref 11.5–15.5)
WBC: 1.6 10*3/uL — AB (ref 4.0–10.5)

## 2016-01-17 LAB — BASIC METABOLIC PANEL
ANION GAP: 6 (ref 5–15)
BUN: 6 mg/dL (ref 6–20)
CALCIUM: 7.6 mg/dL — AB (ref 8.9–10.3)
CO2: 23 mmol/L (ref 22–32)
CREATININE: 0.62 mg/dL (ref 0.61–1.24)
Chloride: 104 mmol/L (ref 101–111)
GLUCOSE: 93 mg/dL (ref 65–99)
Potassium: 3.3 mmol/L — ABNORMAL LOW (ref 3.5–5.1)
Sodium: 133 mmol/L — ABNORMAL LOW (ref 135–145)

## 2016-01-17 LAB — MAGNESIUM: Magnesium: 1.8 mg/dL (ref 1.7–2.4)

## 2016-01-17 LAB — PHOSPHORUS: Phosphorus: 1.1 mg/dL — ABNORMAL LOW (ref 2.5–4.6)

## 2016-01-17 NOTE — Progress Notes (Signed)
Nutrition:  Patient scheduled for phone visit today with RD at [facility]  Chart reviewed and currently hospitalized. Seen by RD during admission.   Will attempt contact with patient on 1/5.  Carl Simmons B. Zenia Resides, Carl Simmons, Carl Simmons (pager)

## 2016-01-17 NOTE — Progress Notes (Signed)
Pt on cardiac monitoring. There is no order in [facility] for patient to be monitored. MD notified and received verbal order to discontinue cardiac monitoring.

## 2016-01-17 NOTE — Progress Notes (Signed)
PROGRESS NOTE    Carl Simmons  YKZ:[id number] DOB: [birth year] DOA: 01/15/2016 PCP: [name], MD    Brief Narrative: Carl Simmons is a 55 y.o. 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    Assessment & Plan:   Principal Problem:   Colitis Active Problems:   Adenocarcinoma of right lung (HCC)   Smoking greater than 40 pack years   Neutropenia (HCC)   Elevated lactic acid level   Moderate malnutrition (HCC)   Hyperglycemia   Palliative care encounter   Goals of care, counseling/discussion  Colitis; presents with rectal bleeding. CT scan showed colitis.  continue zosyn for colitis.  Advance to full liquid diet.  Rectal bleeding; in setting of colitis.  Monitor hgb levels, currently stable. No need for blood transfusion at this time.   Leukopenia;  Secondary to recent chemo.  Discussed with Dr Whitney Muse. Ok to give Neupogen. Granix ordered. Repeat WBC in am , might need further doses.   Right buttock abscess; Drain on admission by Dr Thurnell Garbe;  Continue local care. No significant drainage or edema notice.  Would continue with Vancomycin due to leukopenia. 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   Hyponatremia; continue with IV fluids.   Hypomagnesemia,  - resolved Hypophosphatemia - Continue to replete phosphorus orally. Repeat lab in am.    DVT prophylaxis: SCD, hold lovenox due to rectal bleeding.  Code Status: DNR Family Communication: wife at bedside,  Disposition Plan: remain inpatient.   Consultants:   Dr Whitney Muse.   Procedures: none   Antimicrobials:   Zosyn, vancomycin    Subjective: He is feeling better today. No new complaints. States that bleeding has slowed down.  Objective: Vitals:   01/16/16 0548 01/16/16 1540 01/16/16  2306 01/17/16 0609  BP: 98/70 101/72 114/72 112/76  Pulse: 91 87 87 82  Resp: '20 20 20 20  '$ Temp: 98 F (36.7 C) 97.8 F (36.6 C) 98.3 F (36.8 C) 98 F (36.7 C)  TempSrc: Oral Oral Oral Oral  SpO2: 99% 98% 96% 96%  Weight:      Height:        Intake/Output Summary (Last 24 hours) at 01/17/16 1432 Last data filed at 01/17/16 1100  Gross per 24 hour  Intake          2081.67 ml  Output                0 ml  Net          2081.67 ml   Filed Weights   01/15/16 1222 01/15/16 2200  Weight: 106.6 kg (235 lb) 106 kg (233 lb 9.6 oz)    Examination:  General exam: Appears calm and comfortable  Respiratory system: Clear to auscultation. Respiratory effort normal. Cardiovascular system: S1 & S2 heard, RRR. No JVD, murmurs, rubs, gallops or clicks. No pedal edema. Gastrointestinal system: Abdomen is nondistended, soft and nontender. No organomegaly or masses felt. Normal bowel sounds heard. Central nervous system: Alert and oriented. No focal neurological deficits. Extremities: Symmetric 5 x 5 power. Skin: No rashes, lesions or ulcers Psychiatry: Judgement and insight appear normal. Mood & affect appropriate.   Data Reviewed: I have personally reviewed following labs and imaging studies  CBC:  Recent Labs Lab 01/15/16 1255 01/16/16 0507 01/17/16 0524  WBC 1.6* 1.5* 1.6*  NEUTROABS  0.3*  --   --   HGB 13.6 11.7* 11.2*  HCT 40.7 34.7* 33.0*  MCV 99.5 98.0 98.2  PLT 219 210 735   Basic Metabolic Panel:  Recent Labs Lab 01/15/16 1255 01/15/16 2243 01/16/16 0507 01/17/16 0524  NA 136  --  133* 133*  K 3.4*  --  3.6 3.3*  CL 101  --  103 104  CO2 26  --  26 23  GLUCOSE 145*  --  86 93  BUN 19  --  11 6  CREATININE 0.70  --  0.59* 0.62  CALCIUM 8.4*  --  7.6* 7.6*  MG  --  1.5*  --  1.8  PHOS  --  1.5*  --  1.1*   GFR: Estimated Creatinine Clearance: 120.2 mL/min (by C-G formula based on SCr of 0.62 mg/dL). Liver Function Tests:  Recent Labs Lab 01/15/16 1305    AST 26  ALT 21  ALKPHOS 49  BILITOT 0.9  PROT 5.6*  ALBUMIN 3.1*    Recent Labs Lab 01/15/16 1305  LIPASE 23   No results for input(s): AMMONIA in the last 168 hours. Coagulation Profile:  Recent Labs Lab 01/15/16 2226  INR 1.04   Cardiac Enzymes:  Recent Labs Lab 01/15/16 1305  TROPONINI <0.03   BNP (last 3 results) No results for input(s): PROBNP in the last 8760 hours. HbA1C: No results for input(s): HGBA1C in the last 72 hours. CBG: No results for input(s): GLUCAP in the last 168 hours. Lipid Profile: No results for input(s): CHOL, HDL, LDLCALC, TRIG, CHOLHDL, LDLDIRECT in the last 72 hours. Thyroid Function Tests: No results for input(s): TSH, T4TOTAL, FREET4, T3FREE, THYROIDAB in the last 72 hours. Anemia Panel: No results for input(s): VITAMINB12, FOLATE, FERRITIN, TIBC, IRON, RETICCTPCT in the last 72 hours. Sepsis Labs:  Recent Labs Lab 01/15/16 1333 01/15/16 1717 01/15/16 2226 01/16/16 0048  PROCALCITON  --   --  <0.10  --   LATICACIDVEN 2.7* 1.5 1.0 0.7    Recent Results (from the past 240 hour(s))  Wound or Superficial Culture     Status: None (Preliminary result)   Collection Time: 01/15/16  1:39 PM  Result Value Ref Range Status   Specimen Description ABSCESS  Final   Special Requests NONE  Final   Gram Stain   Final    ABUNDANT WBC PRESENT, PREDOMINANTLY PMN ABUNDANT GRAM POSITIVE COCCI ABUNDANT GRAM NEGATIVE RODS [facility]formed at [facility]  Final   Report Status PENDING  Incomplete  Blood Culture (routine x 2)     Status: None (Preliminary result)   Collection Time: 01/15/16  2:42 PM  Result Value Ref Range Status   Specimen Description BLOOD RIGHT HAND  Final   Special Requests BOTTLES DRAWN AEROBIC AND ANAEROBIC [place]  Final   Culture NO GROWTH 2 DAYS  Final   Report Status PENDING  Incomplete  Urine culture     Status: Abnormal   Collection Time: 01/15/16  2:42 PM  Result Value  Ref Range Status   Specimen Description URINE, CLEAN CATCH  Final   Special Requests NONE  Final   Culture (A)  Final    <10,000 COLONIES/mL INSIGNIFICANT GROWTH Performed at [facility]    Report Status 01/17/2016 FINAL  Final  Blood Culture (routine x 2)     Status: None (Preliminary result)   Collection Time: 01/15/16  2:47 PM  Result Value Ref Range Status  Specimen Description LEFT ANTECUBITAL  Final   Special Requests BOTTLES DRAWN AEROBIC AND ANAEROBIC 6CC  Final   Culture NO GROWTH 2 DAYS  Final   Report Status PENDING  Incomplete         Radiology Studies: Ct Abdomen Pelvis W Contrast  Result Date: 01/15/2016 CLINICAL DATA:  Rectal bleeding yesterday. EXAM: CT ABDOMEN AND PELVIS WITH CONTRAST TECHNIQUE: Multidetector CT imaging of the abdomen and pelvis was performed using the standard protocol following bolus administration of intravenous contrast. CONTRAST:  113m ISOVUE-300 IOPAMIDOL (ISOVUE-300) INJECTION 61% COMPARISON:  None. FINDINGS: Lower chest: 5.4 cm right lower lobe pulmonary mass. Hepatobiliary: Mildly diminutive size of the liver with a micronodular contour or concerning for hepatocellular disease. No focal hepatic mass. No gallstones, gallbladder wall thickening, or biliary dilatation. Pancreas: Unremarkable. No pancreatic ductal dilatation or surrounding inflammatory changes. Spleen: Normal in size without focal abnormality. Adrenals/Urinary Tract: Adrenal glands are unremarkable. Kidneys are normal, without renal calculi, focal lesion, or hydronephrosis. Bladder is unremarkable. Stomach/Bowel: No pneumatosis, pneumoperitoneum or portal venous gas. Bowel wall thickening involving the sigmoid colon and to a lesser extent descending colon concerning for colitis. Normal appendix. No bowel dilatation to suggest bowel obstruction. Vascular/Lymphatic: Abdominal aortic atherosclerosis. No lymphadenopathy. Reproductive: Prostate is unremarkable. Other: No fluid  collection or hematoma.  No abdominal wall hernia. Musculoskeletal: No lytic or sclerotic osseous lesion. Degenerative disc disease with disc height loss at L5-S1 with facet arthropathy. IMPRESSION: 1. Bowel wall thickening involving the sigmoid colon and to a lesser extent descending colon concerning for colitis. 2. 5.4 cm right lower lobe pulmonary mass most consistent with primary lung malignancy. 3.  Aortic Atherosclerosis (ICD10-170.0) Electronically Signed   By: HKathreen Devoid  On: 01/15/2016 17:11   Dg Chest Port 1 View  Result Date: 01/15/2016 CLINICAL DATA:  Rectal bleeding. Shortness of breath. Right-sided lung cancer. EXAM: PORTABLE CHEST 1 VIEW COMPARISON:  PET of 9 days ago. Most recent chest radiograph of 12/07/2014. FINDINGS: A right-sided Port-A-Cath is looped in its low cervical course, similar. Terminates at the high SVC. Cardiomegaly accentuated by AP portable technique. No pleural effusion or pneumothorax. No overt congestive failure. Right lower lobe lung mass, grossly similar. No lobar consolidation. IMPRESSION: Mild cardiomegaly, without congestive failure or explanation for shortness of breath. Right lower lobe lung mass, as before. Electronically Signed   By: KAbigail MiyamotoM.D.   On: 01/15/2016 15:13        Scheduled Meds: . [START ON 02/06/2016] cyanocobalamin  1,000 mcg Intramuscular Q30 days  . feeding supplement (ENSURE ENLIVE)  237 mL Oral BID BM  . folic acid  1 mg Oral Daily  . nicotine  21 mg Transdermal Daily  . pantoprazole  40 mg Oral Daily  . phosphorus  500 mg Oral BID WC  . piperacillin-tazobactam (ZOSYN)  IV  3.375 g Intravenous Q8H  . vancomycin  1,000 mg Intravenous Q12H   Continuous Infusions: . sodium chloride 100 mL/hr at 01/17/16 0348     LOS: 2 days    Time spent: 35 minutes.     VVelvet Bathe MD [facility] Pager [phone] If 7PM-7AM, please contact night-coverage [email or web address] Password [email or web address]12/22/2017, 2:32 PM

## 2016-01-17 NOTE — Care Management Note (Signed)
Case Management Note  Patient Details  Name: Carl Simmons MRN: [id number] Date of Birth: [birth year]  Subjective/Objective:    Patient adm for rectal bleed/colitis. He  has adenocarcinoma of lung with bony metastasis,  Currently receiving treatments. He is ind with ADL's. No DME [place] [facility]. He states he does not need nor want any HH services. He plans to return home at discharge.      Action/Plan: Anticipate DC home with self care.    Expected Discharge Date:      01/18/2016            Expected Discharge Plan:  Home/Self Care  In-House Referral:  Hospice / Palliative Care  Discharge planning Services  CM Consult  Post Acute Care Choice:  NA Choice offered to:  NA  DME Arranged:    DME Agency:     HH Arranged:    HH Agency:     Status of Service:  Completed, signed off  If discussed at [employer] of Stay Meetings, dates discussed:    Additional Comments:  Burnett Spray, Chauncey Reading, RN 01/17/2016, 12:54 PM

## 2016-01-18 LAB — PHOSPHORUS: Phosphorus: 1.3 mg/dL — ABNORMAL LOW (ref 2.5–4.6)

## 2016-01-18 LAB — BASIC METABOLIC PANEL
ANION GAP: 8 (ref 5–15)
CHLORIDE: 104 mmol/L (ref 101–111)
CO2: 22 mmol/L (ref 22–32)
Calcium: 7.2 mg/dL — ABNORMAL LOW (ref 8.9–10.3)
Creatinine, Ser: 0.73 mg/dL (ref 0.61–1.24)
Glucose, Bld: 82 mg/dL (ref 65–99)
POTASSIUM: 2.9 mmol/L — AB (ref 3.5–5.1)
SODIUM: 134 mmol/L — AB (ref 135–145)

## 2016-01-18 LAB — CBC
HEMATOCRIT: 30.7 % — AB (ref 39.0–52.0)
HEMOGLOBIN: 10.5 g/dL — AB (ref 13.0–17.0)
MCH: 33.9 pg (ref 26.0–34.0)
MCHC: 34.2 g/dL (ref 30.0–36.0)
MCV: 99 fL (ref 78.0–100.0)
Platelets: 171 10*3/uL (ref 150–400)
RBC: 3.1 MIL/uL — AB (ref 4.22–5.81)
RDW: 17.7 % — ABNORMAL HIGH (ref 11.5–15.5)
WBC: 2.3 10*3/uL — ABNORMAL LOW (ref 4.0–10.5)

## 2016-01-18 MED ORDER — DOXYCYCLINE HYCLATE 100 MG PO TABS
100.0000 mg | ORAL_TABLET | Freq: Two times a day (BID) | ORAL | Status: DC
  Administered 2016-01-18 – 2016-01-19 (×3): 100 mg via ORAL
  Filled 2016-01-18 (×3): qty 1

## 2016-01-18 MED ORDER — AMOXICILLIN-POT CLAVULANATE 875-125 MG PO TABS
1.0000 | ORAL_TABLET | Freq: Two times a day (BID) | ORAL | Status: DC
  Administered 2016-01-18 – 2016-01-19 (×3): 1 via ORAL
  Filled 2016-01-18 (×3): qty 1

## 2016-01-18 MED ORDER — POTASSIUM CHLORIDE CRYS ER 20 MEQ PO TBCR
40.0000 meq | EXTENDED_RELEASE_TABLET | Freq: Two times a day (BID) | ORAL | Status: AC
  Administered 2016-01-18 (×2): 40 meq via ORAL
  Filled 2016-01-18 (×2): qty 2

## 2016-01-18 NOTE — Progress Notes (Signed)
PROGRESS NOTE    Carl Simmons  PRF:[id number] DOB: 1960-05-06 DOA: 01/15/2016 PCP: [name], MD    Brief Narrative: Carl Simmons is a 55 y.o. 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    Assessment & Plan:   Principal Problem:   Colitis Active Problems:   Adenocarcinoma of right lung (HCC)   Smoking greater than 40 pack years   Neutropenia (HCC)   Elevated lactic acid level   Moderate malnutrition (HCC)   Hyperglycemia   Palliative care encounter   Goals of care, counseling/discussion  Colitis; presents with rectal bleeding. CT scan showed colitis.  continue zosyn for colitis. Transition to oral antibiotic next am with continued improvement. Advance diet.   Rectal bleeding; in setting of colitis.  Monitor hgb levels, currently stable. No need for blood transfusion at this time.   Leukopenia;  Secondary to recent chemo.  Discussed with Dr Whitney Muse. Ok to give Neupogen. Granix ordered. Repeat WBC in am , might need further doses.   Right buttock abscess; Drain on admission by Dr Thurnell Garbe;  Continue local care. No significant drainage or edema notice.  Would continue with Vancomycin due to leukopenia. 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   Hyponatremia - improved with fluids. Will advance diet  Hypomagnesemia,  - resolved  Hypophosphatemia - Continue to replete phosphorus orally. Repeat lab in am.  DVT prophylaxis: SCD, hold lovenox due to rectal bleeding.  Code Status: DNR Family Communication: wife at bedside,  Disposition Plan: d/c next am with continued improvement.  Consultants:   Dr Whitney Muse.   Procedures: none   Antimicrobials:   Zosyn, vancomycin    Subjective: No reported bleeding. No acute issues  overnight.  Objective: Vitals:   01/17/16 0609 01/17/16 1500 01/17/16 2301 01/18/16 0453  BP: 112/76 113/76 104/77 116/74  Pulse: 82 91 91 79  Resp: 20 (!) '21 20 20  '$ Temp: 98 F (36.7 C) 97.8 F (36.6 C) 98.3 F (36.8 C) 97.9 F (36.6 C)  TempSrc: Oral Oral Oral Oral  SpO2: 96% 98% 99% 97%  Weight:      Height:        Intake/Output Summary (Last 24 hours) at 01/18/16 1452 Last data filed at 01/18/16 1437  Gross per 24 hour  Intake             1080 ml  Output                0 ml  Net             1080 ml   Filed Weights   01/15/16 1222 01/15/16 2200  Weight: 106.6 kg (235 lb) 106 kg (233 lb 9.6 oz)    Examination:  General exam: Appears calm and comfortable  Respiratory system: Clear to auscultation. Respiratory effort normal. Cardiovascular system: S1 & S2 heard, RRR. No JVD, murmurs, rubs, gallops or clicks. No pedal edema. Gastrointestinal system: Abdomen is nondistended, soft and nontender. No organomegaly or masses felt. Normal bowel sounds heard. Central nervous system: Alert and oriented. No focal neurological deficits. Extremities: Symmetric 5 x 5 power. Skin: No rashes, lesions or ulcers Psychiatry: Judgement and insight appear normal. Mood & affect appropriate.   Data Reviewed: I have personally reviewed following labs and imaging studies  CBC:  Recent Labs Lab 01/15/16 1255 01/16/16  0507 01/17/16 0524 01/18/16 0648  WBC 1.6* 1.5* 1.6* 2.3*  NEUTROABS 0.3*  --   --   --   HGB 13.6 11.7* 11.2* 10.5*  HCT 40.7 34.7* 33.0* 30.7*  MCV 99.5 98.0 98.2 99.0  PLT 219 210 194 195   Basic Metabolic Panel:  Recent Labs Lab 01/15/16 1255 01/15/16 2243 01/16/16 0507 01/17/16 0524 01/18/16 0648  NA 136  --  133* 133* 134*  K 3.4*  --  3.6 3.3* 2.9*  CL 101  --  103 104 104  CO2 26  --  '26 23 22  '$ GLUCOSE 145*  --  86 93 82  BUN 19  --  11 6 <5*  CREATININE 0.70  --  0.59* 0.62 0.73  CALCIUM 8.4*  --  7.6* 7.6* 7.2*  MG  --  1.5*  --  1.8  --    PHOS  --  1.5*  --  1.1* 1.3*   GFR: Estimated Creatinine Clearance: 120.2 mL/min (by C-G formula based on SCr of 0.73 mg/dL). Liver Function Tests:  Recent Labs Lab 01/15/16 1305  AST 26  ALT 21  ALKPHOS 49  BILITOT 0.9  PROT 5.6*  ALBUMIN 3.1*    Recent Labs Lab 01/15/16 1305  LIPASE 23   No results for input(s): AMMONIA in the last 168 hours. Coagulation Profile:  Recent Labs Lab 01/15/16 2226  INR 1.04   Cardiac Enzymes:  Recent Labs Lab 01/15/16 1305  TROPONINI <0.03   BNP (last 3 results) No results for input(s): PROBNP in the last 8760 hours. HbA1C: No results for input(s): HGBA1C in the last 72 hours. CBG: No results for input(s): GLUCAP in the last 168 hours. Lipid Profile: No results for input(s): CHOL, HDL, LDLCALC, TRIG, CHOLHDL, LDLDIRECT in the last 72 hours. Thyroid Function Tests: No results for input(s): TSH, T4TOTAL, FREET4, T3FREE, THYROIDAB in the last 72 hours. Anemia Panel: No results for input(s): VITAMINB12, FOLATE, FERRITIN, TIBC, IRON, RETICCTPCT in the last 72 hours. Sepsis Labs:  Recent Labs Lab 01/15/16 1333 01/15/16 1717 01/15/16 2226 01/16/16 0048  PROCALCITON  --   --  <0.10  --   LATICACIDVEN 2.7* 1.5 1.0 0.7    Recent Results (from the past 240 hour(s))  Wound or Superficial Culture     Status: None (Preliminary result)   Collection Time: 01/15/16  1:39 PM  Result Value Ref Range Status   Specimen Description ABSCESS  Final   Special Requests NONE  Final   Gram Stain   Final    ABUNDANT WBC PRESENT, PREDOMINANTLY PMN ABUNDANT GRAM POSITIVE COCCI ABUNDANT GRAM NEGATIVE RODS Performed at [facility]    Culture   Final    ABUNDANT PROTEUS MIRABILIS FEW STAPHYLOCOCCUS LUGDUNENSIS    Report Status PENDING  Incomplete   Organism ID, Bacteria PROTEUS MIRABILIS  Final      Susceptibility   Proteus mirabilis - MIC*    AMPICILLIN <=2 SENSITIVE Sensitive     CEFAZOLIN <=4 SENSITIVE Sensitive      CEFEPIME <=1 SENSITIVE Sensitive     CEFTAZIDIME <=1 SENSITIVE Sensitive     CEFTRIAXONE <=1 SENSITIVE Sensitive     CIPROFLOXACIN <=0.25 SENSITIVE Sensitive     GENTAMICIN <=1 SENSITIVE Sensitive     IMIPENEM 2 SENSITIVE Sensitive     TRIMETH/SULFA <=20 SENSITIVE Sensitive     AMPICILLIN/SULBACTAM <=2 SENSITIVE Sensitive     PIP/TAZO <=4 SENSITIVE Sensitive     * ABUNDANT PROTEUS MIRABILIS  Blood Culture (routine x  2)     Status: None (Preliminary result)   Collection Time: 01/15/16  2:42 PM  Result Value Ref Range Status   Specimen Description BLOOD RIGHT HAND  Final   Special Requests BOTTLES DRAWN AEROBIC AND ANAEROBIC 6CC  Final   Culture NO GROWTH 3 DAYS  Final   Report Status PENDING  Incomplete  Urine culture     Status: Abnormal   Collection Time: 01/15/16  2:42 PM  Result Value Ref Range Status   Specimen Description URINE, CLEAN CATCH  Final   Special Requests NONE  Final   Culture (A)  Final    <10,000 COLONIES/mL INSIGNIFICANT GROWTH Performed at [facility]    Report Status 01/17/2016 FINAL  Final  Blood Culture (routine x 2)     Status: None (Preliminary result)   Collection Time: 01/15/16  2:47 PM  Result Value Ref Range Status   Specimen Description LEFT ANTECUBITAL  Final   Special Requests BOTTLES DRAWN AEROBIC AND ANAEROBIC 6CC  Final   Culture NO GROWTH 3 DAYS  Final   Report Status PENDING  Incomplete         Radiology Studies: No results found.      Scheduled Meds: . amoxicillin-clavulanate  1 tablet Oral Q12H  . [START ON 02/06/2016] cyanocobalamin  1,000 mcg Intramuscular Q30 days  . doxycycline  100 mg Oral Q12H  . feeding supplement (ENSURE ENLIVE)  237 mL Oral BID BM  . folic acid  1 mg Oral Daily  . nicotine  21 mg Transdermal Daily  . pantoprazole  40 mg Oral Daily  . phosphorus  500 mg Oral BID WC  . potassium chloride  40 mEq Oral BID   Continuous Infusions: . sodium chloride 100 mL/hr at 01/18/16 0527     LOS: 3  days    Time spent: 35 minutes.   Velvet Bathe, MD [facility] Pager [phone]  If 7PM-7AM, please contact night-coverage [email or web address] Password [email or web address] 01/18/2016, 2:52 PM

## 2016-01-19 LAB — CBC
HCT: 33.5 % — ABNORMAL LOW (ref 39.0–52.0)
Hemoglobin: 11.2 g/dL — ABNORMAL LOW (ref 13.0–17.0)
MCH: 33.6 pg (ref 26.0–34.0)
MCHC: 33.4 g/dL (ref 30.0–36.0)
MCV: 100.6 fL — AB (ref 78.0–100.0)
PLATELETS: 179 10*3/uL (ref 150–400)
RBC: 3.33 MIL/uL — ABNORMAL LOW (ref 4.22–5.81)
RDW: 18.3 % — AB (ref 11.5–15.5)
WBC: 4.2 10*3/uL (ref 4.0–10.5)

## 2016-01-19 LAB — AEROBIC CULTURE W GRAM STAIN (SUPERFICIAL SPECIMEN)

## 2016-01-19 LAB — BASIC METABOLIC PANEL
Anion gap: 5 (ref 5–15)
CALCIUM: 7.3 mg/dL — AB (ref 8.9–10.3)
CO2: 24 mmol/L (ref 22–32)
CREATININE: 0.78 mg/dL (ref 0.61–1.24)
Chloride: 109 mmol/L (ref 101–111)
GFR calc Af Amer: >60 mL/min (ref >60)
GFR calc non Af Amer: >60 mL/min (ref >60)
GLUCOSE: 115 mg/dL — AB (ref 65–99)
Potassium: 3 mmol/L — ABNORMAL LOW (ref 3.5–5.1)
Sodium: 138 mmol/L (ref 135–145)

## 2016-01-19 LAB — AEROBIC CULTURE  (SUPERFICIAL SPECIMEN)

## 2016-01-19 MED ORDER — K PHOS MONO-SOD PHOS DI & MONO 155-852-130 MG PO TABS
500.0000 mg | ORAL_TABLET | Freq: Two times a day (BID) | ORAL | 0 refills | Status: AC
Start: 2016-01-19 — End: 2016-01-26

## 2016-01-19 MED ORDER — DOXYCYCLINE HYCLATE 100 MG PO TABS
100.0000 mg | ORAL_TABLET | Freq: Two times a day (BID) | ORAL | 0 refills | Status: AC
Start: 2016-01-19 — End: 2016-01-22

## 2016-01-19 MED ORDER — ENSURE ENLIVE PO LIQD: 237.0000 mL | Freq: Two times a day (BID) | ORAL | 12 refills | Status: DC

## 2016-01-19 MED ORDER — AMOXICILLIN-POT CLAVULANATE 875-125 MG PO TABS: 1.0000 | ORAL_TABLET | Freq: Two times a day (BID) | ORAL | 0 refills | Status: AC

## 2016-01-19 MED ORDER — HEPARIN SOD (PORK) LOCK FLUSH 100 UNIT/ML IV SOLN
500.0000 [IU] | INTRAVENOUS | Status: DC | PRN
  Filled 2016-01-19: qty 5

## 2016-01-19 NOTE — Progress Notes (Signed)
Patient states understanding of discharge instructions.  

## 2016-01-19 NOTE — Discharge Summary (Signed)
Physician Discharge Summary  Carl Simmons LYY:[id number] DOB: 06-05-1960 DOA: 01/15/2016  PCP: Carl Burly, MD  Admit date: 01/15/2016 Discharge date: 01/19/2016  Time spent: > 35 minutes  Recommendations for Outpatient Follow-up:  1. Monitor K levels 2. 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   Hospital Course:  Colitis; presents with rectal bleeding. CT scan showed colitis.  Improved on zosyn for colitis. Bleeding ceased. Transitioned to oral antibiotic with continued improvement. Tolerating diet.   Rectal bleeding; in setting of colitis.  Resolved  Leukopenia;  Secondary to recent chemo.  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 Patient to follow-up  Hyponatremia - Improved  Hypomagnesemia,  - resolved  Hypophosphatemia - Discharge on oral replacement. Recommending reassessment within the next week   Procedures:  None  Consultations:  Palliative care  Discharge Exam: Vitals:   01/18/16 2200 01/19/16 0537  BP: 115/75 112/84  Pulse: 87 84  Resp: 20 20  Temp: 98.3 F (36.8 C) 97.3 F (36.3 C)    General: Pt in nad,  alert and awake Cardiovascular: rrr, no rubs Respiratory: no increase wob, no wheezes  Discharge Instructions   Discharge Instructions    Call MD for:  extreme fatigue    Complete by:  As directed    Call MD for:  severe uncontrolled pain    Complete by:  As directed    Call MD for:  temperature >100.4    Complete by:  As directed    Diet - low sodium heart healthy    Complete by:  As directed    Discharge instructions    Complete by:  As directed    Reassess phosphorus levels and K levels within the next 1 week.   Increase activity slowly    Complete by:  As directed      Current Discharge Medication List    START taking these medications   Details  amoxicillin-clavulanate (AUGMENTIN) 875-125 MG tablet Take 1 tablet by mouth every 12 (twelve) hours. Qty: 12 tablet, Refills: 0    doxycycline (VIBRA-TABS) 100 MG tablet Take 1 tablet (100 mg total) by mouth every 12 (twelve) hours. Qty: 6 tablet, Refills: 0    feeding supplement, ENSURE ENLIVE, (ENSURE ENLIVE) LIQD Take 237 mLs by mouth 2 (two) times daily between meals. Qty: 237 mL, Refills: 12    phosphorus (K PHOS NEUTRAL) 155-852-130 MG tablet Take 2 tablets (500 mg total) by mouth 2 (two) times daily with a meal. Qty: 14 tablet, Refills: 0      CONTINUE these medications which have NOT CHANGED   Details  cyanocobalamin (,VITAMIN B-12,) 1000 MCG/ML injection Inject 1,000 mcg into the muscle every 30 (  thirty) days.    folic acid (FOLVITE) 1 MG tablet Take 1 mg by mouth daily.     HYDROcodone-acetaminophen (NORCO/VICODIN) 5-325 MG tablet Take 1 tablet by mouth every 4 (four) hours as needed.    Omega-3 Fatty Acids (FISH OIL) 1000 MG CAPS TAKE 1 CAPSULE BY MOUTH EVERY DAY Qty: 30 capsule, Refills: 1    omeprazole (PRILOSEC) 20 MG capsule Take 1 capsule (20 mg total) by mouth daily. Qty: 30 capsule, Refills: 2    potassium chloride SA (K-DUR,KLOR-CON) 20 MEQ tablet Take 1 tablet (20 mEq total) by mouth 2 (two)  times daily. Qty: 60 tablet, Refills: 0   Associated Diagnoses: Adenocarcinoma of right lung ([place]); Bone metastasis (HCC)      STOP taking these medications     dexamethasone (DECADRON) 4 MG tablet      naproxen (NAPROSYN) 500 MG tablet      predniSONE (DELTASONE) 20 MG tablet        No Known Allergies    The results of significant diagnostics from this hospitalization (including imaging, microbiology, ancillary and laboratory) are listed below for reference.    Significant Diagnostic Studies: Mr Carl Simmons YQ Contrast  Result Date: 12/26/2015 CLINICAL DATA:  55 y/o  M; lung cancer.  SRS restaging. EXAM: MRI HEAD WITHOUT AND WITH CONTRAST TECHNIQUE: Multiplanar, multiecho pulse sequences of the brain and surrounding structures were obtained without and with intravenous contrast. CONTRAST:  20 cc MultiHance. COMPARISON:  09/16/2015 MRI head. FINDINGS: Brain: Previously identified enhancing metastasis within the cerebellum and the supratentorial brain are either resolved or markedly diminished in comparison with the prior MRI. There is residual punctate enhancement within the right inferior cerebellar metastasis (series 11, image 25) and within the right posterior superior cerebellar metastasis only appreciated on the sagittal postcontrast sequence (series 13, image 16). No new focus of abnormal enhancement in the brain is identified. Vasogenic edema associated with several metastasis on the prior study is no longer appreciated. A few residual nonenhancing small foci of T2 FLAIR hyperintensity are compatible with minimal chronic microvascular ischemic changes. No diffusion signal abnormality. No evidence for intracranial hemorrhage. Vascular: Normal flow voids. Skull and upper cervical spine: Normal marrow signal. Sinuses/Orbits: Midline cystic lesion centered in the adenoid tonsils with thick rim enhancement and septations measuring up to 19 mm (series 11 image 27) is stable. Other: Interval  resection of a right posterior temporal scalp lesion without residual abnormal enhancement. IMPRESSION: 1. Previously identified enhancing metastasis within cerebellum and supratentorial brain are either resolved or markedly diminished in comparison with the prior MRI. No new metastasis identified. 2. Interval resolution of vasogenic edema associated with metastasis. 3. No evidence for acute/early subacute infarction or intracranial hemorrhage. 4. Into for section of right posterior temporal scalp lesion. No appreciable residual neoplasm. 5. Adenoid tonsil cystic lesion is stable, possibly Thornwaldt cyst or nasopharyngeal mucous retention cyst. This can be further assessed with direct visualization. Electronically Signed   By: Kristine Simmons M.D.   On: 12/26/2015 14:54   Ct Abdomen Pelvis W Contrast  Result Date: 01/15/2016 CLINICAL DATA:  Rectal bleeding yesterday. EXAM: CT ABDOMEN AND PELVIS WITH CONTRAST TECHNIQUE: Multidetector CT imaging of the abdomen and pelvis was performed using the standard protocol following bolus administration of intravenous contrast. CONTRAST:  152m ISOVUE-300 IOPAMIDOL (ISOVUE-300) INJECTION 61% COMPARISON:  None. FINDINGS: Lower chest: 5.4 cm right lower lobe pulmonary mass. Hepatobiliary: Mildly diminutive size of the liver with a micronodular contour or concerning for hepatocellular disease. No focal hepatic  mass. No gallstones, gallbladder wall thickening, or biliary dilatation. Pancreas: Unremarkable. No pancreatic ductal dilatation or surrounding inflammatory changes. Spleen: Normal in size without focal abnormality. Adrenals/Urinary Tract: Adrenal glands are unremarkable. Kidneys are normal, without renal calculi, focal lesion, or hydronephrosis. Bladder is unremarkable. Stomach/Bowel: No pneumatosis, pneumoperitoneum or portal venous gas. Bowel wall thickening involving the sigmoid colon and to a lesser extent descending colon concerning for colitis. Normal  appendix. No bowel dilatation to suggest bowel obstruction. Vascular/Lymphatic: Abdominal aortic atherosclerosis. No lymphadenopathy. Reproductive: Prostate is unremarkable. Other: No fluid collection or hematoma.  No abdominal wall hernia. Musculoskeletal: No lytic or sclerotic osseous lesion. Degenerative disc disease with disc height loss at L5-S1 with facet arthropathy. IMPRESSION: 1. Bowel wall thickening involving the sigmoid colon and to a lesser extent descending colon concerning for colitis. 2. 5.4 cm right lower lobe pulmonary mass most consistent with primary lung malignancy. 3.  Aortic Atherosclerosis (ICD10-170.0) Electronically Signed   By: Kathreen Devoid   On: 01/15/2016 17:11   Nm Pet Image Restag (ps) Skull Base To Thigh  Result Date: 01/06/2016 CLINICAL DATA:  Subsequent treatment strategy for non-small cell lung cancer. EXAM: NUCLEAR MEDICINE PET SKULL BASE TO THIGH TECHNIQUE: 11.5 mCi F-18 FDG was injected intravenously. Full-ring PET imaging was performed from the skull base to thigh after the radiotracer. CT data was obtained and used for attenuation correction and anatomic localization. FASTING BLOOD GLUCOSE:  Value: 127 mg/dl COMPARISON:  PET-CT 10/31/2015 FINDINGS: NECK No hypermetabolic lymph nodes in the neck. CHEST Reduction in metabolic activity of mediastinal lymph nodes. Lymph nodes remain intensely hypermetabolic but decreased. For example prevascular lymph node on image 58 of fused data set with SUV max equal 7.3 decreased from 15.6. RIGHT lower paratracheal lymph node with SUV max equal 11.4 decreased from 15.0. No new mediastinal adenopathy. The dominant mass in the RIGHT lower lobe measures 4.8 cm. There is metabolic activity along the medial circumference of the lesion with SUV max equal 9.2. This is decreased from the circumferential activity with SUV max equal 21. Hypermetabolic RIGHT lower lobe nodule measuring 10 mm on image 78, series 4 is similar. ABDOMEN/PELVIS There is  decreased metabolic activity associated with the RIGHT adrenal gland which is decrease in size additionally. SUV max equal 5.6 decreased from SUV max equal 23. No new hypermetabolic activity in the abdomen pelvis. Hypermetabolic periportal lymph nodes again noted. SKELETON Decreased metabolic activity within the LEFT femoral neck with SUV max equal 5.7 decreased from 16.7. Persistent hypermetabolic activity associated with a RIGHT rib fracture laterally (image 73 of fused series). IMPRESSION: 1. Overall positive response to therapy with reduction in size and metabolic activity of metastatic lesions. 2. Decreased in the peripheral metabolic activity of the large RIGHT lower lobe mass. 3. Decrease in metabolic activity of metastatic mediastinal lymph nodes. 4. Decrease in metabolic activity of RIGHT adrenal gland metastasis. 5. Decreased metabolic activity skeletal metastasis Electronically Signed   By: Suzy Bouchard M.D.   On: 01/06/2016 13:14   Dg Chest Port 1 View  Result Date: 01/15/2016 CLINICAL DATA:  Rectal bleeding. Shortness of breath. Right-sided lung cancer. EXAM: PORTABLE CHEST 1 VIEW COMPARISON:  PET of 9 days ago. Most recent chest radiograph of 12/07/2014. FINDINGS: A right-sided Port-A-Cath is looped in its low cervical course, similar. Terminates at the high SVC. Cardiomegaly accentuated by AP portable technique. No pleural effusion or pneumothorax. No overt congestive failure. Right lower lobe lung mass, grossly similar. No lobar consolidation. IMPRESSION: Mild cardiomegaly, without congestive failure  or explanation for shortness of breath. Right lower lobe lung mass, as before. Electronically Signed   By: Abigail Miyamoto M.D.   On: 01/15/2016 15:13    Microbiology: Recent Results (from the past 240 hour(s))  Wound or Superficial Culture     Status: None   Collection Time: 01/15/16  1:39 PM  Result Value Ref Range Status   Specimen Description ABSCESS  Final   Special Requests NONE   Final   Gram Stain   Final    ABUNDANT WBC PRESENT, PREDOMINANTLY PMN ABUNDANT GRAM POSITIVE COCCI ABUNDANT GRAM NEGATIVE RODS Performed at [facility]    Culture   Final    [place]    Report Status 01/19/2016 FINAL  Final   Organism ID, Bacteria PROTEUS MIRABILIS  Final   Organism ID, Bacteria STAPHYLOCOCCUS LUGDUNENSIS  Final      Susceptibility   Staphylococcus lugdunensis - MIC*    CIPROFLOXACIN <=0.5 SENSITIVE Sensitive     ERYTHROMYCIN <=0.25 SENSITIVE Sensitive     GENTAMICIN <=0.5 SENSITIVE Sensitive     OXACILLIN 1 SENSITIVE Sensitive     TETRACYCLINE <=1 SENSITIVE Sensitive     VANCOMYCIN <=0.5 SENSITIVE Sensitive     TRIMETH/SULFA <=10 SENSITIVE Sensitive     CLINDAMYCIN <=0.25 SENSITIVE Sensitive     RIFAMPIN <=0.5 SENSITIVE Sensitive     Inducible Clindamycin NEGATIVE Sensitive     * FEW STAPHYLOCOCCUS LUGDUNENSIS   Proteus mirabilis - MIC*    AMPICILLIN <=2 SENSITIVE Sensitive     CEFAZOLIN <=4 SENSITIVE Sensitive     CEFEPIME <=1 SENSITIVE Sensitive     CEFTAZIDIME <=1 SENSITIVE Sensitive     CEFTRIAXONE <=1 SENSITIVE Sensitive     CIPROFLOXACIN <=0.25 SENSITIVE Sensitive     GENTAMICIN <=1 SENSITIVE Sensitive     IMIPENEM 2 SENSITIVE Sensitive     TRIMETH/SULFA <=20 SENSITIVE Sensitive     AMPICILLIN/SULBACTAM <=2 SENSITIVE Sensitive     PIP/TAZO <=4 SENSITIVE Sensitive     * ABUNDANT PROTEUS MIRABILIS  Blood Culture (routine x 2)     Status: None (Preliminary result)   Collection Time: 01/15/16  2:42 PM  Result Value Ref Range Status   Specimen Description BLOOD RIGHT HAND  Final   Special Requests BOTTLES DRAWN AEROBIC AND ANAEROBIC 6CC  Final   Culture NO GROWTH 4 DAYS  Final   Report Status PENDING  Incomplete  Urine culture     Status: Abnormal   Collection Time: 01/15/16  2:42 PM  Result Value Ref Range Status   Specimen Description URINE, CLEAN CATCH  Final   Special Requests NONE   Final   Culture (A)  Final    <10,000 COLONIES/mL INSIGNIFICANT GROWTH Performed at [facility]    Report Status 01/17/2016 FINAL  Final  Blood Culture (routine x 2)     Status: None (Preliminary result)   Collection Time: 01/15/16  2:47 PM  Result Value Ref Range Status   Specimen Description LEFT ANTECUBITAL  Final   Special Requests BOTTLES DRAWN AEROBIC AND ANAEROBIC [facility]  Final   Culture NO GROWTH 4 DAYS  Final   Report Status PENDING  Incomplete     Labs: Basic Metabolic Panel:  Recent Labs Lab 01/15/16 1255 01/15/16 2243 01/16/16 0507 01/17/16 0524 01/18/16 0648 01/19/16 1004  NA 136  --  133* 133* 134* 138  K 3.4*  --  3.6 3.3* 2.9* 3.0*  CL 101  --  103 104 104 109  CO2 26  --  '26 23 22 24  '$ GLUCOSE 145*  --  86 93 82 115*  BUN 19  --  11 6 <5* <5*  CREATININE 0.70  --  0.59* 0.62 0.73 0.78  CALCIUM 8.4*  --  7.6* 7.6* 7.2* 7.3*  MG  --  1.5*  --  1.8  --   --   PHOS  --  1.5*  --  1.1* 1.3*  --    Liver Function Tests:  Recent Labs Lab 01/15/16 1305  AST 26  ALT 21  ALKPHOS 49  BILITOT 0.9  PROT 5.6*  ALBUMIN 3.1*    Recent Labs Lab 01/15/16 1305  LIPASE 23   No results for input(s): AMMONIA in the last 168 hours. CBC:  Recent Labs Lab 01/15/16 1255 01/16/16 0507 01/17/16 0524 01/18/16 0648 01/19/16 1004  WBC 1.6* 1.5* 1.6* 2.3* 4.2  NEUTROABS 0.3*  --   --   --   --   HGB 13.6 11.7* 11.2* 10.5* 11.2*  HCT 40.7 34.7* 33.0* 30.7* 33.5*  MCV 99.5 98.0 98.2 99.0 100.6*  PLT 219 210 194 171 179   Cardiac Enzymes:  Recent Labs Lab 01/15/16 1305  TROPONINI <0.03   BNP: BNP (last 3 results) No results for input(s): BNP in the last 8760 hours.  ProBNP (last 3 results) No results for input(s): PROBNP in the last 8760 hours.  CBG: No results for input(s): GLUCAP in the last 168 hours.   Signed:  Velvet Bathe MD.  [facility] 01/19/2016, 2:50 PM

## 2016-01-21 LAB — CULTURE, BLOOD (ROUTINE X 2)
CULTURE: NO GROWTH
Culture: NO GROWTH

## 2016-01-30 ENCOUNTER — Encounter (HOSPITAL_BASED_OUTPATIENT_CLINIC_OR_DEPARTMENT_OTHER): Payer: BLUE CROSS/BLUE SHIELD | Admitting: Oncology

## 2016-01-30 ENCOUNTER — Encounter (HOSPITAL_COMMUNITY): Payer: Self-pay

## 2016-01-30 ENCOUNTER — Encounter (HOSPITAL_COMMUNITY): Payer: BLUE CROSS/BLUE SHIELD | Attending: Hematology & Oncology

## 2016-01-30 VITALS — BP 117/93 | HR 108 | Resp 20 | Wt 224.2 lb

## 2016-01-30 DIAGNOSIS — C3491 Malignant neoplasm of unspecified part of right bronchus or lung: Secondary | ICD-10-CM | POA: Diagnosis not present

## 2016-01-30 DIAGNOSIS — Z5112 Encounter for antineoplastic immunotherapy: Secondary | ICD-10-CM | POA: Diagnosis not present

## 2016-01-30 DIAGNOSIS — C792 Secondary malignant neoplasm of skin: Secondary | ICD-10-CM | POA: Diagnosis not present

## 2016-01-30 DIAGNOSIS — C7951 Secondary malignant neoplasm of bone: Secondary | ICD-10-CM | POA: Diagnosis not present

## 2016-01-30 DIAGNOSIS — Z5111 Encounter for antineoplastic chemotherapy: Secondary | ICD-10-CM

## 2016-01-30 DIAGNOSIS — C7931 Secondary malignant neoplasm of brain: Secondary | ICD-10-CM

## 2016-01-30 LAB — CBC WITH DIFFERENTIAL/PLATELET
Basophils Absolute: 0 10*3/uL (ref 0.0–0.1)
Basophils Relative: 0 %
EOS ABS: 0 10*3/uL (ref 0.0–0.7)
Eosinophils Relative: 0 %
HEMATOCRIT: 38.4 % — AB (ref 39.0–52.0)
HEMOGLOBIN: 12.5 g/dL — AB (ref 13.0–17.0)
LYMPHS ABS: 1.7 10*3/uL (ref 0.7–4.0)
LYMPHS PCT: 15 %
MCH: 33.5 pg (ref 26.0–34.0)
MCHC: 32.6 g/dL (ref 30.0–36.0)
MCV: 102.9 fL — AB (ref 78.0–100.0)
MONOS PCT: 3 %
Monocytes Absolute: 0.4 10*3/uL (ref 0.1–1.0)
NEUTROS ABS: 9.1 10*3/uL — AB (ref 1.7–7.7)
NEUTROS PCT: 82 %
Platelets: 437 10*3/uL — ABNORMAL HIGH (ref 150–400)
RBC: 3.73 MIL/uL — ABNORMAL LOW (ref 4.22–5.81)
RDW: 18.2 % — ABNORMAL HIGH (ref 11.5–15.5)
WBC: 11.2 10*3/uL — ABNORMAL HIGH (ref 4.0–10.5)

## 2016-01-30 LAB — COMPREHENSIVE METABOLIC PANEL
ALK PHOS: 57 U/L (ref 38–126)
ALT: 24 U/L (ref 17–63)
ANION GAP: 9 (ref 5–15)
AST: 31 U/L (ref 15–41)
Albumin: 2.9 g/dL — ABNORMAL LOW (ref 3.5–5.0)
BUN: 11 mg/dL (ref 6–20)
CALCIUM: 9 mg/dL (ref 8.9–10.3)
CO2: 25 mmol/L (ref 22–32)
CREATININE: 0.82 mg/dL (ref 0.61–1.24)
Chloride: 103 mmol/L (ref 101–111)
GFR calc non Af Amer: >60 mL/min (ref >60)
Glucose, Bld: 172 mg/dL — ABNORMAL HIGH (ref 65–99)
Potassium: 3.5 mmol/L (ref 3.5–5.1)
Sodium: 137 mmol/L (ref 135–145)
TOTAL PROTEIN: 6.5 g/dL (ref 6.5–8.1)
Total Bilirubin: 0.5 mg/dL (ref 0.3–1.2)

## 2016-01-30 LAB — TSH: TSH: 0.32 u[IU]/mL — ABNORMAL LOW (ref 0.350–4.500)

## 2016-01-30 MED ORDER — ACETAMINOPHEN 325 MG PO TABS
650.0000 mg | ORAL_TABLET | Freq: Once | ORAL | Status: AC
  Administered 2016-01-30: 650 mg via ORAL
  Filled 2016-01-30: qty 2

## 2016-01-30 MED ORDER — SODIUM CHLORIDE 0.9% FLUSH: 10.0000 mL | INTRAVENOUS | Status: DC | PRN

## 2016-01-30 MED ORDER — DOCETAXEL CHEMO INJECTION 160 MG/16ML
75.0000 mg/m2 | Freq: Once | INTRAVENOUS | Status: AC
  Administered 2016-01-30: 170 mg via INTRAVENOUS
  Filled 2016-01-30: qty 17

## 2016-01-30 MED ORDER — SODIUM CHLORIDE 0.9 % IV SOLN
Freq: Once | INTRAVENOUS | Status: AC
  Administered 2016-01-30: 10:00:00 via INTRAVENOUS

## 2016-01-30 MED ORDER — RAMUCIRUMAB CHEMO INJECTION 500 MG/50ML
10.0000 mg/kg | Freq: Once | INTRAVENOUS | Status: AC
  Administered 2016-01-30: 1100 mg via INTRAVENOUS
  Filled 2016-01-30: qty 100

## 2016-01-30 MED ORDER — DIPHENHYDRAMINE HCL 50 MG/ML IJ SOLN
50.0000 mg | Freq: Once | INTRAMUSCULAR | Status: AC
  Administered 2016-01-30: 50 mg via INTRAVENOUS
  Filled 2016-01-30: qty 1

## 2016-01-30 MED ORDER — DEXAMETHASONE SODIUM PHOSPHATE 10 MG/ML IJ SOLN
10.0000 mg | Freq: Once | INTRAMUSCULAR | Status: AC
  Administered 2016-01-30: 10 mg via INTRAVENOUS
  Filled 2016-01-30: qty 1

## 2016-01-30 MED ORDER — PEGFILGRASTIM 6 MG/0.6ML ~~LOC~~ PSKT
6.0000 mg | PREFILLED_SYRINGE | Freq: Once | SUBCUTANEOUS | Status: AC
  Administered 2016-01-30: 6 mg via SUBCUTANEOUS
  Filled 2016-01-30: qty 0.6

## 2016-01-30 MED ORDER — HEPARIN SOD (PORK) LOCK FLUSH 100 UNIT/ML IV SOLN
500.0000 [IU] | Freq: Once | INTRAVENOUS | Status: AC | PRN
  Administered 2016-01-30: 500 [IU]
  Filled 2016-01-30: qty 5

## 2016-01-30 MED ORDER — DENOSUMAB 120 MG/1.7ML ~~LOC~~ SOLN
120.0000 mg | Freq: Once | SUBCUTANEOUS | Status: AC
  Administered 2016-01-30: 120 mg via SUBCUTANEOUS
  Filled 2016-01-30: qty 1.7

## 2016-01-30 NOTE — Progress Notes (Signed)
[name], MD [place] [address]  Adenocarcinoma of right lung [facility]) - Plan: NM PET Image Restag (PS) Skull Base To Thigh, TSH  Bone metastasis (HCC) - Plan: NM PET Image Restag (PS) Skull Base To Thigh  Hypophosphatemia - Plan: Phosphorus  CURRENT THERAPY: Docetaxel + Ramucirumab beginning on 11/07/2015  INTERVAL HISTORY: Paschal Dopp 56 y.o. 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 Treated by Dr. Tressie Stalker at [facility] with Carboplatin/Pemetrexed every 21 days x 6 cycles (12/10/2014- 03/25/2015) with transition to maintenance Pemetrexed every 21 days with last dose being administered on 08/01/2015. Imaging demonstrated progression of disease, resulting in a change in therapy to [place] beginning on 08/29/2015- 10/24/2015.  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 Lisbeth Renshaw. PET scan on 10/31/2015 demonstrated progression of disease resulting in a change in therapy to Docetaxel and Ramucirumab beginning on 11/07/2015.    Adenocarcinoma of right lung ([place])   11/20/2014 Procedure    Right scalp skin biopsy      11/20/2014 Pathology Results    Adenocarcinoma of lung primary. PDL-1 NEGATIVE (0% tumor proportion score); EGFR mutation NEGATIVE; no evidence of ALK gene rearrangement detected by FISH; no reaarnagement of ROS1 gene detected.      11/29/2014 PET scan    Large hypermetabolic mass within the RLL with central necrosis. Hypermetabolic right suprahilar mass with postobstructive collapse of the RUL. Subcarinal, paratracheal, prevascular and R internal mammary nodal metastases. Small R adrenal gland nodule.      12/10/2014 - 03/25/2015 Chemotherapy    Carboplatin and pemetrexed 6 cycles      02/11/2015 PET scan    Partial metabolic response to therapy, with decrease in hypermetabolic right lung masses, postobstructive right upper lobe  consolidation, thoracic lymphadenopathy and right adrenal metastasis. No new or progressive disease identified.      04/05/2015 PET scan    No significant change in hypermetabolic right lung masses and thoracic lymphadenopathy. No new or progressive disease identified.      04/15/2015 - 08/01/2015 Chemotherapy    Pemetrexed maintenance      08/14/2015 PET scan    Overall progression of metastatic lung cancer. Increasingly hypermetabolic mediastinal lymph nodes and R lung masses, enlarging R adrenal mass and new osseous lesions. Enlarging hypermetabolic R-sided scalp nodules.      08/14/2015 Progression    Progression of disease on PET imaging      08/29/2015 - 10/24/2015 Chemotherapy    Opdivo      08/29/2015 Miscellaneous    Xgeva started monthly      09/05/2015 Imaging    MRI brain- Multiple metastatic deposits in the brain. At least 9 lesions are identified. Mild edema around the 10 mm lesion in the right cerebellum.      09/27/2015 - 09/27/2015 Radiation Therapy    Dr. Lisbeth Renshaw- SRS to brain mets:  PTV target 1-10 were treated using 2 treatment plans. A single isocenter technique was utilized to treat 8 lesion simultaneously. PTV's 2 and 5 were then treated simultaneously with a second radiation treatment plan due to their close proximity. A prescription dose of 20 Gy was delivered to each lesion. ExacTrac Snap verification was performed for each couch angle.       10/31/2015 PET scan    1. Overall there has been interval progression of disease compared with the previous exam. 2.  There is an increased FDG uptake associated with the dominant mass involving the right lower lobe. Additionally, there are new hypermetabolic pre-vascular and right posterior cervical lymph nodes. 3. Significant interval increase in FDG uptake associated with right upper lobe pulmonary nodule and right adrenal gland metastases. 4. New hypermetabolic metastasis is involving the left hip. Additionally, there is  intense uptake associated with fractured right rib. Underlying lesion not excluded. 5. Increase in FDG uptake associated with lesion involving the posterior nasopharynx.      10/31/2015 Progression    Progression of disease      11/07/2015 Treatment Plan Change    D/C Opdivo and change to Docetaxel + Ramucirumab      11/07/2015 -  Chemotherapy    Docetaxel + Ramucirumab       01/06/2016 PET scan    1. Overall positive response to therapy with reduction in size and metabolic activity of metastatic lesions. 2. Decreased in the peripheral metabolic activity of the large RIGHT lower lobe mass. 3. Decrease in metabolic activity of metastatic mediastinal lymph nodes. 4. Decrease in metabolic activity of RIGHT adrenal gland metastasis. 5. Decreased metabolic activity skeletal metastasis      01/15/2016 - 01/19/2016 Hospital Admission    Admit date: 01/15/2016 Admission diagnosis: Rectal bleeding Additional comments: Colitis      01/15/2016 Imaging    CT abd/pelvis- 1. Bowel wall thickening involving the sigmoid colon and to a lesser extent descending colon concerning for colitis. 2. 5.4 cm right lower lobe pulmonary mass most consistent with primary lung malignancy. 3.  Aortic Atherosclerosis        Chart reviewed and recent hospitalization noted for colitis.  He was neutropenic as well at time of admission.  He reported to the ED with rectal bleeding.  CT imaging was performed at that time demonstrating colitis.  He was admitted for IV antibiotics and further management.  He did get a dose of Neupogen in the hospital as well.  His bowels are back to normal.  He denies any blood in his stool.  Appetite is still a big issue.  Weight loss is noted compared to his last treatment.  Weight loss is approximately 10 lbs.  He has an appointment with nutritionist tomorrow, 1/5.  He otherwise is well without any complaints.  Getting him to report complaints is difficult given his  joking personality.  His ongoing weight loss is concerning however.  Review of Systems  Constitutional: Positive for weight loss. Negative for chills and fever.  HENT: Negative.   Eyes: Negative.   Respiratory: Negative.  Negative for cough.   Cardiovascular: Negative.  Negative for chest pain.  Gastrointestinal: Negative.  Negative for blood in stool, melena, nausea and vomiting.  Genitourinary: Negative.   Musculoskeletal: Negative.   Skin: Negative.   Psychiatric/Behavioral: Negative.     Past Medical History:  Diagnosis Date  . Adenocarcinoma of right lung ([place]) 07/11/2015  . Bone metastasis ([place]) 08/24/2015  . Cirrhosis of liver ([place]) 08/14/2015   PET on 08/14/2015 demonstrates cirrhotic liver.     Past Surgical History:  Procedure Laterality Date  . right power port placement Right    right subclavian    Family History  Problem Relation Age of Onset  . Alzheimer's disease Mother 28  . CAD Mother   . Diabetes Mother   . Cancer Father     thyroid cancer  . CVA Father 56  . Lupus Sister   . Cancer Sister     metastatic ovarian  Social History   Social History  . Marital status: Married    Spouse name: N/A  . Number of children: N/A  . Years of education: N/A   Occupational History  . disabled    Social History Main Topics  . Smoking status: Current Every Day Smoker    Packs/day: 2.00  . Smokeless tobacco: Never Used  . Alcohol use No  . Drug use: No  . Sexual activity: Not on file   Other Topics Concern  . Not on file   Social History Narrative  . No narrative on file     PHYSICAL EXAMINATION  ECOG PERFORMANCE STATUS: 1 - Symptomatic but completely ambulatory  There were no vitals filed for this visit.  Vitals - 1 value per visit 07/01/628  SYSTOLIC 160  DIASTOLIC 93  Pulse 109  Temperature   Respirations 20  Weight (lb) 224.2     GENERAL:alert, no distress, well nourished, well developed, comfortable, cooperative, smiling and  accompanied by wife, in chemo-recliner.  Peri-orbital darkened skin. SKIN: skin color, texture, turgor are normal, no rashes or significant lesions HEAD: Normocephalic, No masses, lesions, tenderness or abnormalities.  EYES: normal, EOMI, Conjunctiva are pink and non-injected EARS: External ears normal OROPHARYNX:lips, buccal mucosa, and tongue normal and mucous membranes are moist  NECK: supple, trachea midline LYMPH:  no palpable lymphadenopathy BREAST:not examined LUNGS: clear to auscultation  With decreased breath sounds bilaterally. HEART: regular rate & rhythm ABDOMEN:abdomen soft, obese and normal bowel sounds BACK: Back symmetric, no curvature. EXTREMITIES:less then 2 second capillary refill, no joint deformities, effusion, or inflammation, no skin discoloration, no cyanosis, no edema. NEURO: alert & oriented x 3 with fluent speech, no focal motor/sensory deficits, gait normal   LABORATORY DATA: CBC    Component Value Date/Time   WBC 11.2 (H) 01/30/2016 0921   RBC 3.73 (L) 01/30/2016 0921   HGB 12.5 (L) 01/30/2016 0921   HCT 38.4 (L) 01/30/2016 0921   PLT 437 (H) 01/30/2016 0921   MCV 102.9 (H) 01/30/2016 0921   MCH 33.5 01/30/2016 0921   MCHC 32.6 01/30/2016 0921   RDW 18.2 (H) 01/30/2016 0921   LYMPHSABS 1.7 01/30/2016 0921   MONOABS 0.4 01/30/2016 0921   EOSABS 0.0 01/30/2016 0921   BASOSABS 0.0 01/30/2016 0921      Chemistry      Component Value Date/Time   NA 137 01/30/2016 0921   K 3.5 01/30/2016 0921   CL 103 01/30/2016 0921   CO2 25 01/30/2016 0921   BUN 11 01/30/2016 0921   CREATININE 0.82 01/30/2016 0921      Component Value Date/Time   CALCIUM 9.0 01/30/2016 0921   ALKPHOS 57 01/30/2016 0921   AST 31 01/30/2016 0921   ALT 24 01/30/2016 0921   BILITOT 0.5 01/30/2016 0921        PENDING LABS:   RADIOGRAPHIC STUDIES:  Ct Abdomen Pelvis W Contrast  Result Date: 01/15/2016 CLINICAL DATA:  Rectal bleeding yesterday. EXAM: CT ABDOMEN AND  PELVIS WITH CONTRAST TECHNIQUE: Multidetector CT imaging of the abdomen and pelvis was performed using the standard protocol following bolus administration of intravenous contrast. CONTRAST:  186m ISOVUE-300 IOPAMIDOL (ISOVUE-300) INJECTION 61% COMPARISON:  None. FINDINGS: Lower chest: 5.4 cm right lower lobe pulmonary mass. Hepatobiliary: Mildly diminutive size of the liver with a micronodular contour or concerning for hepatocellular disease. No focal hepatic mass. No gallstones, gallbladder wall thickening, or biliary dilatation. Pancreas: Unremarkable. No pancreatic ductal dilatation or surrounding inflammatory changes. Spleen: Normal in size without  focal abnormality. Adrenals/Urinary Tract: Adrenal glands are unremarkable. Kidneys are normal, without renal calculi, focal lesion, or hydronephrosis. Bladder is unremarkable. Stomach/Bowel: No pneumatosis, pneumoperitoneum or portal venous gas. Bowel wall thickening involving the sigmoid colon and to a lesser extent descending colon concerning for colitis. Normal appendix. No bowel dilatation to suggest bowel obstruction. Vascular/Lymphatic: Abdominal aortic atherosclerosis. No lymphadenopathy. Reproductive: Prostate is unremarkable. Other: No fluid collection or hematoma.  No abdominal wall hernia. Musculoskeletal: No lytic or sclerotic osseous lesion. Degenerative disc disease with disc height loss at L5-S1 with facet arthropathy. IMPRESSION: 1. Bowel wall thickening involving the sigmoid colon and to a lesser extent descending colon concerning for colitis. 2. 5.4 cm right lower lobe pulmonary mass most consistent with primary lung malignancy. 3.  Aortic Atherosclerosis (ICD10-170.0) Electronically Signed   By: Kathreen Devoid   On: 01/15/2016 17:11   Nm Pet Image Restag (ps) Skull Base To Thigh  Result Date: 01/06/2016 CLINICAL DATA:  Subsequent treatment strategy for non-small cell lung cancer. EXAM: NUCLEAR MEDICINE PET SKULL BASE TO THIGH TECHNIQUE: 11.5  mCi F-18 FDG was injected intravenously. Full-ring PET imaging was performed from the skull base to thigh after the radiotracer. CT data was obtained and used for attenuation correction and anatomic localization. FASTING BLOOD GLUCOSE:  Value: 127 mg/dl COMPARISON:  PET-CT 10/31/2015 FINDINGS: NECK No hypermetabolic lymph nodes in the neck. CHEST Reduction in metabolic activity of mediastinal lymph nodes. Lymph nodes remain intensely hypermetabolic but decreased. For example prevascular lymph node on image 58 of fused data set with SUV max equal 7.3 decreased from 15.6. RIGHT lower paratracheal lymph node with SUV max equal 11.4 decreased from 15.0. No new mediastinal adenopathy. The dominant mass in the RIGHT lower lobe measures 4.8 cm. There is metabolic activity along the medial circumference of the lesion with SUV max equal 9.2. This is decreased from the circumferential activity with SUV max equal 21. Hypermetabolic RIGHT lower lobe nodule measuring 10 mm on image 78, series 4 is similar. ABDOMEN/PELVIS There is decreased metabolic activity associated with the RIGHT adrenal gland which is decrease in size additionally. SUV max equal 5.6 decreased from SUV max equal 23. No new hypermetabolic activity in the abdomen pelvis. Hypermetabolic periportal lymph nodes again noted. SKELETON Decreased metabolic activity within the LEFT femoral neck with SUV max equal 5.7 decreased from 16.7. Persistent hypermetabolic activity associated with a RIGHT rib fracture laterally (image 73 of fused series). IMPRESSION: 1. Overall positive response to therapy with reduction in size and metabolic activity of metastatic lesions. 2. Decreased in the peripheral metabolic activity of the large RIGHT lower lobe mass. 3. Decrease in metabolic activity of metastatic mediastinal lymph nodes. 4. Decrease in metabolic activity of RIGHT adrenal gland metastasis. 5. Decreased metabolic activity skeletal metastasis Electronically Signed   By:  Suzy Bouchard M.D.   On: 01/06/2016 13:14   Dg Chest Port 1 View  Result Date: 01/15/2016 CLINICAL DATA:  Rectal bleeding. Shortness of breath. Right-sided lung cancer. EXAM: PORTABLE CHEST 1 VIEW COMPARISON:  PET of 9 days ago. Most recent chest radiograph of 12/07/2014. FINDINGS: A right-sided Port-A-Cath is looped in its low cervical course, similar. Terminates at the high SVC. Cardiomegaly accentuated by AP portable technique. No pleural effusion or pneumothorax. No overt congestive failure. Right lower lobe lung mass, grossly similar. No lobar consolidation. IMPRESSION: Mild cardiomegaly, without congestive failure or explanation for shortness of breath. Right lower lobe lung mass, as before. Electronically Signed   By: Adria Devon.D.  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  Treated by Dr. Tressie Stalker at [facility] with Carboplatin/Pemetrexed every 21 days x 6 cycles (12/10/2014- 03/25/2015) with transition to maintenance Pemetrexed every 21 days with last dose being administered on 08/01/2015. Imaging demonstrated progression of disease, resulting in a change in therapy to [place] beginning on 08/29/2015- 10/24/2015.  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 Lisbeth Renshaw. PET scan on 10/31/2015 demonstrated progression of disease resulting in a change in therapy to Docetaxel and Ramucirumab beginning on 11/07/2015.  Oncology history is updated.  Chart reviewed and recent hospitalization noted for colitis.  Of note, he was not given Neulasta with treatment prior to this hospitalization and he became neutropenic despite robust neutrophil count on day of treatment.  He was late for his appointment this AM, but I was able to squeeze him into my schedule over lunch.  Pre-treatment labs today: CBC diff, CMET, TSH.  I personally  reviewed and went over laboratory results with the patient.  The results are noted within this dictation.  Labs satisfy treatment parameters.   I will add a phosphorus level to today's lab work as well given that he was hypophosphatemic in the hospital and discharged on replacement therapy.  We will need to continue to follow TSH given his previous Opdivo treatment.  TSH is ordered for 3 weeks.  He notes change in taste.  10 lbs weight loss is noted.  He has an appointment tomorrow with nutritionist for assistance.  Restaging is due following cycle #6.  Order is placed for PET imaging in Feb 2018.  PET is preferred due to size and hypermetabolic activity of primary tumor when comparing past PET and CT imaging.  I suspect PET imaging will require peer to peer conversation prior to approval with his insurance company.    Return in 3 weeks for follow-up and cycle #6 of chemotherapy.  Bone metastasis ([place]) Bone metastases from NSCLC.  On Xgeva monthly to reduce risk for SRE.  Xgeva due today.   ORDERS PLACED FOR THIS ENCOUNTER: Orders Placed This Encounter  Procedures  . NM PET Image Restag (PS) Skull Base To Thigh  . TSH  . Phosphorus    MEDICATIONS PRESCRIBED THIS ENCOUNTER: No orders of the defined types were placed in this encounter.   THERAPY PLAN:  Continue treatment as outlined above: Docetaxel + Ramucirumab  All questions were answered. The patient knows to call the clinic with any problems, questions or concerns. We can certainly see the patient much sooner if necessary.  Patient and plan discussed with Dr. Ancil Linsey and she is in agreement with the aforementioned.   This note is electronically signed by: Doy Mince 01/30/2016 11:45 AM

## 2016-01-30 NOTE — Assessment & Plan Note (Addendum)
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  Treated by Dr. Tressie Stalker at [facility] with Carboplatin/Pemetrexed every 21 days x 6 cycles (12/10/2014- 03/25/2015) with transition to maintenance Pemetrexed every 21 days with last dose being administered on 08/01/2015. Imaging demonstrated progression of disease, resulting in a change in therapy to [place] beginning on 08/29/2015- 10/24/2015.  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 Lisbeth Renshaw. PET scan on 10/31/2015 demonstrated progression of disease resulting in a change in therapy to Docetaxel and Ramucirumab beginning on 11/07/2015.  Oncology history is updated.  Chart reviewed and recent hospitalization noted for colitis.  Of note, he was not given Neulasta with treatment prior to this hospitalization and he became neutropenic despite robust neutrophil count on day of treatment.  He was late for his appointment this AM, but I was able to squeeze him into my schedule over lunch.  Pre-treatment labs today: CBC diff, CMET, TSH.  I personally reviewed and went over laboratory results with the patient.  The results are noted within this dictation.  Labs satisfy treatment parameters.   I will add a phosphorus level to today's lab work as well given that he was hypophosphatemic in the hospital and discharged on replacement therapy.  We will need to continue to follow TSH given his previous Opdivo treatment.  TSH is ordered for 3 weeks.  He notes change in taste.  10 lbs weight loss is noted.  He has an appointment tomorrow with nutritionist for assistance.  Restaging is due following cycle #6.  Order is placed for PET imaging in Feb 2018.  PET is preferred due to size and hypermetabolic activity of primary tumor when comparing past PET and CT imaging.  I suspect PET imaging will require peer to peer conversation prior to approval with his insurance company.    Return in 3 weeks for  follow-up and cycle #6 of chemotherapy.

## 2016-01-30 NOTE — Progress Notes (Signed)
Tolerated tx w/o adverse reaction.  Alert, in no distress.  VSS.  Discharged ambulatory in c/o spouse.  

## 2016-01-30 NOTE — Assessment & Plan Note (Signed)
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  On Xgeva monthly to reduce risk for SRE.  Xgeva due today.

## 2016-01-30 NOTE — Patient Instructions (Signed)
[place] at [facility] Discharge Instructions  RECOMMENDATIONS MADE BY THE CONSULTANT AND ANY TEST RESULTS WILL BE SENT TO YOUR REFERRING PHYSICIAN.  Labs in 3 weeks including TSH  Neulasta on PRo today  Nutritionist consult as scheduled with Jolie on January 5 here in the [place] for ongoing weight loss  XGEVA injection today  Return as scheduled on 02/20/16 for follow up and treatment  Thank you for choosing [place] at [facility] to provide your oncology and hematology care.  To afford each patient quality time with our provider, please arrive at least 15 minutes before your scheduled appointment time.    If you have a lab appointment with the [facility] please come in thru the  Main Entrance and check in at the main information desk  You need to re-schedule your appointment should you arrive 10 or more minutes late.  We strive to give you quality time with our providers, and arriving late affects you and other patients whose appointments are after yours.  Also, if you no show three or more times for appointments you may be dismissed from the clinic at the providers discretion.     Again, thank you for choosing [facility]  Our hope is that these requests will decrease the amount of time that you wait before being seen by our physicians.       _____________________________________________________________  Should you have questions after your visit to [facility], please contact our office at [phone] between the hours of 8:30 a.m. and 4:30 p.m.  Voicemails left after 4:30 p.m. will not be returned until the following business day.  For prescription refill requests, have your pharmacy contact our office.       Resources For Cancer Patients and their Caregivers ? American Cancer Society: Can assist with transportation, wigs, general needs, runs Look Good Feel Better.         (579)854-5417 ? Cancer Care: Provides financial assistance, online support groups, medication/co-pay assistance.  1-800-813-HOPE 4015636340) ? [place] Assists [place] cancer patients and their families through emotional , educational and financial support.  (765) 405-6130 ? [place] DSS Where to apply for food stamps, Medicaid and utility assistance. 224-659-5850 ? [facility]: Transportation to medical appointments. 9161241401 ? Social Security Administration: May apply for disability if have a Stage IV cancer. 980-761-5139 726-314-2234 ? [employer], Disability and Transit Services: Assists with nutrition, care and transit needs. [place] Support Programs: '@10RELATIVEDAYS'$ @ > Cancer Support Group  2nd Tuesday of the month 1pm-2pm, Journey Room  > Creative Journey  3rd Tuesday of the month 1130am-1pm, Journey Room  > Look Good Feel Better  1st Wednesday of the month 10am-12 noon, Journey Room (Call [facility] to register [phone])[phone])

## 2016-01-30 NOTE — Patient Instructions (Signed)
[facility] Discharge Instructions for Patients Receiving Chemotherapy   Beginning January 23rd 2017 lab work for the [facility] will be done in the  Main lab at [facility] on 1st floor. If you have a lab appointment with the [facility] please come in thru the  Main Entrance and check in at the main information desk   Today you received the following chemotherapy agents:  Cyramza and Taxol  If you develop nausea and vomiting, or diarrhea that is not controlled by your medication, call the clinic.  The clinic phone number is [phone]. Office hours are Monday-Friday 8:30am-5:00pm.  BELOW ARE SYMPTOMS THAT SHOULD BE REPORTED IMMEDIATELY:  *FEVER GREATER THAN 101.0 F  *CHILLS WITH OR WITHOUT FEVER  NAUSEA AND VOMITING THAT IS NOT CONTROLLED WITH YOUR NAUSEA MEDICATION  *UNUSUAL SHORTNESS OF BREATH  *UNUSUAL BRUISING OR BLEEDING  TENDERNESS IN MOUTH AND THROAT WITH OR WITHOUT PRESENCE OF ULCERS  *URINARY PROBLEMS  *BOWEL PROBLEMS  UNUSUAL RASH Items with * indicate a potential emergency and should be followed up as soon as possible. If you have an emergency after office hours please contact your primary care physician or go to the nearest emergency department.  Please call the clinic during office hours if you have any questions or concerns.   You may also contact the Patient Navigator at [phone] should you have any questions or need assistance in obtaining follow up care.      Resources For Cancer Patients and their Caregivers ? American Cancer Society: Can assist with transportation, wigs, general needs, runs Look Good Feel Better.        343-236-3476 ? Cancer Care: Provides financial assistance, online support groups, medication/co-pay assistance.  1-800-813-HOPE 206-134-9813) ? [place] Assists [place] cancer patients and their families through emotional , educational and financial support.   (636)640-7903 ? [place] DSS Where to apply for food stamps, Medicaid and utility assistance. (905)645-4128 ? RCATS: Transportation to medical appointments. 5794143865 ? Social Security Administration: May apply for disability if have a Stage IV cancer. ([phone] 460-6196 (470)072-0020 ? [employer], Disability and Transit Services: Assists with nutrition, care and transit needs. [phone]

## 2016-01-31 ENCOUNTER — Ambulatory Visit (HOSPITAL_COMMUNITY): Payer: BLUE CROSS/BLUE SHIELD

## 2016-01-31 NOTE — Progress Notes (Signed)
Nutrition Assessment   Reason for Assessment:   Referral for weight loss.  ASSESSMENT:  56 year old male with lung cancer and metastatic disease. Patient currently receiving chemotherapy.  Recent hospital admission from 12/20-12/24 for rectal bleeding and colitis.  Past medical history of cirrhosis  Phone call preferred by patient and wife vs clinic visit.    Spoke with wife Carl Simmons on the phone this am.  Reports patient's appetite is better. Patient is still having problems with tasting foods.  Patient has been able recently to eat 2 boiled eggs, likes McDonald's burgers, jello with bananas, sometimes will eat banana pudding, likes potato soup, pinto beans and cornbread. Patient drinking gatorade, chocolate milk and Mt Dew per wife. Wife reports that patient seems to be able to taste sweeter foods better. Does not like ensure/boost drinks but wife wants him to try carnation instant breakfast.    Wife reports bowel movements are back to normal   Medications: folic acid, omega 3, K chloride  Labs: glucose 172 on 1/4  Anthropometrics:   Height: 68 inches Weight: 224 on 1/4. Noted on 12/20 233 lb UBW: 220-230s per wife BMI: 34  4% weight loss noted in the last 2 weeks.   Estimated Energy Needs  Kcals: 2220-2500 calories/d Protein: 101-121 g/d Fluid: 2.5 L/d  NUTRITION DIAGNOSIS: Inadequate oral intake related to cancer related treatment and recent GI bleed/colitis as evidenced by decreased po intake and 4% weight loss in 2 weeks   MALNUTRITION DIAGNOSIS: Patient meets criteria for moderate malnutrition in acute illness as evidenced by 4% weight loss in 2 weeks, eating < 75% of energy needs for > 7 days.   INTERVENTION:   Discussed strategies to assist with taste changes and ways to increase calories and protein.  Will mail handouts. Will also mail coupons for carnation instant breakfast as wife wants patient to try this supplement.   Will include business card and wife knows  to call with questions.  MONITORING, EVALUATION, GOAL: Patient to consume adequate calories and protein to prevent further weight loss.   NEXT VISIT: Phone call on 2/2, wife agreeable  Carl Simmons, [name], [name] (pager)

## 2016-02-03 ENCOUNTER — Other Ambulatory Visit (HOSPITAL_COMMUNITY): Payer: Self-pay | Admitting: Oncology

## 2016-02-03 ENCOUNTER — Other Ambulatory Visit (HOSPITAL_COMMUNITY): Payer: Self-pay | Admitting: Emergency Medicine

## 2016-02-03 DIAGNOSIS — K625 Hemorrhage of anus and rectum: Secondary | ICD-10-CM

## 2016-02-03 MED ORDER — PREDNISONE 20 MG PO TABS: ORAL_TABLET | ORAL | 0 refills | Status: DC

## 2016-02-03 NOTE — Progress Notes (Signed)
Called in prednisone 80 mg to see if it will help with his possible colitis.  Taper dose.  80 mg Monday-Friday.  Saturday 60 mg for 3 days.  Then decrease by 20 mg every 3 days until down to 10 mg.  Take 10 mg for 3 days.  Then 10 mg every other day then pt can stop taking the steroids.  Called into [facility].  Wife verbalized understanding.

## 2016-02-03 NOTE — Progress Notes (Signed)
pts wife called ans stated that pt is having rectal bleeding again sometimes when he has bowel movements.  Spoke with Kirby Crigler PA and he said to bring him in for lab work and refer pt to GI ([facility]).  Lab appt made for 02/04/2015 at 9:20 am.  [facility] office will call you with appt.  Verbalized understanding.

## 2016-02-04 ENCOUNTER — Telehealth (HOSPITAL_COMMUNITY): Payer: Self-pay | Admitting: Emergency Medicine

## 2016-02-04 ENCOUNTER — Encounter (HOSPITAL_COMMUNITY): Payer: BLUE CROSS/BLUE SHIELD

## 2016-02-04 DIAGNOSIS — C3491 Malignant neoplasm of unspecified part of right bronchus or lung: Secondary | ICD-10-CM | POA: Diagnosis not present

## 2016-02-04 DIAGNOSIS — K625 Hemorrhage of anus and rectum: Secondary | ICD-10-CM

## 2016-02-04 LAB — CBC WITH DIFFERENTIAL/PLATELET
BASOS PCT: 3 %
Basophils Absolute: 0.1 10*3/uL (ref 0.0–0.1)
Eosinophils Absolute: 0.1 10*3/uL (ref 0.0–0.7)
Eosinophils Relative: 4 %
HEMATOCRIT: 36.9 % — AB (ref 39.0–52.0)
HEMOGLOBIN: 12.5 g/dL — AB (ref 13.0–17.0)
LYMPHS PCT: 68 %
Lymphs Abs: 1.1 10*3/uL (ref 0.7–4.0)
MCH: 34.2 pg — AB (ref 26.0–34.0)
MCHC: 33.9 g/dL (ref 30.0–36.0)
MCV: 100.8 fL — AB (ref 78.0–100.0)
MONO ABS: 0.2 10*3/uL (ref 0.1–1.0)
Monocytes Relative: 13 %
NEUTROS ABS: 0.2 10*3/uL — AB (ref 1.7–7.7)
Neutrophils Relative %: 12 %
Platelets: 154 10*3/uL (ref 150–400)
RBC: 3.66 MIL/uL — ABNORMAL LOW (ref 4.22–5.81)
RDW: 17.4 % — AB (ref 11.5–15.5)
WBC: 1.6 10*3/uL — ABNORMAL LOW (ref 4.0–10.5)

## 2016-02-04 LAB — COMPREHENSIVE METABOLIC PANEL
ALT: 22 U/L (ref 17–63)
ANION GAP: 8 (ref 5–15)
AST: 23 U/L (ref 15–41)
Albumin: 3 g/dL — ABNORMAL LOW (ref 3.5–5.0)
Alkaline Phosphatase: 54 U/L (ref 38–126)
BILIRUBIN TOTAL: 1.1 mg/dL (ref 0.3–1.2)
BUN: 16 mg/dL (ref 6–20)
CO2: 27 mmol/L (ref 22–32)
Calcium: 8 mg/dL — ABNORMAL LOW (ref 8.9–10.3)
Chloride: 97 mmol/L — ABNORMAL LOW (ref 101–111)
Creatinine, Ser: 0.69 mg/dL (ref 0.61–1.24)
GFR calc Af Amer: >60 mL/min (ref >60)
Glucose, Bld: 104 mg/dL — ABNORMAL HIGH (ref 65–99)
POTASSIUM: 3.2 mmol/L — AB (ref 3.5–5.1)
Sodium: 132 mmol/L — ABNORMAL LOW (ref 135–145)
TOTAL PROTEIN: 5.7 g/dL — AB (ref 6.5–8.1)

## 2016-02-04 NOTE — Telephone Encounter (Signed)
Called pt with lab results.  Told to make sure he was taking his potassium.  She said they got the prednisone this am and she has made a calender of how he needs to take it.

## 2016-02-05 ENCOUNTER — Encounter: Payer: Self-pay | Admitting: Internal Medicine

## 2016-02-07 ENCOUNTER — Telehealth (HOSPITAL_COMMUNITY): Payer: Self-pay | Admitting: Emergency Medicine

## 2016-02-07 NOTE — Telephone Encounter (Signed)
His wife states that he seemed to be getting better but last night he started bleeding again when he passed gas.  She said it seems like when he eats liquids its better but once he starts doing solids foods again then he gets worse.   So she is getting him to do mostly liquids right now.

## 2016-02-10 ENCOUNTER — Other Ambulatory Visit (HOSPITAL_COMMUNITY): Payer: Self-pay | Admitting: Hematology & Oncology

## 2016-02-13 ENCOUNTER — Ambulatory Visit (HOSPITAL_COMMUNITY): Payer: BLUE CROSS/BLUE SHIELD

## 2016-02-13 ENCOUNTER — Other Ambulatory Visit (HOSPITAL_COMMUNITY): Payer: BLUE CROSS/BLUE SHIELD

## 2016-02-20 ENCOUNTER — Other Ambulatory Visit (HOSPITAL_COMMUNITY): Payer: Self-pay

## 2016-02-20 ENCOUNTER — Encounter (HOSPITAL_BASED_OUTPATIENT_CLINIC_OR_DEPARTMENT_OTHER): Payer: BLUE CROSS/BLUE SHIELD

## 2016-02-20 ENCOUNTER — Encounter (HOSPITAL_BASED_OUTPATIENT_CLINIC_OR_DEPARTMENT_OTHER): Payer: BLUE CROSS/BLUE SHIELD | Admitting: Adult Health

## 2016-02-20 ENCOUNTER — Other Ambulatory Visit: Payer: Self-pay | Admitting: Radiation Therapy

## 2016-02-20 ENCOUNTER — Encounter (HOSPITAL_COMMUNITY): Payer: Self-pay | Admitting: Adult Health

## 2016-02-20 VITALS — BP 125/81 | HR 93 | Temp 97.9°F | Resp 20

## 2016-02-20 VITALS — BP 123/88 | HR 122 | Resp 24 | Wt 215.4 lb

## 2016-02-20 DIAGNOSIS — C7951 Secondary malignant neoplasm of bone: Secondary | ICD-10-CM

## 2016-02-20 DIAGNOSIS — C7949 Secondary malignant neoplasm of other parts of nervous system: Principal | ICD-10-CM

## 2016-02-20 DIAGNOSIS — Z5189 Encounter for other specified aftercare: Secondary | ICD-10-CM | POA: Diagnosis not present

## 2016-02-20 DIAGNOSIS — K529 Noninfective gastroenteritis and colitis, unspecified: Secondary | ICD-10-CM

## 2016-02-20 DIAGNOSIS — Z5112 Encounter for antineoplastic immunotherapy: Secondary | ICD-10-CM | POA: Diagnosis not present

## 2016-02-20 DIAGNOSIS — Z5111 Encounter for antineoplastic chemotherapy: Secondary | ICD-10-CM | POA: Diagnosis not present

## 2016-02-20 DIAGNOSIS — C7931 Secondary malignant neoplasm of brain: Secondary | ICD-10-CM | POA: Diagnosis not present

## 2016-02-20 DIAGNOSIS — C3491 Malignant neoplasm of unspecified part of right bronchus or lung: Secondary | ICD-10-CM

## 2016-02-20 LAB — COMPREHENSIVE METABOLIC PANEL WITH GFR
ALT: 26 U/L (ref 17–63)
AST: 25 U/L (ref 15–41)
Albumin: 3.3 g/dL — ABNORMAL LOW (ref 3.5–5.0)
Alkaline Phosphatase: 66 U/L (ref 38–126)
Anion gap: 12 (ref 5–15)
BUN: 11 mg/dL (ref 6–20)
CO2: 23 mmol/L (ref 22–32)
Calcium: 8.2 mg/dL — ABNORMAL LOW (ref 8.9–10.3)
Chloride: 99 mmol/L — ABNORMAL LOW (ref 101–111)
Creatinine, Ser: 0.72 mg/dL (ref 0.61–1.24)
GFR calc Af Amer: >60 mL/min
GFR calc non Af Amer: >60 mL/min
Glucose, Bld: 194 mg/dL — ABNORMAL HIGH (ref 65–99)
Potassium: 3.6 mmol/L (ref 3.5–5.1)
Sodium: 134 mmol/L — ABNORMAL LOW (ref 135–145)
Total Bilirubin: 0.8 mg/dL (ref 0.3–1.2)
Total Protein: 6.7 g/dL (ref 6.5–8.1)

## 2016-02-20 LAB — CBC WITH DIFFERENTIAL/PLATELET
Basophils Absolute: 0 10*3/uL (ref 0.0–0.1)
Basophils Relative: 0 %
Eosinophils Absolute: 0 10*3/uL (ref 0.0–0.7)
Eosinophils Relative: 0 %
HEMATOCRIT: 40.5 % (ref 39.0–52.0)
HEMOGLOBIN: 13.4 g/dL (ref 13.0–17.0)
LYMPHS ABS: 1 10*3/uL (ref 0.7–4.0)
LYMPHS PCT: 8 %
MCH: 35.3 pg — ABNORMAL HIGH (ref 26.0–34.0)
MCHC: 33.1 g/dL (ref 30.0–36.0)
MCV: 106.6 fL — AB (ref 78.0–100.0)
MONO ABS: 0.5 10*3/uL (ref 0.1–1.0)
Monocytes Relative: 4 %
NEUTROS ABS: 11.5 10*3/uL — AB (ref 1.7–7.7)
NEUTROS PCT: 88 %
Platelets: 254 10*3/uL (ref 150–400)
RBC: 3.8 MIL/uL — ABNORMAL LOW (ref 4.22–5.81)
RDW: 18.9 % — AB (ref 11.5–15.5)
WBC: 13 10*3/uL — ABNORMAL HIGH (ref 4.0–10.5)

## 2016-02-20 LAB — PHOSPHORUS: Phosphorus: 1.3 mg/dL — ABNORMAL LOW (ref 2.5–4.6)

## 2016-02-20 LAB — TSH: TSH: 0.403 u[IU]/mL (ref 0.350–4.500)

## 2016-02-20 MED ORDER — SODIUM CHLORIDE 0.9% FLUSH: 10.0000 mL | INTRAVENOUS | Status: DC | PRN

## 2016-02-20 MED ORDER — ACETAMINOPHEN 325 MG PO TABS
650.0000 mg | ORAL_TABLET | Freq: Once | ORAL | Status: AC
  Administered 2016-02-20: 650 mg via ORAL
  Filled 2016-02-20: qty 2

## 2016-02-20 MED ORDER — SODIUM CHLORIDE 0.9 % IV SOLN
Freq: Once | INTRAVENOUS | Status: AC
Start: 2016-02-20 — End: 2016-02-20
  Administered 2016-02-20: 11:00:00 via INTRAVENOUS

## 2016-02-20 MED ORDER — K PHOS MONO-SOD PHOS DI & MONO 155-852-130 MG PO TABS: 250.0000 mg | ORAL_TABLET | Freq: Three times a day (TID) | ORAL | 0 refills | Status: AC

## 2016-02-20 MED ORDER — DOCETAXEL CHEMO INJECTION 160 MG/16ML
75.0000 mg/m2 | Freq: Once | INTRAVENOUS | Status: AC
  Administered 2016-02-20: 170 mg via INTRAVENOUS
  Filled 2016-02-20: qty 17

## 2016-02-20 MED ORDER — DEXAMETHASONE SODIUM PHOSPHATE 10 MG/ML IJ SOLN
10.0000 mg | Freq: Once | INTRAMUSCULAR | Status: AC
  Administered 2016-02-20: 10 mg via INTRAVENOUS
  Filled 2016-02-20: qty 1

## 2016-02-20 MED ORDER — PREDNISONE 20 MG PO TABS: 20.0000 mg | ORAL_TABLET | Freq: Every day | ORAL | 0 refills | Status: AC

## 2016-02-20 MED ORDER — SODIUM CHLORIDE 0.9 % IV SOLN
1000.0000 mg | Freq: Once | INTRAVENOUS | Status: AC
  Administered 2016-02-20: 1000 mg via INTRAVENOUS
  Filled 2016-02-20: qty 100

## 2016-02-20 MED ORDER — PEGFILGRASTIM 6 MG/0.6ML ~~LOC~~ PSKT
6.0000 mg | PREFILLED_SYRINGE | Freq: Once | SUBCUTANEOUS | Status: AC
  Administered 2016-02-20: 6 mg via SUBCUTANEOUS
  Filled 2016-02-20: qty 0.6

## 2016-02-20 MED ORDER — HEPARIN SOD (PORK) LOCK FLUSH 100 UNIT/ML IV SOLN
500.0000 [IU] | Freq: Once | INTRAVENOUS | Status: AC | PRN
  Administered 2016-02-20: 500 [IU]
  Filled 2016-02-20 (×2): qty 5

## 2016-02-20 MED ORDER — DIPHENHYDRAMINE HCL 50 MG/ML IJ SOLN
50.0000 mg | Freq: Once | INTRAMUSCULAR | Status: AC
  Administered 2016-02-20: 50 mg via INTRAVENOUS
  Filled 2016-02-20: qty 1

## 2016-02-20 NOTE — Patient Instructions (Addendum)
[place] at [facility] Discharge Instructions  RECOMMENDATIONS MADE BY THE CONSULTANT AND ANY TEST RESULTS WILL BE SENT TO YOUR REFERRING PHYSICIAN.  Exam with Mike Craze, NP. Labs today. Start taking Prednisone 20 mg tablets 3 days after chemo (Start on Saturday, 02/23/16 and take for 3 days, then stop).   Call us with any additional bleeding from your bottom or go to the ER if it gets worse.    Thank you for choosing [place] at [facility] to provide your oncology and hematology care.  To afford each patient quality time with our provider, please arrive at least 15 minutes before your scheduled appointment time.    If you have a lab appointment with the [facility] please come in thru the  Main Entrance and check in at the main information desk  You need to re-schedule your appointment should you arrive 10 or more minutes late.  We strive to give you quality time with our providers, and arriving late affects you and other patients whose appointments are after yours.  Also, if you no show three or more times for appointments you may be dismissed from the clinic at the providers discretion.     Again, thank you for choosing [facility].  Our hope is that these requests will decrease the amount of time that you wait before being seen by our physicians.       _____________________________________________________________  Should you have questions after your visit to [facility], please contact our office at [phone] between the hours of 8:30 a.m. and 4:30 p.m.  Voicemails left after 4:30 p.m. will not be returned until the following business day.  For prescription refill requests, have your pharmacy contact our office.       Resources For Cancer Patients and their Caregivers ? American Cancer Society: Can assist with transportation, wigs, general needs, runs Look Good Feel Better.         301-847-2637 ? Cancer Care: Provides financial assistance, online support groups, medication/co-pay assistance.  1-800-813-HOPE 2360364475) ? [facility] Assists [place] cancer patients and their families through emotional , educational and financial support.  (985)572-6800 ? [place] DSS Where to apply for food stamps, Medicaid and utility assistance. 878-536-9817 ? RCATS: Transportation to medical appointments. 986-426-7726 ? Social Security Administration: May apply for disability if have a Stage IV cancer. (269)217-9056 (765)877-5314 ? [employer], Disability and Transit Services: Assists with nutrition, care and transit needs. [place] Support Programs: '@10RELATIVEDAYS'$ @ > Cancer Support Group  2nd Tuesday of the month 1pm-2pm, Journey Room  > Creative Journey  3rd Tuesday of the month 1130am-1pm, Journey Room  > Look Good Feel Better  1st Wednesday of the month 10am-12 noon, Journey Room (Call [place] [facility] to register [phone])

## 2016-02-20 NOTE — Treatment Plan (Signed)
Ok Cyramza 1000 mg per Dr Whitney Muse due to loss of weight. todays wt 97.7 kg.

## 2016-02-20 NOTE — Progress Notes (Signed)
[place]:  Medical Oncology/Hematology   PCP:  [name], MD 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 PDL-1 NEGATIVE (0% tumor proportion score); EGFR mutation NEGATIVE; no evidence of ALK gene rearrangement detected by FISH; no reaarnagement of ROS1 gene detected.      11/29/2014 PET scan    Large hypermetabolic mass within the RLL with central necrosis. Hypermetabolic right suprahilar mass with postobstructive collapse of the RUL. Subcarinal, paratracheal, prevascular and R internal mammary nodal metastases. Small R adrenal gland nodule.      12/10/2014 - 03/25/2015 Chemotherapy    Carboplatin and pemetrexed 6 cycles      02/11/2015 PET scan    Partial metabolic response to therapy, with decrease in hypermetabolic right lung masses, postobstructive right upper lobe consolidation, thoracic lymphadenopathy and right adrenal metastasis. No new or progressive disease identified.      04/05/2015 PET scan    No significant change in hypermetabolic right lung masses and thoracic lymphadenopathy. No new or progressive disease identified.      04/15/2015 - 08/01/2015 Chemotherapy    Pemetrexed maintenance      08/14/2015 PET scan    Overall progression of metastatic lung cancer. Increasingly hypermetabolic mediastinal lymph nodes and R lung masses, enlarging R adrenal mass and new osseous lesions. Enlarging hypermetabolic R-sided scalp nodules.      08/14/2015 Progression    Progression of disease on PET imaging      08/29/2015 - 10/24/2015 Chemotherapy    Opdivo      08/29/2015 Miscellaneous    Xgeva  started monthly      09/05/2015 Imaging    MRI brain- Multiple metastatic deposits in the brain. At least 9 lesions are identified. Mild edema around the 10 mm lesion in the right cerebellum.      09/27/2015 - 09/27/2015 Radiation Therapy    Dr. Lisbeth Renshaw- SRS to brain mets:  PTV target 1-10 were treated using 2 treatment plans. A single isocenter technique was utilized to treat 8 lesion simultaneously. PTV's 2 and 5 were then treated simultaneously with a second radiation treatment plan due to their close proximity. A prescription dose of 20 Gy was delivered to each lesion. ExacTrac Snap verification was performed for each couch angle.       10/31/2015 PET scan    1. Overall there has been interval progression of disease compared with the previous exam. 2. There is an increased FDG uptake associated with the dominant mass involving the right lower lobe. Additionally, there are new hypermetabolic pre-vascular and right posterior cervical lymph nodes. 3. Significant interval increase in FDG uptake associated with right upper lobe pulmonary nodule and right adrenal gland metastases. 4. New hypermetabolic metastasis is involving the left hip. Additionally, there is intense uptake associated with fractured right rib. Underlying lesion not excluded. 5. Increase in FDG uptake associated with lesion involving the posterior nasopharynx.      10/31/2015 Progression    Progression of disease      11/07/2015 Treatment Plan Change    D/C Opdivo and change to Docetaxel + Ramucirumab  11/07/2015 -  Chemotherapy    Docetaxel + Ramucirumab       01/06/2016 PET scan    1. Overall positive response to therapy with reduction in size and metabolic activity of metastatic lesions. 2. Decreased in the peripheral metabolic activity of the large RIGHT lower lobe mass. 3. Decrease in metabolic activity of metastatic mediastinal lymph nodes. 4. Decrease in metabolic activity of RIGHT adrenal gland  metastasis. 5. Decreased metabolic activity skeletal metastasis      01/15/2016 - 01/19/2016 Hospital Admission    Admit date: 01/15/2016 Admission diagnosis: Rectal bleeding Additional comments: Colitis      01/15/2016 Imaging    CT abd/pelvis- 1. Bowel wall thickening involving the sigmoid colon and to a lesser extent descending colon concerning for colitis. 2. 5.4 cm right lower lobe pulmonary mass most consistent with primary lung malignancy. 3.  Aortic Atherosclerosis        HISTORY OF PRESENT ILLNESS: Carl Simmons 56 y.o. 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 Treated by Dr. Tressie Stalker at [facility] with Carboplatin/Pemetrexed every 21 days x 6 cycles (12/10/2014- 03/25/2015) with transition to maintenance Pemetrexed every 21 days with last dose being administered on 08/01/2015. Imaging demonstrated progression of disease, resulting in a change in therapy to [place] beginning on 08/29/2015- 10/24/2015.  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 Lisbeth Renshaw. PET scan on 10/31/2015 demonstrated progression of disease resulting in a change in therapy to Docetaxel and Ramucirumab beginning on 11/07/2015.  He was hospitalized from 01/15/16-01/19/16 for rectal bleeding/colitis; was treated with IV antibiotics and bleeding resolved; transitioned to oral antibiotics with reported continued improvement in symptoms.  CT abdomen/pelvis done on 01/15/16, while hospitalized showed bowel wall thickening involving sigmoid colon.  He received Neupogen injection while hospitalized on 01/16/16.   INTERVAL HISTORY:  He is seen today in the infusion area with his wife.    His appetite is not great.  He continues to lose weight; down about 9 lbs since last visit 3 weeks ago. Last cycle of chemo done on 01/30/16. He remains on Xgeva for bone mets.   He tells me he had another episode of bloody stools about  4-5 days after his last chemo.  Bleeding lasted about 2-3 days.  "And when it hits me, I have to get to the bathroom right then or I will mess up my clothes."  He tells me that "after y'all gave me those pills that time, it helped stop the bleeding. I want something to prevent the bleeding instead of curing the bleeding."  Upon further questioning, we determined he was talking about oral steroid pills.  Denies fever/chills.  He continues to smoke cigarettes.   His energy levels are not great.  His wife expresses how much she has seen "the chemo just knock him down and make him so sick."  She becomes emotional during visit.  Getting him to report complaints is difficult given his joking personality.  His ongoing weight loss is concerning however.  REVIEW OF SYSTEMS:  Review of Systems  Constitutional: Positive for weight loss. Negative for chills and fever.       Some fatigue  HENT: Negative.  Negative for nosebleeds.   Eyes: Negative.   Respiratory: Negative.  Negative for cough.   Cardiovascular: Negative.  Negative for chest pain.  Gastrointestinal: Positive for blood in stool and diarrhea. Negative for nausea and vomiting.  Genitourinary:  Negative.   Musculoskeletal: Negative.   Skin: Negative.   Neurological: Negative.  Negative for dizziness.  Psychiatric/Behavioral: Negative.     PAST MEDICAL/SURGICAL HISTORY:  Past Medical History:  Diagnosis Date  . Adenocarcinoma of right lung ([place]) 07/11/2015  . Bone metastasis ([place]) 08/24/2015  . Cirrhosis of liver ([place]) 08/14/2015   PET on 08/14/2015 demonstrates cirrhotic liver.    Past Surgical History:  Procedure Laterality Date  . right power port placement Right    right subclavian   FAMILY HISTORY:  Family History  Problem Relation Age of Onset  . Alzheimer's disease Mother 21  . CAD Mother   . Diabetes Mother   . Cancer Father     thyroid cancer  . CVA Father 21  . Lupus Sister   . Cancer Sister     metastatic ovarian    SOCIAL HISTORY: Social History   Social History  . Marital status: Married    Spouse name: N/A  . Number of children: N/A  . Years of education: N/A   Occupational History  . disabled    Social History Main Topics  . Smoking status: Current Every Day Smoker    Packs/day: 2.00  . Smokeless tobacco: Never Used  . Alcohol use No  . Drug use: No  . Sexual activity: Not on file   Other Topics Concern  . Not on file   Social History Narrative  . No narrative on file     PHYSICAL EXAMINATION  ECOG PERFORMANCE STATUS: 1-2   Vitals:   02/20/16 0916  BP: 123/88  Pulse: (!) 122  Resp: (!) 24   Filed Weights   02/20/16 0916  Weight: 215 lb 6.4 oz (97.7 kg)     GENERAL: Alert, no distress, well nourished, well developed, comfortable, cooperative, smiling and accompanied by wife, in chemo-recliner.  Peri-orbital darkened skin. SKIN: Skin pale, no rashes or significant lesions HEAD: Normocephalic, No masses, lesions, tenderness or abnormalities.  EYES: Normal, Conjunctiva are pink and non-injected, sclerae anicteric.  EARS: External ears normal OROPHARYNX: Lips, buccal mucosa, and tongue normal and mucous membranes are moist; no mucositis.   NECK: Supple, trachea midline LYMPH:  No palpable cervical, supraclavicular, or infraclavicular lymphadenopathy LUNGS: Clear to auscultation  Decreased breath sounds bilaterally. HEART: Tachycardia, regular rhythm.  ABDOMEN: Abdomen soft, non-tender, and normal bowel sounds BACK: Back symmetric, no curvature. EXTREMITIES: Mild skin discoloration to fingers (appears to be related to tobacco use); no edema. NEURO: Alert & oriented x 3 with fluent speech, no focal motor/sensory deficits.   LABORATORY DATA: I have reviewed available lab results.  CBC    Component Value Date/Time   WBC 13.0 (H) 02/20/2016 0926   RBC 3.80 (L) 02/20/2016 0926   HGB 13.4 02/20/2016 0926   HCT 40.5 02/20/2016 0926   PLT 254 02/20/2016 0926   MCV  106.6 (H) 02/20/2016 0926   MCH 35.3 (H) 02/20/2016 0926   MCHC 33.1 02/20/2016 0926   RDW 18.9 (H) 02/20/2016 0926   LYMPHSABS 1.0 02/20/2016 0926   MONOABS 0.5 02/20/2016 0926   EOSABS 0.0 02/20/2016 0926   BASOSABS 0.0 02/20/2016 0926    CMP Latest Ref Rng & Units 02/20/2016 02/04/2016 01/30/2016  Glucose 65 - 99 mg/dL 194(H) 104(H) 172(H)  BUN 6 - 20 mg/dL _0 Creatinine 0.61 - 1.24 mg/dL 0.72 0.69 0.82  Sodium 135 - 145 mmol/L 134(L) 132(L) 137  Potassium 3.5 - 5.1 mmol/L 3.6 3.2(L) 3.5  Chloride 101 - 111 mmol/L 99(L)  97(L) 103  CO2 22 - 32 mmol/L _0 Calcium 8.9 - 10.3 mg/dL 8.2(L) 8.0(L) 9.0  Total Protein 6.5 - 8.1 g/dL 6.7 5.7(L) 6.5  Total Bilirubin 0.3 - 1.2 mg/dL 0.8 1.1 0.5  Alkaline Phos 38 - 126 U/L 66 54 57  AST 15 - 41 U/L _1 ALT 17 - 63 U/L _2 PENDING LABS:   RADIOGRAPHIC STUDIES:  No results found.   PATHOLOGY:    ASSESSMENT AND PLAN:  Mr. Sakuma is a pleasant 56 y.o. male with Stage IV adenocarcinoma of right lung with bone & brain mets; s/p [place]/Pemetrexed x 6 cycles, then maintenance Pemetrexed until 08/01/15. Then noted to have progression of disease and therapy changed to [place] from 08/29/15-10/24/15. Also underwent stereotactic radiosurgery [facility]) for brain mets on 09/27/15.  Scans again showed disease progression and therapy was changed to Docetaxel/Ramucirumab beginning on 11/07/15.   Stage IV lung cancer with brain and bone mets:  -Presents for next cycle (cycle #6) of Docetaxel/Ramicirumab today. Labs adequate for treatment. Denies peripheral neuropathy.  Restaging PET scan scheduled for 03/05/16.  Goals of care discussed at length with patient today.    Rectal bleeding:  -Recurrent colitis of unclear etiology. Pt does have f/u appointment with GI next week; strongly recommended that he keep this appointment. Given that he reports recurrent rectal bleeding 4-5 days after chemotherapy, which improved with steroids, we will give  him 3 days of Prednisone 20 mg. Instructed him to start taking the prednisone 3 days from now and take once daily for 3 days.  Hopefully, this will decrease or prevent rectal bleeding.  He was instructed to notify us or report to ED with continued rectal bleeding.  His hemoglobin is stable at 13.4 today.    Goals of care; Hospice discussion:  -Mr. Dubree continues to lose weight, which is concerning.  He has had disease progression despite several lines of therapy.  We discussed at length today his goals of care.  He understands that if this treatment does not slow progression or show disease stability, then there are no further treatment options for him.  We discussed Hospice as an option to address symptoms and quality of life at home.  He expresses concerns regarding the expenses of his cancer care.  We discussed that Hospice would help cover the costs of his medications and needs at home to help optimize his quality of life.  He states, "When y'all tell me that there's nothing else, I will be ready to go home and just kick the bucket. When y'all tell me there is no hope, then I'll stop."  We discussed that there is never a "loss of hope", but rather than a change in goals from treating the disease to treating his symptoms. He acknowledges how much his body has been through in recent months secondary to treatment.  The rectal bleeding is worrisome as well.  He is open to Hospice, depending on the results of the next restaging PET scan, scheduled for 03/05/16.     Dispo:  -Return to cancer center on 03/12/16 to follow-up after restaging PET scan.     ORDERS PLACED FOR THIS ENCOUNTER: No orders of the defined types were placed in this encounter.   MEDICATIONS PRESCRIBED THIS ENCOUNTER: Prednisone 20 mg x 3 days; begin 3 days after chemotherapy.    THERAPY PLAN:  Continue treatment as outlined above: Docetaxel + Ramucirumab; cycle #6 today, 02/20/16.  All questions were answered. The patient knows  to call the clinic with any problems, questions or concerns. We can certainly see the patient much sooner if necessary.  Patient and plan discussed with Dr. Ancil Linsey and she is in agreement with the aforementioned.   Mike Craze, NP [place] [phone]

## 2016-02-20 NOTE — Progress Notes (Signed)
Tolerated tx w/o adverse reaction.  Alert, in no distress.  VSS.  Discharged ambulatory in c/o spouse.  

## 2016-02-22 ENCOUNTER — Encounter (HOSPITAL_COMMUNITY): Payer: Self-pay | Admitting: Hematology & Oncology

## 2016-02-24 ENCOUNTER — Telehealth (HOSPITAL_COMMUNITY): Payer: Self-pay

## 2016-02-24 NOTE — Telephone Encounter (Signed)
Patients wife called stating patient had started having rectal bleeding today. She states it happens after chemo "just like clockwork". Wife states patient is very weak. He has an appointment with Dr. Bobby Rumpf concerning his rectal bleeding tomorrow. Explained to wife that she needs to talk to patient and see if he needs to come to ER for evaluation or if he thinks he can wait until his appointment with Dr. Bobby Rumpf tomorrow. Instructed wife if patient gets weaker or has more rectal bleeding than she needs to get him to the ER. Wife verbalized understanding.

## 2016-02-25 ENCOUNTER — Ambulatory Visit (INDEPENDENT_AMBULATORY_CARE_PROVIDER_SITE_OTHER): Payer: BLUE CROSS/BLUE SHIELD | Admitting: Gastroenterology

## 2016-02-25 ENCOUNTER — Encounter: Payer: Self-pay | Admitting: Gastroenterology

## 2016-02-25 VITALS — BP 107/82 | HR 113 | Temp 97.7°F | Ht 68.0 in | Wt 219.0 lb

## 2016-02-25 DIAGNOSIS — K625 Hemorrhage of anus and rectum: Secondary | ICD-10-CM | POA: Diagnosis not present

## 2016-02-25 DIAGNOSIS — K529 Noninfective gastroenteritis and colitis, unspecified: Secondary | ICD-10-CM

## 2016-02-25 NOTE — Assessment & Plan Note (Signed)
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 Hospitalized in 12/2015 with episodes, CT showed colitis involving sigmoid colon and lesser extent descending colon. NODULAR LIVER BORDER, NORMAL SPLEEN. Received antibiotic therapy at that time. Subsequently patient has noted that at 4-5 days after each dose of chemo, he has brbpr for 3 days. No associated diarrhea, abdominal or rectal pain. No prior colonoscopy. Hgb last week in the upper 12 range. I have discussed case with Dr. Gala Romney. Patient has required hospitalization for rectal bleeding, continues to have ongoing rectal bleeding intermittently. Possibly related to chemotherapy but this may be coincidental. Dr. Gala Romney suggest offering colonoscopy for further evaluation of patient's symptoms.  I have discussed the risks, alternatives, benefits with regards to but not limited to the risk of reaction to medication, bleeding, infection, perforation and the patient is agreeable to proceed. Written consent to be obtained.  If patient is agreeable, please schedule colonoscopy with Dr. Gala Romney, prior to his next chemotherapy cycle on February 15.

## 2016-02-25 NOTE — Patient Instructions (Signed)
1. I will discuss your case with our doctors and the cancer doctors and we will collectively decide the next step for the rectal bleeding.

## 2016-02-25 NOTE — Progress Notes (Signed)
Primary Care Physician:  [name], MD  Primary Gastroenterologist:  Garfield Cornea, MD   Chief Complaint  Patient presents with  . Rectal Bleeding    starts 5 days after chemo    HPI:  Carl Simmons is a 56 y.o. 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  Here for further evaluation of rectal bleeding. Admitted December 20 to the 24th for rectal bleeding/colitis. CT showed bowel wall thickening involving the sigmoid colon and to lesser extent descending colon concerning for colitis. He received IV antibiotic therapy. Received Neupogen in the hospital.   Patient states his bloody stool begins 4-5 days after chemotherapy and last for 3 days. Oral steroids seemed to help. he had steroids provided prior to his last cycle of chemo and he states the bleeding was less. He sees brbpr. Bleeds into his undergarments without control. No black or tarry stools. No constipation or diarrhea. Denies rectal or abdominal pain. States his appetite is good but he continues to loose weight. Down 35 pounds since 07/2015.  Patient's chemo changed in 10/2015 to Docetaxel and Ramucirumab. Takes chemo every 21 days. Last dose 02/20/16. PET scan 01/06/16 with overall positive response with reduction in size of metabolic activity of the metastatic lesions and the large right lower lobe mass. Plans for another PET in 02/2016.     07/2015: 250 lb 09/26/2015: 237 lb 10/24/15: 241.6 lb 12/30/15: 233 01/30/16: 224 02/20/16: 215.4  No prior colonoscopy.  Saw Dr. Anthony Sar after cancer diagnosis in 2016 as insurance company requesting screening colonoscopy but Dr. Anthony Sar did not advise given stage IV cancer diagnosis and no gi complaints.   Current Outpatient Prescriptions   Medication Sig Dispense Refill  . cyanocobalamin (,VITAMIN B-12,) 1000 MCG/ML injection Inject 1,000 mcg into the muscle every 30 (thirty) days.    . folic acid (FOLVITE) 1 MG tablet Take 1 mg by mouth daily.     . Omega-3 Fatty Acids (FISH OIL) 1000 MG CAPS Take by mouth daily.    Marland Kitchen omeprazole (PRILOSEC) 20 MG capsule Take 1 capsule (20 mg total) by mouth daily. 30 capsule 2  . phosphorus (PHOSPHA 250 NEUTRAL) [phone] MG tablet Take 1 tablet (250 mg total) by mouth 3 (three) times daily. 90 tablet 0  . potassium chloride SA (K-DUR,KLOR-CON) 20 MEQ tablet Take 1 tablet (20 mEq total) by mouth 2 (two) times daily. 60 tablet 0   No current facility-administered medications for this visit.     Allergies as of 02/25/2016  . (No Known Allergies)    Past Medical History:  Diagnosis Date  . Adenocarcinoma of right lung ([place]) 07/11/2015  . Bone metastasis ([place]) 08/24/2015  . Cirrhosis of liver ([place]) 08/14/2015   PET on 08/14/2015 demonstrates cirrhotic liver.     Past Surgical History:  Procedure Laterality Date  . right power port placement Right    right subclavian    Family History  Problem Relation Age of Onset  . Alzheimer's disease Mother 69  . CAD Mother   . Diabetes Mother   . Cancer Father     thyroid cancer  . CVA Father 31  . Lupus Sister   . Cancer Sister     metastatic ovarian  . Colon cancer Neg Hx     Social History  Social History  . Marital status: Married    Spouse name: N/A  . Number of children: N/A  . Years of education: N/A   Occupational History  . disabled    Social History Main Topics  . Smoking status: Current Every Day Smoker    Packs/day: 2.00  . Smokeless tobacco: Never Used  . Alcohol use No  . Drug use: No  . Sexual activity: Not on file   Other Topics Concern  . Not on file   Social History Narrative  . No narrative on file      ROS:  General: +weight loss, fatigue, weakness. No fever Eyes: Negative for vision  changes.  ENT: Negative for hoarseness, difficulty swallowing , nasal congestion. CV: Negative for chest pain, angina, palpitations,   peripheral edema. +DOE Respiratory: Negative for dyspnea at rest,  cough, sputum, wheezing. +DOE GI: See history of present illness. GU:  Negative for dysuria, hematuria, urinary incontinence, urinary frequency, nocturnal urination.  MS: Negative for joint pain, low back pain.  Derm: Negative for rash or itching.  Neuro: Negative for weakness, abnormal sensation, seizure, frequent headaches, memory loss, confusion.  Psych: Negative for anxiety, depression, suicidal ideation, hallucinations.  Endo: see hpi  Heme: Negative for bruising or bleeding. Allergy: Negative for rash or hives.    Physical Examination:  BP 107/82   Pulse (!) 113   Temp 97.7 F (36.5 C) (Oral)   Ht '5\' 8"'$  (1.727 m)   Wt 219 lb (99.3 kg)   BMI 33.30 kg/m    General: chronically ill-appearing wm in nad.  Head: Normocephalic, atraumatic.   Eyes: Conjunctiva pink, no icterus. Mouth: Oropharyngeal mucosa moist and pink , no lesions erythema or exudate. Neck: Supple without thyromegaly, masses, or lymphadenopathy.  Lungs: Clear to auscultation bilaterally.  Heart: Regular rate and rhythm, no murmurs rubs or gallops.  Abdomen: Bowel sounds are normal, nontender, nondistended, no hepatosplenomegaly or masses, no abdominal bruits or    hernia , no rebound or guarding.   Rectal: hemorrhoid noted externally without bleeding or thrombosis. Internal DRE with no palpable mass, nontender, bloody secretions noted.  Extremities: No lower extremity edema. No clubbing or deformities.  Neuro: Alert and oriented x 4 , grossly normal neurologically.  Skin: Warm and dry, no rash or jaundice.   Psych: Alert and cooperative, normal mood and affect.  Labs: Lab Results  Component Value Date   CREATININE 0.72 02/20/2016   BUN 11 02/20/2016   NA 134 (L) 02/20/2016   K 3.6 02/20/2016   CL 99 (L)  02/20/2016   CO2 23 02/20/2016   Lab Results  Component Value Date   ALT 26 02/20/2016   AST 25 02/20/2016   ALKPHOS 66 02/20/2016   BILITOT 0.8 02/20/2016   Lab Results  Component Value Date   WBC 13.0 (H) 02/20/2016   HGB 13.4 02/20/2016   HCT 40.5 02/20/2016   MCV 106.6 (H) 02/20/2016   PLT 254 02/20/2016   Lab Results  Component Value Date   TSH 0.403 02/20/2016     Imaging Studies: No results found.

## 2016-02-26 ENCOUNTER — Telehealth: Payer: Self-pay

## 2016-02-26 NOTE — Telephone Encounter (Signed)
-----   Message from Mahala Menghini, PA-C sent at 02/25/2016  1:40 PM EST ----- PLEASE SEE MY OV NOTE.   LET PATIENT KNOW THAT [facility] RECOMMENDS TCS TO EVALUATE RECTAL BLEEDING  PATIENT NEEDS TCS WITH [facility] PRIOR TO HIS NEXT CHEMO ON 03/12/16 IF POSSIBLE.

## 2016-02-26 NOTE — Telephone Encounter (Signed)
LMOM to call back

## 2016-02-27 ENCOUNTER — Emergency Department (HOSPITAL_COMMUNITY)
Admission: EM | Admit: 2016-02-27 | Discharge: 2016-02-27 | Disposition: A | Discharge status: Home / Self Care | Payer: BLUE CROSS/BLUE SHIELD | Attending: Emergency Medicine | Admitting: Emergency Medicine

## 2016-02-27 ENCOUNTER — Encounter (HOSPITAL_COMMUNITY): Payer: BLUE CROSS/BLUE SHIELD

## 2016-02-27 ENCOUNTER — Other Ambulatory Visit: Payer: Self-pay

## 2016-02-27 ENCOUNTER — Encounter (HOSPITAL_COMMUNITY): Payer: Self-pay | Admitting: Emergency Medicine

## 2016-02-27 ENCOUNTER — Encounter (HOSPITAL_COMMUNITY): Payer: BLUE CROSS/BLUE SHIELD | Attending: Hematology & Oncology

## 2016-02-27 VITALS — BP 51/40 | HR 118 | Temp 97.8°F | Resp 18

## 2016-02-27 DIAGNOSIS — R531 Weakness: Secondary | ICD-10-CM | POA: Insufficient documentation

## 2016-02-27 DIAGNOSIS — R0602 Shortness of breath: Secondary | ICD-10-CM

## 2016-02-27 DIAGNOSIS — J449 Chronic obstructive pulmonary disease, unspecified: Secondary | ICD-10-CM | POA: Diagnosis not present

## 2016-02-27 DIAGNOSIS — C3491 Malignant neoplasm of unspecified part of right bronchus or lung: Secondary | ICD-10-CM

## 2016-02-27 DIAGNOSIS — R000 Tachycardia, unspecified: Secondary | ICD-10-CM

## 2016-02-27 DIAGNOSIS — C7951 Secondary malignant neoplasm of bone: Secondary | ICD-10-CM

## 2016-02-27 DIAGNOSIS — R195 Other fecal abnormalities: Secondary | ICD-10-CM | POA: Insufficient documentation

## 2016-02-27 DIAGNOSIS — C7931 Secondary malignant neoplasm of brain: Secondary | ICD-10-CM

## 2016-02-27 DIAGNOSIS — F17200 Nicotine dependence, unspecified, uncomplicated: Secondary | ICD-10-CM | POA: Insufficient documentation

## 2016-02-27 DIAGNOSIS — Z79899 Other long term (current) drug therapy: Secondary | ICD-10-CM | POA: Insufficient documentation

## 2016-02-27 DIAGNOSIS — K625 Hemorrhage of anus and rectum: Secondary | ICD-10-CM

## 2016-02-27 DIAGNOSIS — Z85841 Personal history of malignant neoplasm of brain: Secondary | ICD-10-CM | POA: Diagnosis not present

## 2016-02-27 LAB — VITAMIN B12: Vitamin B-12: 1485 pg/mL — ABNORMAL HIGH (ref 180–914)

## 2016-02-27 LAB — COMPREHENSIVE METABOLIC PANEL
ALT: 23 U/L (ref 17–63)
AST: 27 U/L (ref 15–41)
Albumin: 3.1 g/dL — ABNORMAL LOW (ref 3.5–5.0)
Alkaline Phosphatase: 62 U/L (ref 38–126)
Anion gap: 14 (ref 5–15)
BUN: 17 mg/dL (ref 6–20)
CHLORIDE: 97 mmol/L — AB (ref 101–111)
CO2: 21 mmol/L — ABNORMAL LOW (ref 22–32)
Calcium: 8.4 mg/dL — ABNORMAL LOW (ref 8.9–10.3)
Creatinine, Ser: 0.87 mg/dL (ref 0.61–1.24)
Glucose, Bld: 92 mg/dL (ref 65–99)
POTASSIUM: 3.6 mmol/L (ref 3.5–5.1)
Sodium: 132 mmol/L — ABNORMAL LOW (ref 135–145)
Total Bilirubin: 1 mg/dL (ref 0.3–1.2)
Total Protein: 5.8 g/dL — ABNORMAL LOW (ref 6.5–8.1)

## 2016-02-27 LAB — CBC WITH DIFFERENTIAL/PLATELET
BASOS ABS: 0.1 10*3/uL (ref 0.0–0.1)
BASOS PCT: 1 %
EOS ABS: 0 10*3/uL (ref 0.0–0.7)
Eosinophils Relative: 0 %
HCT: 38 % — ABNORMAL LOW (ref 39.0–52.0)
Hemoglobin: 12.7 g/dL — ABNORMAL LOW (ref 13.0–17.0)
Lymphocytes Relative: 36 %
Lymphs Abs: 3.5 10*3/uL (ref 0.7–4.0)
MCH: 35.4 pg — AB (ref 26.0–34.0)
MCHC: 33.4 g/dL (ref 30.0–36.0)
MCV: 105.8 fL — AB (ref 78.0–100.0)
Monocytes Absolute: 0.5 10*3/uL (ref 0.1–1.0)
Monocytes Relative: 5 %
NEUTROS PCT: 58 %
Neutro Abs: 5.5 10*3/uL (ref 1.7–7.7)
PLATELETS: 113 10*3/uL — AB (ref 150–400)
RBC: 3.59 MIL/uL — ABNORMAL LOW (ref 4.22–5.81)
RDW: 16.9 % — AB (ref 11.5–15.5)
WBC: 9.6 10*3/uL (ref 4.0–10.5)

## 2016-02-27 LAB — PHOSPHORUS: PHOSPHORUS: 2 mg/dL — AB (ref 2.5–4.6)

## 2016-02-27 LAB — FOLATE: Folate: 19.9 ng/mL (ref >5.9)

## 2016-02-27 MED ORDER — SODIUM CHLORIDE 0.9 % IV BOLUS (SEPSIS)
1000.0000 mL | Freq: Once | INTRAVENOUS | Status: AC
  Administered 2016-02-27: 1000 mL via INTRAVENOUS

## 2016-02-27 MED ORDER — PEG 3350-KCL-NA BICARB-NACL 420 G PO SOLR: 4000.0000 mL | ORAL | 0 refills | Status: AC

## 2016-02-27 MED ORDER — DENOSUMAB 120 MG/1.7ML ~~LOC~~ SOLN
120.0000 mg | Freq: Once | SUBCUTANEOUS | Status: DC
  Filled 2016-02-27: qty 1.7

## 2016-02-27 MED ORDER — HEPARIN SOD (PORK) LOCK FLUSH 100 UNIT/ML IV SOLN
INTRAVENOUS | Status: AC
  Administered 2016-02-27: 15:00:00
  Filled 2016-02-27: qty 5

## 2016-02-27 NOTE — Discharge Instructions (Signed)
Drink plenty of fluids. Return to the hospital if the bleeding does not stop. Follow-up with your doctors next week   f

## 2016-02-27 NOTE — Telephone Encounter (Signed)
Pt is set up for TCS/PROPOFOL on 03/09/16 @ 1:00. His wife is aware and instructions will be going out in the mail

## 2016-02-27 NOTE — ED Provider Notes (Signed)
[facility] DEPT Provider Note   CSN: [id number] Arrival date & time: 02/27/16  1125   By signing my name below, I, Carl Simmons, attest that this documentation has been prepared under the direction and in the presence of Carl Ferguson, MD. Electronically Signed: Hilbert Simmons, Scribe. 02/27/16. 12:42 PM. History   Chief Complaint Chief Complaint  Patient presents with  . Weakness   HPI Comments: Carl Simmons is a 56 y.o. male brought in by ambulance, who presents to the Emergency Department complaining of increased weakness and SOB since Tuesday. The patient states that he has terminal lung cancer. He reports that his last chemo treatment was 02/20/2016. He also reports rectal bleeding which he states typically occurs around 5 days after his chemo and lasts for around 2 to 3 days.  Patient was seen in oncology today but his blood pressure was low. Patient states that he usually has rectal bleeding 5 days after his chemotherapy and a last 3 days. This is the third time in a row this is happened. Patient was seen in the oncology clinic his hemoglobin was 13.   The history is provided by the patient and a relative. No language interpreter was used.  Weakness  Primary symptoms include no speech change. This is a new problem. The current episode started 2 days ago. There was no focality noted. There has been no fever. Associated symptoms include shortness of breath. Pertinent negatives include no chest pain and no headaches. Associated medical issues comments: Adenocarcinoma of Right lung.     Past Medical History:  Diagnosis Date  . Adenocarcinoma of right lung ([place]) 07/11/2015  . Bone metastasis ([place]) 08/24/2015  . Cirrhosis of liver ([place]) 08/14/2015   PET on 08/14/2015 demonstrates cirrhotic liver.     Patient Active Problem List   Diagnosis Date Noted  . Rectal bleeding 02/25/2016  . Palliative care encounter   . Goals of care, counseling/discussion   . Colitis  01/15/2016  . Neutropenia ([place]) 01/15/2016  . Elevated lactic acid level 01/15/2016  . Moderate malnutrition ([place]) 01/15/2016  . Hyperglycemia 01/15/2016  . Brain metastases ([place]) 09/18/2015  . Bone metastasis ([place]) 08/24/2015  . Cirrhosis of liver ([place]) 08/14/2015  . Adenocarcinoma of right lung ([place]) 07/11/2015  . COPD (chronic obstructive pulmonary disease) ([place]) 05/21/2015  . Morbid obesity due to excess calories ([place]) 05/21/2015  . Chemotherapy management, encounter for 02/12/2015  . History of broken leg 11/19/2014  . Smoking greater than 40 pack years 11/19/2014  . Weight loss, unintentional 11/19/2014    Past Surgical History:  Procedure Laterality Date  . right power port placement Right    right subclavian       Home Medications    Prior to Admission medications   Medication Sig Start Date End Date Taking? Authorizing Provider  cyanocobalamin (,VITAMIN B-12,) 1000 MCG/ML injection Inject 1,000 mcg into the muscle every 30 (thirty) days.    Historical Provider, MD  folic acid (FOLVITE) 1 MG tablet Take 1 mg by mouth daily.  04/15/15   Historical Provider, MD  Omega-3 Fatty Acids (FISH OIL) 1000 MG CAPS Take by mouth daily.    Historical Provider, MD  omeprazole (PRILOSEC) 20 MG capsule Take 1 capsule (20 mg total) by mouth daily. 01/09/16   Baird Cancer, PA-C  phosphorus (PHOSPHA 250 NEUTRAL) [phone] MG tablet Take 1 tablet (250 mg total) by mouth 3 (three) times daily. 02/20/16   Patrici Ranks, MD  potassium chloride SA (K-DUR,KLOR-CON) 20 MEQ tablet  Take 1 tablet (20 mEq total) by mouth 2 (two) times daily. 09/13/15   Baird Cancer, PA-C    Family History Family History  Problem Relation Age of Onset  . Alzheimer's disease Mother 23  . CAD Mother   . Diabetes Mother   . Cancer Father     thyroid cancer  . CVA Father 62  . Lupus Sister   . Cancer Sister     metastatic ovarian  . Colon cancer Neg Hx     Social History Social History    Substance Use Topics  . Smoking status: Current Every Day Smoker    Packs/day: 2.00  . Smokeless tobacco: Never Used  . Alcohol use No     Allergies   Patient has no known allergies.   Review of Systems Review of Systems  Constitutional: Negative for appetite change and fatigue.  HENT: Negative for congestion, ear discharge and sinus pressure.   Eyes: Negative for discharge.  Respiratory: Positive for shortness of breath. Negative for cough.   Cardiovascular: Negative for chest pain.  Gastrointestinal: Positive for blood in stool. Negative for abdominal pain and diarrhea.  Genitourinary: Negative for frequency and hematuria.  Musculoskeletal: Negative for back pain.  Skin: Negative for rash.  Neurological: Positive for weakness. Negative for speech change, seizures and headaches.  Psychiatric/Behavioral: Negative for hallucinations.  All other systems reviewed and are negative.    Physical Exam Updated Vital Signs BP 100/85   Pulse 105   Temp 98.3 F (36.8 C) (Oral)   Resp 23   Ht '5\' 8"'$  (1.727 m)   Wt 220 lb (99.8 kg)   SpO2 99%   BMI 33.45 kg/m   Physical Exam  Constitutional: He is oriented to person, place, and time. He appears well-developed.  HENT:  Head: Normocephalic.  Eyes: Conjunctivae and EOM are normal. No scleral icterus.  Neck: Neck supple. No thyromegaly present.  Cardiovascular: Normal rate and regular rhythm.  Exam reveals no gallop and no friction rub.   No murmur heard. Pulmonary/Chest: No stridor. He has no wheezes. He has no rales. He exhibits no tenderness.  Abdominal: He exhibits no distension. There is no tenderness. There is no rebound.  Musculoskeletal: Normal range of motion. He exhibits no edema.  Lymphadenopathy:    He has no cervical adenopathy.  Neurological: He is oriented to person, place, and time. He exhibits normal muscle tone. Coordination normal.  Skin: No rash noted. No erythema.  Psychiatric: He has a normal mood and  affect. His behavior is normal.     ED Treatments / Results  DIAGNOSTIC STUDIES: Oxygen Saturation is 99% on RA, normal by my interpretation.    COORDINATION OF CARE: 12:21 PM Discussed treatment plan with pt at bedside, which includes labs, and pt agreed to plan. I will also give the patient IV fluids.  Labs (all labs ordered are listed, but only abnormal results are displayed) Labs Reviewed - No data to display  EKG  EKG Interpretation None       Radiology No results found.  Procedures Procedures (including critical care time)  Medications Ordered in ED Medications - No data to display   Initial Impression / Assessment and Plan / ED Course  I have reviewed the triage vital signs and the nursing notes.  Pertinent labs & imaging results that were available during my care of the patient were reviewed by me and considered in my medical decision making (see chart for details).     Patient with  rectal bleeding and weakness after chemotherapy. Labs unremarkable. Patient stable. Patient states he feels better with IV fluids. He will be discharged home for follow-up with his doctor  Final Clinical Impressions(s) / ED Diagnoses   Final diagnoses:  None    New Prescriptions New Prescriptions   No medications on file   The chart was scribed for me under my direct supervision.  I personally performed the history, physical, and medical decision making and all procedures in the evaluation of this patient.Carl Ferguson, MD 02/27/16 [phone]

## 2016-02-27 NOTE — Progress Notes (Signed)
RN asked that I step into patient's room to evaluate him d/t low blood pressure, shortness of breath, & tachycardia.   Upon my evaluation, patient seen seated in wheelchair with tachypnea, BP 50s/40s, HR 110s, & pale.  O2 sats 100% on RA.  Patient appears to be in distress d/t shortness of breath.  He often leans over in the chair in tripod position.   Pt denies any new medications today. Denies chest pain, jaw pain, or shoulder pain.  Denies any recent fever/chills.  Reports he has had more rectal bleeding since his last chemo; reports he took Prednisone ordered at last chemo appt.   I performed manual BP: 88/66.   Physical exam:  -Cool skin, clammy -Irregular, irratic heart rate and rhythm.  Stat EKG ordered.  -Tachypneic breathing with diminished breath sounds.  -Rapid, irregular radial pulses.  -Leaning over in tripod positioning.  -He is alert and oriented, but in distress given shortness of breath.    Actions:  -Stat EKG ordered (not done before patient sent to ED)  -O2 ordered via [place] at 2L for comfort.  -Patient transported to ED for further evaluation via wheelchair.    Mike Craze, NP [place] [phone]

## 2016-02-27 NOTE — Progress Notes (Signed)
CC'ED TO PCP 

## 2016-02-27 NOTE — ED Notes (Signed)
Pt states he has nasal congestion that causes him to feel SOB, denies chest pressure or pain. Reports this is normal sx for him after receiving chemo. States he usually has weakness 5 days after chemo and 2 days of rectal bleeding.

## 2016-02-27 NOTE — ED Notes (Signed)
Pt speaking loudly on the phone, heard outside of room.

## 2016-02-27 NOTE — Progress Notes (Signed)
Corrected calcium 9.1 per pharmacist.  Patient presents for xgeva today. Patient is SOB, states he feels really bad, having trouble walking, cant' stand, feels extremely weak, see vitals in flowsheet. Mike Craze NP advised to send patient to the ER. Patient transported in wheelchair with oxygen on 2 liters to the ER with family member present. ER charge nurse called and was given report.

## 2016-02-27 NOTE — ED Triage Notes (Signed)
Pt sent from Cancer center for increased weakness/sob since Tuesday. Pt also reports bright red rectal bleeding with bowel movements since Tuesday, but states this is normal after Chemo for him. Last chemo treatment last Thursday. nad noted. O2 saturation 99% on RA. Pt speaking in complete sentences.

## 2016-02-28 ENCOUNTER — Encounter (HOSPITAL_COMMUNITY): Payer: BLUE CROSS/BLUE SHIELD

## 2016-03-04 ENCOUNTER — Inpatient Hospital Stay (HOSPITAL_COMMUNITY): Admission: RE | Admit: 2016-03-04 | Payer: BLUE CROSS/BLUE SHIELD | Source: Ambulatory Visit

## 2016-03-04 ENCOUNTER — Telehealth (HOSPITAL_COMMUNITY): Payer: Self-pay | Admitting: Oncology

## 2016-03-04 ENCOUNTER — Encounter (HOSPITAL_COMMUNITY)
Admission: RE | Admit: 2016-03-04 | Discharge: 2016-03-04 | Disposition: A | Discharge status: Home / Self Care | Payer: BLUE CROSS/BLUE SHIELD | Source: Ambulatory Visit | Attending: Internal Medicine | Admitting: Internal Medicine

## 2016-03-04 ENCOUNTER — Encounter (HOSPITAL_COMMUNITY): Payer: Self-pay

## 2016-03-04 NOTE — Telephone Encounter (Signed)
Peer to peer for PET scan approved.  Reviewer was notified of his PET scan every 3 months since 2016 and his recurrence of disease has typically included subcutaneous mets.  Approval #: [id number].  Robynn Pane, PA-C 03/04/2016 4:28 PM

## 2016-03-05 ENCOUNTER — Encounter (HOSPITAL_COMMUNITY): Payer: BLUE CROSS/BLUE SHIELD

## 2016-03-05 ENCOUNTER — Other Ambulatory Visit (HOSPITAL_COMMUNITY): Payer: Self-pay | Admitting: Oncology

## 2016-03-09 ENCOUNTER — Encounter (HOSPITAL_COMMUNITY): Payer: Self-pay | Admitting: *Deleted

## 2016-03-09 ENCOUNTER — Ambulatory Visit (HOSPITAL_COMMUNITY): Payer: BLUE CROSS/BLUE SHIELD | Admitting: Anesthesiology

## 2016-03-09 ENCOUNTER — Ambulatory Visit (HOSPITAL_COMMUNITY)
Admission: RE | Admit: 2016-03-09 | Discharge: 2016-03-09 | Disposition: A | Discharge status: Home / Self Care | Payer: BLUE CROSS/BLUE SHIELD | Source: Ambulatory Visit | Attending: Internal Medicine | Admitting: Internal Medicine

## 2016-03-09 ENCOUNTER — Encounter (HOSPITAL_COMMUNITY)
Admission: RE | Disposition: A | Discharge status: Home / Self Care | Payer: Self-pay | Source: Ambulatory Visit | Attending: Internal Medicine

## 2016-03-09 DIAGNOSIS — K648 Other hemorrhoids: Secondary | ICD-10-CM | POA: Insufficient documentation

## 2016-03-09 DIAGNOSIS — K529 Noninfective gastroenteritis and colitis, unspecified: Secondary | ICD-10-CM | POA: Diagnosis not present

## 2016-03-09 DIAGNOSIS — Z9221 Personal history of antineoplastic chemotherapy: Secondary | ICD-10-CM | POA: Insufficient documentation

## 2016-03-09 DIAGNOSIS — K6389 Other specified diseases of intestine: Secondary | ICD-10-CM | POA: Diagnosis not present

## 2016-03-09 DIAGNOSIS — J449 Chronic obstructive pulmonary disease, unspecified: Secondary | ICD-10-CM | POA: Insufficient documentation

## 2016-03-09 DIAGNOSIS — F17210 Nicotine dependence, cigarettes, uncomplicated: Secondary | ICD-10-CM | POA: Insufficient documentation

## 2016-03-09 DIAGNOSIS — Z6833 Body mass index (BMI) 33.0-33.9, adult: Secondary | ICD-10-CM | POA: Diagnosis not present

## 2016-03-09 DIAGNOSIS — K7460 Unspecified cirrhosis of liver: Secondary | ICD-10-CM | POA: Insufficient documentation

## 2016-03-09 DIAGNOSIS — K625 Hemorrhage of anus and rectum: Secondary | ICD-10-CM

## 2016-03-09 DIAGNOSIS — D123 Benign neoplasm of transverse colon: Secondary | ICD-10-CM | POA: Diagnosis not present

## 2016-03-09 DIAGNOSIS — D1779 Benign lipomatous neoplasm of other sites: Secondary | ICD-10-CM | POA: Insufficient documentation

## 2016-03-09 DIAGNOSIS — C7951 Secondary malignant neoplasm of bone: Secondary | ICD-10-CM | POA: Diagnosis not present

## 2016-03-09 DIAGNOSIS — D124 Benign neoplasm of descending colon: Secondary | ICD-10-CM | POA: Insufficient documentation

## 2016-03-09 DIAGNOSIS — C7931 Secondary malignant neoplasm of brain: Secondary | ICD-10-CM | POA: Insufficient documentation

## 2016-03-09 DIAGNOSIS — Z85118 Personal history of other malignant neoplasm of bronchus and lung: Secondary | ICD-10-CM | POA: Diagnosis not present

## 2016-03-09 DIAGNOSIS — K921 Melena: Secondary | ICD-10-CM | POA: Insufficient documentation

## 2016-03-09 HISTORY — PX: COLONOSCOPY WITH PROPOFOL: SHX5780

## 2016-03-09 HISTORY — PX: POLYPECTOMY: SHX5525

## 2016-03-09 HISTORY — PX: BIOPSY: SHX5522

## 2016-03-09 SURGERY — COLONOSCOPY WITH PROPOFOL
Anesthesia: Monitor Anesthesia Care

## 2016-03-09 MED ORDER — MIDAZOLAM HCL 2 MG/2ML IJ SOLN
1.0000 mg | INTRAMUSCULAR | Status: AC
  Administered 2016-03-09 (×2): 1 mg via INTRAVENOUS

## 2016-03-09 MED ORDER — MIDAZOLAM HCL 5 MG/5ML IJ SOLN
INTRAMUSCULAR | Status: DC | PRN
  Administered 2016-03-09: 1 mg via INTRAVENOUS

## 2016-03-09 MED ORDER — MIDAZOLAM HCL 2 MG/2ML IJ SOLN
INTRAMUSCULAR | Status: AC
  Filled 2016-03-09: qty 2

## 2016-03-09 MED ORDER — PROPOFOL 10 MG/ML IV BOLUS
INTRAVENOUS | Status: DC | PRN
  Administered 2016-03-09: 10 mg via INTRAVENOUS

## 2016-03-09 MED ORDER — CHLORHEXIDINE GLUCONATE CLOTH 2 % EX PADS: 6.0000 | MEDICATED_PAD | Freq: Once | CUTANEOUS | Status: DC

## 2016-03-09 MED ORDER — PROPOFOL 10 MG/ML IV BOLUS
INTRAVENOUS | Status: AC
  Filled 2016-03-09: qty 20

## 2016-03-09 MED ORDER — FENTANYL CITRATE (PF) 100 MCG/2ML IJ SOLN
25.0000 ug | INTRAMUSCULAR | Status: AC
  Administered 2016-03-09: 25 ug via INTRAVENOUS

## 2016-03-09 MED ORDER — LACTATED RINGERS IV SOLN
INTRAVENOUS | Status: DC
  Administered 2016-03-09: 09:00:00 via INTRAVENOUS

## 2016-03-09 MED ORDER — FENTANYL CITRATE (PF) 100 MCG/2ML IJ SOLN
INTRAMUSCULAR | Status: AC
  Filled 2016-03-09: qty 2

## 2016-03-09 MED ORDER — PROPOFOL 500 MG/50ML IV EMUL
INTRAVENOUS | Status: DC | PRN
  Administered 2016-03-09: 100 ug/kg/min via INTRAVENOUS
  Administered 2016-03-09: 75 ug/kg/min via INTRAVENOUS

## 2016-03-09 NOTE — Op Note (Addendum)
[facility] Patient Name: Carl Simmons Procedure Date: 03/09/2016 8:36 AM MRN: [id number] Date of Birth: 07/16/1960 Attending MD: Norvel Richards , MD CSN: [id number] Age: 56 Admit Type: Outpatient Procedure:                Colonoscopy - diagnostic Indications:              Hematochezia Providers:                Norvel Richards, MD, Lurline Del, RN, Purcell Nails.                            [place], [employer] officer Referring MD:              Medicines:                 Complications:            No immediate complications. Estimated Blood Loss:     Estimated blood loss: none. Procedure:                - Prior to the procedure, a History and Physical                            was performed, and patient medications and                            allergies were reviewed. The patient's tolerance of                            previous anesthesia was also reviewed. The risks                            and benefits of the procedure and the sedation                            options and risks were discussed with the patient.                            All questions were answered, and informed consent                            was obtained. Prior Anticoagulants: The patient has                            taken no previous anticoagulant or antiplatelet                            agents. ASA Grade Assessment: IV - A patient with                            severe systemic disease that is a constant threat                            to life. After reviewing the risks and benefits,  the patient was deemed in satisfactory condition to                            undergo the procedure. The EC-[id number] ([id number])                            scope was introduced through the anus and advanced                            to the the cecum, identified by appendiceal orifice                            and ileocecal valve. Scope In: 9:26:10 AM Scope Out: 9:54:37 AM Scope Withdrawal  Time: 0 hours 25 minutes 24 seconds  Total Procedure Duration: 0 hours 28 minutes 27 seconds  Findings:      hemorrhoids were found during retroflexion. Internal he hemorrhoids were       moderate and Grade I (internal hemorrhoids that do not prolapse).      The perianal and digital rectal examinations were normal otherwise.      A 15 mm polyp was found in the descending colon. The polyp was       pedunculated. Hemostasis clip on the polyp stalk. The polyp was removed       with a hot snare. Resection and retrieval were complete. Estimated blood       loss: none.      Two semi-pedunculated polyps were found in the splenic flexure. The       polyps were 4 to 6 mm in size. These polyps were removed with a cold       snare. Resection and retrieval were complete. Estimated blood loss was       minimal.      A scattered area of granular mucosa was found in the sigmoid colon. This       was biopsied with a cold forceps for histology. Estimated blood loss was       minimal. lipoma and ascending colon?2 cm.      No additional abnormalities were found on retroflexion. Impression:               - Internal hemorrhoids. lipoma ascending colon                           - One 15 mm polyp in the descending colon, removed                            with a hot snare. Resected and retrieved.                            hemostasis clip placed                           - Two 4 to 6 mm polyps at the splenic flexure,                            removed with a cold snare. Resected and retrieved.                           -  Granularity in the sigmoid colon and rectum.                            Biopsied. May have mild proctocolitis ( MILD).                            Cannot exclude bleeding from hemorrhoids or even                            the large left colon polyp removed today. Moderate Sedation:      Moderate (conscious) sedation was personally administered by an       anesthesia professional. The  following parameters were monitored: oxygen       saturation, heart rate, blood pressure, respiratory rate, EKG, adequacy       of pulmonary ventilation, and response to care. Total physician       intraservice time was 39 minutes. Recommendation:           - Patient has a contact number available for                            emergencies. The signs and symptoms of potential                            delayed complications were discussed with the                            patient. Return to normal activities tomorrow.                            Written discharge instructions were provided to the                            patient.                           - Resume previous diet.                           - Continue present medications. Follow-up pathology.                           - No repeat colonoscopy.                           - Return to GI clinic in 6 weeks. No future MI                            until clips gone Procedure Code(s):        --- Professional ---                           [phone], Colonoscopy, flexible; with removal of                            tumor(s), polyp(s), or other lesion(s) by snare  technique                           45380, 59, Colonoscopy, flexible; with biopsy,                            single or multiple Diagnosis Code(s):        --- Professional ---                           D12.4, Benign neoplasm of descending colon                           D12.3, Benign neoplasm of transverse colon (hepatic                            flexure or splenic flexure)                           K63.89, Other specified diseases of intestine                           K64.0, First degree hemorrhoids                           K92.1, Melena (includes Hematochezia) CPT copyright 2016 American Medical Association. All rights reserved. The codes documented in this report are preliminary and upon coder review may  be revised to meet current compliance  requirements. Cristopher Estimable. Caleen Taaffe, MD Norvel Richards, MD 03/09/2016 10:08:13 AM This report has been signed electronically. Number of Addenda: 0

## 2016-03-09 NOTE — Interval H&P Note (Signed)
History and Physical Interval Note:  03/09/2016 9:22 AM  Paschal Dopp  has presented today for surgery, with the diagnosis of RECTAL BLEEDING  The various methods of treatment have been discussed with the patient and family. After consideration of risks, benefits and other options for treatment, the patient has consented to  Procedure(s) with comments: COLONOSCOPY WITH PROPOFOL (N/A) - 100 - pt knows to arrive at 7:45 as a surgical intervention .  The patient's history has been reviewed, patient examined, no change in status, stable for surgery.  I have reviewed the patient's chart and labs.  Questions were answered to the patient's satisfaction.     Lonnetta Kniskern  No change. Discuss with Alfonso Patten, in oncology just this morning. They very much need this procedure done to determine whether or not chemotherapy is causing rectal bleeding. Patient has never had a colonoscopy. Discharge has been reviewed with patient and patient's wife at length this morning.  The risks, benefits, limitations, alternatives and imponderables have been reviewed with the patient. Questions have been answered. All parties are agreeable.

## 2016-03-09 NOTE — Anesthesia Procedure Notes (Signed)
Procedure Name: MAC Date/Time: 03/09/2016 9:14 AM Performed by: Vista Deck Pre-anesthesia Checklist: Patient identified, Emergency Drugs available, Suction available, Timeout performed and Patient being monitored Patient Re-evaluated:Patient Re-evaluated prior to inductionOxygen Delivery Method: Non-rebreather mask

## 2016-03-09 NOTE — Anesthesia Postprocedure Evaluation (Signed)
Anesthesia Post Note  Patient: Carl Simmons  Procedure(s) Performed: Procedure(s) (LRB): COLONOSCOPY WITH PROPOFOL (N/A) POLYPECTOMY BIOPSY  Patient location during evaluation: PACU Anesthesia Type: MAC Level of consciousness: awake Pain management: satisfactory to patient Vital Signs Assessment: post-procedure vital signs reviewed and stable Respiratory status: spontaneous breathing and non-rebreather facemask Cardiovascular status: stable Anesthetic complications: no     Last Vitals:  Vitals:   03/09/16 0905 03/09/16 0910  BP: (!) 84/60 (!) 88/62  Pulse:    Resp: 17 16  Temp:      Last Pain:  Vitals:   03/09/16 0840  TempSrc: Oral                 Zainab Crumrine

## 2016-03-09 NOTE — Transfer of Care (Signed)
Immediate Anesthesia Transfer of Care Note  Patient: Carl Simmons  Procedure(s) Performed: Procedure(s) with comments: COLONOSCOPY WITH PROPOFOL (N/A) - 100 - pt knows to arrive at 7:45 POLYPECTOMY - at splenic flexure x2; distal descending colon BIOPSY - sigmoid and rectal biopsies  Patient Location: PACU  Anesthesia Type:MAC  Level of Consciousness: sedated and patient cooperative  Airway & Oxygen Therapy: Patient Spontanous Breathing and non-rebreather face mask  Post-op Assessment: Report given to RN and Post -op Vital signs reviewed and stable  Post vital signs: Reviewed  Last Vitals:  Vitals:   03/09/16 0905 03/09/16 0910  BP: (!) 84/60 (!) 88/62  Pulse:    Resp: 17 16  Temp:      Last Pain:  Vitals:   03/09/16 0840  TempSrc: Oral      Patients Stated Pain Goal: 6 (57/47/34 0370)  Complications: No apparent anesthesia complications

## 2016-03-09 NOTE — H&P (View-Only) (Signed)
Primary Care Physician:  [name], MD  Primary Gastroenterologist:  Garfield Cornea, MD   Chief Complaint  Patient presents with  . Rectal Bleeding    starts 5 days after chemo    HPI:  Carl Simmons is a 56 y.o. 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  Here for further evaluation of rectal bleeding. Admitted December 20 to the 24th for rectal bleeding/colitis. CT showed bowel wall thickening involving the sigmoid colon and to lesser extent descending colon concerning for colitis. He received IV antibiotic therapy. Received Neupogen in the hospital.   Patient states his bloody stool begins 4-5 days after chemotherapy and last for 3 days. Oral steroids seemed to help. he had steroids provided prior to his last cycle of chemo and he states the bleeding was less. He sees brbpr. Bleeds into his undergarments without control. No black or tarry stools. No constipation or diarrhea. Denies rectal or abdominal pain. States his appetite is good but he continues to loose weight. Down 35 pounds since 07/2015.  Patient's chemo changed in 10/2015 to Docetaxel and Ramucirumab. Takes chemo every 21 days. Last dose 02/20/16. PET scan 01/06/16 with overall positive response with reduction in size of metabolic activity of the metastatic lesions and the large right lower lobe mass. Plans for another PET in 02/2016.     07/2015: 250 lb 09/26/2015: 237 lb 10/24/15: 241.6 lb 12/30/15: 233 01/30/16: 224 02/20/16: 215.4  No prior colonoscopy.  Saw Dr. Anthony Sar after cancer diagnosis in 2016 as insurance company requesting screening colonoscopy but Dr. Anthony Sar did not advise given stage IV cancer diagnosis and no gi complaints.   Current Outpatient Prescriptions   Medication Sig Dispense Refill  . cyanocobalamin (,VITAMIN B-12,) 1000 MCG/ML injection Inject 1,000 mcg into the muscle every 30 (thirty) days.    . folic acid (FOLVITE) 1 MG tablet Take 1 mg by mouth daily.     . Omega-3 Fatty Acids (FISH OIL) 1000 MG CAPS Take by mouth daily.    Marland Kitchen omeprazole (PRILOSEC) 20 MG capsule Take 1 capsule (20 mg total) by mouth daily. 30 capsule 2  . phosphorus (PHOSPHA 250 NEUTRAL) 155-852-130 MG tablet Take 1 tablet (250 mg total) by mouth 3 (three) times daily. 90 tablet 0  . potassium chloride SA (K-DUR,KLOR-CON) 20 MEQ tablet Take 1 tablet (20 mEq total) by mouth 2 (two) times daily. 60 tablet 0   No current facility-administered medications for this visit.     Allergies as of 02/25/2016  . (No Known Allergies)    Past Medical History:  Diagnosis Date  . Adenocarcinoma of right lung ([place]) 07/11/2015  . Bone metastasis ([place]) 08/24/2015  . Cirrhosis of liver ([place]) 08/14/2015   PET on 08/14/2015 demonstrates cirrhotic liver.     Past Surgical History:  Procedure Laterality Date  . right power port placement Right    right subclavian    Family History  Problem Relation Age of Onset  . Alzheimer's disease Mother 27  . CAD Mother   . Diabetes Mother   . Cancer Father     thyroid cancer  . CVA Father 26  . Lupus Sister   . Cancer Sister     metastatic ovarian  . Colon cancer Neg Hx     Social History  Social History  . Marital status: Married    Spouse name: N/A  . Number of children: N/A  . Years of education: N/A   Occupational History  . disabled    Social History Main Topics  . Smoking status: Current Every Day Smoker    Packs/day: 2.00  . Smokeless tobacco: Never Used  . Alcohol use No  . Drug use: No  . Sexual activity: Not on file   Other Topics Concern  . Not on file   Social History Narrative  . No narrative on file      ROS:  General: +weight loss, fatigue, weakness. No fever Eyes: Negative for vision  changes.  ENT: Negative for hoarseness, difficulty swallowing , nasal congestion. CV: Negative for chest pain, angina, palpitations,   peripheral edema. +DOE Respiratory: Negative for dyspnea at rest,  cough, sputum, wheezing. +DOE GI: See history of present illness. GU:  Negative for dysuria, hematuria, urinary incontinence, urinary frequency, nocturnal urination.  MS: Negative for joint pain, low back pain.  Derm: Negative for rash or itching.  Neuro: Negative for weakness, abnormal sensation, seizure, frequent headaches, memory loss, confusion.  Psych: Negative for anxiety, depression, suicidal ideation, hallucinations.  Endo: see hpi  Heme: Negative for bruising or bleeding. Allergy: Negative for rash or hives.    Physical Examination:  BP 107/82   Pulse (!) 113   Temp 97.7 F (36.5 C) (Oral)   Ht '5\' 8"'$  (1.727 m)   Wt 219 lb (99.3 kg)   BMI 33.30 kg/m    General: chronically ill-appearing wm in nad.  Head: Normocephalic, atraumatic.   Eyes: Conjunctiva pink, no icterus. Mouth: Oropharyngeal mucosa moist and pink , no lesions erythema or exudate. Neck: Supple without thyromegaly, masses, or lymphadenopathy.  Lungs: Clear to auscultation bilaterally.  Heart: Regular rate and rhythm, no murmurs rubs or gallops.  Abdomen: Bowel sounds are normal, nontender, nondistended, no hepatosplenomegaly or masses, no abdominal bruits or    hernia , no rebound or guarding.   Rectal: hemorrhoid noted externally without bleeding or thrombosis. Internal DRE with no palpable mass, nontender, bloody secretions noted.  Extremities: No lower extremity edema. No clubbing or deformities.  Neuro: Alert and oriented x 4 , grossly normal neurologically.  Skin: Warm and dry, no rash or jaundice.   Psych: Alert and cooperative, normal mood and affect.  Labs: Lab Results  Component Value Date   CREATININE 0.72 02/20/2016   BUN 11 02/20/2016   NA 134 (L) 02/20/2016   K 3.6 02/20/2016   CL 99 (L)  02/20/2016   CO2 23 02/20/2016   Lab Results  Component Value Date   ALT 26 02/20/2016   AST 25 02/20/2016   ALKPHOS 66 02/20/2016   BILITOT 0.8 02/20/2016   Lab Results  Component Value Date   WBC 13.0 (H) 02/20/2016   HGB 13.4 02/20/2016   HCT 40.5 02/20/2016   MCV 106.6 (H) 02/20/2016   PLT 254 02/20/2016   Lab Results  Component Value Date   TSH 0.403 02/20/2016     Imaging Studies: No results found.

## 2016-03-09 NOTE — Discharge Instructions (Addendum)
Colonoscopy Discharge Instructions  Read the instructions outlined below and refer to this sheet in the next few weeks. These discharge instructions provide you with general information on caring for yourself after you leave the hospital. Your doctor may also give you specific instructions. While your treatment has been planned according to the most current medical practices available, unavoidable complications occasionally occur. If you have any problems or questions after discharge, call Dr. Gala Romney at [phone]. ACTIVITY  You may resume your regular activity, but move at a slower pace for the next 24 hours.   Take frequent rest periods for the next 24 hours.   Walking will help get rid of the air and reduce the bloated feeling in your belly (abdomen).   No driving for 24 hours (because of the medicine (anesthesia) used during the test).    Do not sign any important legal documents or operate any machinery for 24 hours (because of the anesthesia used during the test).  NUTRITION  Drink plenty of fluids.   You may resume your normal diet as instructed by your doctor.   Begin with a light meal and progress to your normal diet. Heavy or fried foods are harder to digest and may make you feel sick to your stomach (nauseated).   Avoid alcoholic beverages for 24 hours or as instructed.  MEDICATIONS  You may resume your normal medications unless your doctor tells you otherwise.  WHAT YOU CAN EXPECT TODAY  Some feelings of bloating in the abdomen.   Passage of more gas than usual.   Spotting of blood in your stool or on the toilet paper.  IF YOU HAD POLYPS REMOVED DURING THE COLONOSCOPY:  No aspirin products for 7 days or as instructed.   No alcohol for 7 days or as instructed.   Eat a soft diet for the next 24 hours.  FINDING OUT THE RESULTS OF YOUR TEST Not all test results are available during your visit. If your test results are not back during the visit, make an appointment  with your caregiver to find out the results. Do not assume everything is normal if you have not heard from your caregiver or the medical facility. It is important for you to follow up on all of your test results.  SEEK IMMEDIATE MEDICAL ATTENTION IF:  You have more than a spotting of blood in your stool.   Your belly is swollen (abdominal distention).   You are nauseated or vomiting.   You have a temperature over 101.   You have abdominal pain or discomfort that is severe or gets worse throughout the day.    Colon polyp and hemorrhoid information provided  Further recommendations to follow pending review of pathology report  No MRI until clips gone  Colonoscopy, Adult, Care After This sheet gives you information about how to care for yourself after your procedure. Your doctor may also give you more specific instructions. If you have problems or questions, call your doctor. Follow these instructions at home: General instructions  For the first 24 hours after the procedure:  Do not drive or use machinery.  Do not sign important documents.  Do not drink alcohol.  Do your daily activities more slowly than normal.  Eat foods that are soft and easy to digest.  Rest often.  Take over-the-counter or prescription medicines only as told by your doctor.  It is up to you to get the results of your procedure. Ask your doctor, or the department performing the procedure, when  your results will be ready. To help cramping and bloating:  Try walking around.  Put heat on your belly (abdomen) as told by your doctor. Use a heat source that your doctor recommends, such as a moist heat pack or a heating pad.  Put a towel between your skin and the heat source.  Leave the heat on for 20-30 minutes.  Remove the heat if your skin turns bright red. This is especially important if you cannot feel pain, heat, or cold. You can get burned. Eating and drinking  Drink enough fluid to keep your  pee (urine) clear or pale yellow.  Return to your normal diet as told by your doctor. Avoid heavy or fried foods that are hard to digest.  Avoid drinking alcohol for as long as told by your doctor. Contact a doctor if:  You have blood in your poop (stool) 2-3 days after the procedure. Get help right away if:  You have more than a small amount of blood in your poop.  You see large clumps of tissue (blood clots) in your poop.  Your belly is swollen.  You feel sick to your stomach (nauseous).  You throw up (vomit).  You have a fever.  You have belly pain that gets worse, and medicine does not help your pain. This information is not intended to replace advice given to you by your health care provider. Make sure you discuss any questions you have with your health care provider. Document Released: 02/14/2010 Document Revised: 10/07/2015 Document Reviewed: 10/07/2015 Elsevier Interactive Patient Education  2017 [employer]vier Inc.  Hemorrhoids Introduction Hemorrhoids are swollen veins in and around the rectum or anus. Hemorrhoids can cause pain, itching, or bleeding. Most of the time, they do not cause serious problems. They usually get better with diet changes, lifestyle changes, and other home treatments. Follow these instructions at home: Eating and drinking  Eat foods that have fiber, such as whole grains, beans, nuts, fruits, and vegetables. Ask your doctor about taking products that have added fiber (fibersupplements).  Drink enough fluid to keep your pee (urine) clear or pale yellow. For Pain and Swelling  Take a warm-water bath (sitz bath) for 20 minutes to ease pain. Do this 3-4 times a day.  If directed, put ice on the painful area. It may be helpful to use ice between your warm baths.  Put ice in a plastic bag.  Place a towel between your skin and the bag.  Leave the ice on for 20 minutes, 2-3 times a day. General instructions  Take over-the-counter and prescription  medicines only as told by your doctor.  Medicated creams and medicines that are inserted into the anus (suppositories) may be used or applied as told.  Exercise often.  Go to the bathroom when you have the urge to poop (to have a bowel movement). Do not wait.  Avoid pushing too hard (straining) when you poop.  Keep the butt area dry and clean. Use wet toilet paper or moist paper towels.  Do not sit on the toilet for a long time. Contact a doctor if:  You have any of these:  Pain and swelling that do not get better with treatment or medicine.  Bleeding that will not stop.  Trouble pooping or you cannot poop.  Pain or swelling outside the area of the hemorrhoids. This information is not intended to replace advice given to you by your health care provider. Make sure you discuss any questions you have with your health care  provider. Document Released: 10/22/2007 Document Revised: 06/20/2015 Document Reviewed: 09/26/2014  2017 Elsevier

## 2016-03-09 NOTE — Anesthesia Preprocedure Evaluation (Signed)
Anesthesia Evaluation  Patient identified by MRN, date of birth, ID band Patient awake    Reviewed: Allergy & Precautions, NPO status , Patient's Chart, lab work & pertinent test results  Airway Mallampati: II  TM Distance: >3 FB     Dental  (+) Edentulous Upper, Edentulous Lower   Pulmonary COPD, Current Smoker,  Lung cancer with mets to bone ,brain.   breath sounds clear to auscultation       Cardiovascular negative cardio ROS   Rhythm:Regular Rate:Normal     Neuro/Psych    GI/Hepatic (+) Cirrhosis       ,   Endo/Other  Morbid obesity  Renal/GU      Musculoskeletal   Abdominal   Peds  Hematology   Anesthesia Other Findings   Reproductive/Obstetrics                            Anesthesia Physical Anesthesia Plan  ASA: IV  Anesthesia Plan: MAC   Post-op Pain Management:    Induction: Intravenous  Airway Management Planned: Simple Face Mask  Additional Equipment:   Intra-op Plan:   Post-operative Plan:   Informed Consent: I have reviewed the patients History and Physical, chart, labs and discussed the procedure including the risks, benefits and alternatives for the proposed anesthesia with the patient or authorized representative who has indicated his/her understanding and acceptance.     Plan Discussed with:   Anesthesia Plan Comments:         Anesthesia Quick Evaluation

## 2016-03-10 ENCOUNTER — Ambulatory Visit (HOSPITAL_COMMUNITY): Payer: BLUE CROSS/BLUE SHIELD

## 2016-03-10 ENCOUNTER — Encounter: Payer: Self-pay | Admitting: Internal Medicine

## 2016-03-11 ENCOUNTER — Encounter (HOSPITAL_COMMUNITY): Payer: Self-pay | Admitting: Internal Medicine

## 2016-03-12 ENCOUNTER — Ambulatory Visit (HOSPITAL_COMMUNITY): Payer: BLUE CROSS/BLUE SHIELD

## 2016-03-12 ENCOUNTER — Ambulatory Visit (HOSPITAL_COMMUNITY): Payer: BLUE CROSS/BLUE SHIELD | Admitting: Hematology & Oncology

## 2016-03-13 ENCOUNTER — Telehealth: Payer: Self-pay

## 2016-03-13 ENCOUNTER — Ambulatory Visit (HOSPITAL_COMMUNITY): Payer: BLUE CROSS/BLUE SHIELD

## 2016-03-13 ENCOUNTER — Ambulatory Visit (HOSPITAL_COMMUNITY)
Admission: RE | Admit: 2016-03-13 | Discharge: 2016-03-13 | Disposition: A | Discharge status: Home / Self Care | Payer: BLUE CROSS/BLUE SHIELD | Source: Ambulatory Visit | Attending: Oncology | Admitting: Oncology

## 2016-03-13 DIAGNOSIS — C7951 Secondary malignant neoplasm of bone: Secondary | ICD-10-CM | POA: Insufficient documentation

## 2016-03-13 DIAGNOSIS — R590 Localized enlarged lymph nodes: Secondary | ICD-10-CM | POA: Insufficient documentation

## 2016-03-13 DIAGNOSIS — C3491 Malignant neoplasm of unspecified part of right bronchus or lung: Secondary | ICD-10-CM | POA: Insufficient documentation

## 2016-03-13 LAB — GLUCOSE, CAPILLARY: Glucose-Capillary: 85 mg/dL (ref 65–99)

## 2016-03-13 MED ORDER — FLUDEOXYGLUCOSE F - 18 (FDG) INJECTION
10.3000 | Freq: Once | INTRAVENOUS | Status: AC | PRN
  Administered 2016-03-13: 10.3 via INTRAVENOUS

## 2016-03-13 NOTE — Progress Notes (Signed)
Nutrition Follow-up:  Patient nutrition follow-up completed via phone this pm.  Patient had PET scan today and coloscopy recently.  Patient with lung cancer and receiving chemotherapy  Wife reports appetite is about 50-50.  She is preparing patient good sources of protein (hamburgers, chicken, fish, tuna salad, eggs, soups).  Does not like ensure/boost drinks and did not try carnation instant breakfast that wife wanted him to try.  Wife reports if he wants it to eat I let him eat it."  Medications: reviewed  Labs: reviewed  Anthropometrics:  Noted weight on 2/12 of 220 pounds, decreased from 224 pounds on 1/4.     NUTRITION DIAGNOSIS: Inadequate oral intake continues   MALNUTRITION DIAGNOSIS: Moderate malnutrition continues   INTERVENTION:   Wife asking what is patient prognosis. Encouraged wife to ask MD.  Noted upcoming appointment on 2/20 and wife aware of that appointment.   Encouraged good sources of calories and protein.   Encouraged wife to call with questions.  Has contact information as previously mailed    MONITORING, EVALUATION, GOAL: Patient to consume adequate calories and protein to prevent further weight loss   NEXT VISIT: as needed  Carl Simmons, [place], [place] (pager)

## 2016-03-13 NOTE — Telephone Encounter (Signed)
Daneil Dolin, MD  Claudina Lick, LPN; Theadora Rama; [facility], PA-C        Send letter to patient.  Send copy of letter with path to referring provider and PCP.  Tom, patient was found to have a rather large pedunculated polyp in the distal descending colon. This could've produced some bleeding recently. In addition, he did have engorged hemorrhoids. Interestingly he did have some localized proctocolitis-limited to the distal colon/rectum endoscopically. Biopsies confirmed a moderately active colitis. Patient told me h  is rectal bleeding has settled down. 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  I'm not sure that all of his rectal bleeding could be attributed to chemotherapy. I would not be opposed to giving him another round of chemotherapy but start him on cort enemas (100 mg hydrocortisone retention enema one daily at bedtime - retained for 30 minutes) 1 week prior to next chemotherapy session and continuing for a month  afterwards as a prophylactic measure. See how he does and then go from there. What do you think?

## 2016-03-13 NOTE — Telephone Encounter (Signed)
Letter has been mailed to the pt.  Routing this message to [employer] PA-C

## 2016-03-17 ENCOUNTER — Encounter (HOSPITAL_COMMUNITY): Payer: Self-pay

## 2016-03-17 ENCOUNTER — Encounter (HOSPITAL_BASED_OUTPATIENT_CLINIC_OR_DEPARTMENT_OTHER): Payer: BLUE CROSS/BLUE SHIELD | Admitting: Oncology

## 2016-03-17 ENCOUNTER — Encounter (HOSPITAL_BASED_OUTPATIENT_CLINIC_OR_DEPARTMENT_OTHER): Payer: BLUE CROSS/BLUE SHIELD

## 2016-03-17 VITALS — BP 122/65 | HR 79 | Temp 98.2°F | Resp 18

## 2016-03-17 DIAGNOSIS — Z5112 Encounter for antineoplastic immunotherapy: Secondary | ICD-10-CM | POA: Diagnosis not present

## 2016-03-17 DIAGNOSIS — Z720 Tobacco use: Secondary | ICD-10-CM

## 2016-03-17 DIAGNOSIS — Z5111 Encounter for antineoplastic chemotherapy: Secondary | ICD-10-CM

## 2016-03-17 DIAGNOSIS — K529 Noninfective gastroenteritis and colitis, unspecified: Secondary | ICD-10-CM

## 2016-03-17 DIAGNOSIS — C3491 Malignant neoplasm of unspecified part of right bronchus or lung: Secondary | ICD-10-CM | POA: Diagnosis not present

## 2016-03-17 DIAGNOSIS — C7951 Secondary malignant neoplasm of bone: Secondary | ICD-10-CM

## 2016-03-17 DIAGNOSIS — C7931 Secondary malignant neoplasm of brain: Secondary | ICD-10-CM

## 2016-03-17 DIAGNOSIS — C3431 Malignant neoplasm of lower lobe, right bronchus or lung: Secondary | ICD-10-CM | POA: Diagnosis not present

## 2016-03-17 LAB — CBC WITH DIFFERENTIAL/PLATELET
BASOS PCT: 0 %
Basophils Absolute: 0 10*3/uL (ref 0.0–0.1)
EOS PCT: 0 %
Eosinophils Absolute: 0 10*3/uL (ref 0.0–0.7)
HCT: 41 % (ref 39.0–52.0)
HEMOGLOBIN: 13.5 g/dL (ref 13.0–17.0)
Lymphocytes Relative: 21 %
Lymphs Abs: 1.8 10*3/uL (ref 0.7–4.0)
MCH: 35.3 pg — AB (ref 26.0–34.0)
MCHC: 32.9 g/dL (ref 30.0–36.0)
MCV: 107.3 fL — AB (ref 78.0–100.0)
MONOS PCT: 9 %
Monocytes Absolute: 0.8 10*3/uL (ref 0.1–1.0)
NEUTROS PCT: 70 %
Neutro Abs: 6.2 10*3/uL (ref 1.7–7.7)
Platelets: 267 10*3/uL (ref 150–400)
RBC: 3.82 MIL/uL — AB (ref 4.22–5.81)
RDW: 16.1 % — ABNORMAL HIGH (ref 11.5–15.5)
WBC: 8.8 10*3/uL (ref 4.0–10.5)

## 2016-03-17 LAB — COMPREHENSIVE METABOLIC PANEL
ALK PHOS: 63 U/L (ref 38–126)
ALT: 34 U/L (ref 17–63)
AST: 47 U/L — AB (ref 15–41)
Albumin: 2.9 g/dL — ABNORMAL LOW (ref 3.5–5.0)
Anion gap: 9 (ref 5–15)
BUN: 9 mg/dL (ref 6–20)
CALCIUM: 8 mg/dL — AB (ref 8.9–10.3)
CHLORIDE: 100 mmol/L — AB (ref 101–111)
CO2: 27 mmol/L (ref 22–32)
CREATININE: 0.66 mg/dL (ref 0.61–1.24)
Glucose, Bld: 114 mg/dL — ABNORMAL HIGH (ref 65–99)
Potassium: 3.4 mmol/L — ABNORMAL LOW (ref 3.5–5.1)
Sodium: 136 mmol/L (ref 135–145)
Total Bilirubin: 0.7 mg/dL (ref 0.3–1.2)
Total Protein: 6.5 g/dL (ref 6.5–8.1)

## 2016-03-17 MED ORDER — SODIUM CHLORIDE 0.9 % IV SOLN
Freq: Once | INTRAVENOUS | Status: AC
  Administered 2016-03-17: 11:00:00 via INTRAVENOUS

## 2016-03-17 MED ORDER — DEXAMETHASONE SODIUM PHOSPHATE 10 MG/ML IJ SOLN
10.0000 mg | Freq: Once | INTRAMUSCULAR | Status: AC
  Administered 2016-03-17: 10 mg via INTRAVENOUS
  Filled 2016-03-17: qty 1

## 2016-03-17 MED ORDER — DOCETAXEL CHEMO INJECTION 160 MG/16ML
75.0000 mg/m2 | Freq: Once | INTRAVENOUS | Status: AC
  Administered 2016-03-17: 170 mg via INTRAVENOUS
  Filled 2016-03-17: qty 17

## 2016-03-17 MED ORDER — HYDROCORTISONE 100 MG/60ML RE ENEM: 1.0000 | ENEMA | Freq: Every day | RECTAL | 0 refills | Status: AC

## 2016-03-17 MED ORDER — SODIUM CHLORIDE 0.9 % IV SOLN
1000.0000 mg | Freq: Once | INTRAVENOUS | Status: AC
  Administered 2016-03-17: 1000 mg via INTRAVENOUS
  Filled 2016-03-17: qty 100

## 2016-03-17 MED ORDER — DIPHENHYDRAMINE HCL 50 MG/ML IJ SOLN
50.0000 mg | Freq: Once | INTRAMUSCULAR | Status: AC
  Administered 2016-03-17: 50 mg via INTRAVENOUS
  Filled 2016-03-17: qty 1

## 2016-03-17 MED ORDER — HYDROCORTISONE 100 MG/60ML RE ENEM: 100.0000 mg | ENEMA | Freq: Every day | RECTAL | Status: DC

## 2016-03-17 MED ORDER — ACETAMINOPHEN 325 MG PO TABS
650.0000 mg | ORAL_TABLET | Freq: Once | ORAL | Status: AC
  Administered 2016-03-17: 650 mg via ORAL
  Filled 2016-03-17: qty 2

## 2016-03-17 MED ORDER — HEPARIN SOD (PORK) LOCK FLUSH 100 UNIT/ML IV SOLN
500.0000 [IU] | Freq: Once | INTRAVENOUS | Status: AC | PRN
  Administered 2016-03-17: 500 [IU]
  Filled 2016-03-17: qty 5

## 2016-03-17 MED ORDER — PEGFILGRASTIM 6 MG/0.6ML ~~LOC~~ PSKT
6.0000 mg | PREFILLED_SYRINGE | Freq: Once | SUBCUTANEOUS | Status: AC
  Administered 2016-03-17: 6 mg via SUBCUTANEOUS
  Filled 2016-03-17: qty 0.6

## 2016-03-17 NOTE — Patient Instructions (Signed)
[place] at [facility] Discharge Instructions  RECOMMENDATIONS MADE BY THE CONSULTANT AND ANY TEST RESULTS WILL BE SENT TO YOUR REFERRING PHYSICIAN.  You were seen today by Dr. Twana First Follow up in 4 week See Amy up front for appointments   Thank you for choosing [place] at [facility] to provide your oncology and hematology care.  To afford each patient quality time with our provider, please arrive at least 15 minutes before your scheduled appointment time.    If you have a lab appointment with the [facility] please come in thru the  Main Entrance and check in at the main information desk  You need to re-schedule your appointment should you arrive 10 or more minutes late.  We strive to give you quality time with our providers, and arriving late affects you and other patients whose appointments are after yours.  Also, if you no show three or more times for appointments you may be dismissed from the clinic at the providers discretion.     Again, thank you for choosing [facility].  Our hope is that these requests will decrease the amount of time that you wait before being seen by our physicians.       _____________________________________________________________  Should you have questions after your visit to [facility], please contact our office at [phone] between the hours of 8:30 a.m. and 4:30 p.m.  Voicemails left after 4:30 p.m. will not be returned until the following business day.  For prescription refill requests, have your pharmacy contact our office.       Resources For Cancer Patients and their Caregivers ? American Cancer Society: Can assist with transportation, wigs, general needs, runs Look Good Feel Better.        364-182-1063 ? Cancer Care: Provides financial assistance, online support groups, medication/co-pay assistance.  1-800-813-HOPE (501) 262-8297) ? [place] Assists [place] cancer patients and their families through emotional , educational and financial support.  380-191-5589 ? [place] DSS Where to apply for food stamps, Medicaid and utility assistance. (514)508-5521 ? RCATS: Transportation to medical appointments. 563-044-1853 ? Social Security Administration: May apply for disability if have a Stage IV cancer. (843)396-9637 410-693-8490 ? [employer], Disability and Transit Services: Assists with nutrition, care and transit needs. [place] Support Programs: '@10RELATIVEDAYS'$ @ > Cancer Support Group  2nd Tuesday of the month 1pm-2pm, Journey Room  > Creative Journey  3rd Tuesday of the month 1130am-1pm, Journey Room  > Look Good Feel Better  1st Wednesday of the month 10am-12 noon, Journey Room (Call [place] to register [phone])

## 2016-03-17 NOTE — Patient Instructions (Signed)
[facility] Discharge Instructions for Patients Receiving Chemotherapy   Beginning January 23rd 2017 lab work for the [facility] will be done in the  Main lab at [facility] on 1st floor. If you have a lab appointment with the [facility] please come in thru the  Main Entrance and check in at the main information desk   Today you received the following chemotherapy agents cyramza and taxotere.  To help prevent nausea and vomiting after your treatment, we encourage you to take your nausea medication as instructed.  Neulasta Onpro will dispense medication between 345 pm and 445 pm tomorrow. You may take device off after 5 pm tomorrow, Wednesday Feb 21st.   If you develop nausea and vomiting, or diarrhea that is not controlled by your medication, call the clinic.  The clinic phone number is [phone]. Office hours are Monday-Friday 8:30am-5:00pm.  BELOW ARE SYMPTOMS THAT SHOULD BE REPORTED IMMEDIATELY:  *FEVER GREATER THAN 101.0 F  *CHILLS WITH OR WITHOUT FEVER  NAUSEA AND VOMITING THAT IS NOT CONTROLLED WITH YOUR NAUSEA MEDICATION  *UNUSUAL SHORTNESS OF BREATH  *UNUSUAL BRUISING OR BLEEDING  TENDERNESS IN MOUTH AND THROAT WITH OR WITHOUT PRESENCE OF ULCERS  *URINARY PROBLEMS  *BOWEL PROBLEMS  UNUSUAL RASH Items with * indicate a potential emergency and should be followed up as soon as possible. If you have an emergency after office hours please contact your primary care physician or go to the nearest emergency department.  Please call the clinic during office hours if you have any questions or concerns.   You may also contact the Patient Navigator at [phone] should you have any questions or need assistance in obtaining follow up care.      Resources For Cancer Patients and their Caregivers ? American Cancer Society: Can assist with transportation, wigs, general needs, runs Look Good Feel Better.        (559)515-3881 ? Cancer  Care: Provides financial assistance, online support groups, medication/co-pay assistance.  1-800-813-HOPE 601-034-4693) ? [place] Assists [place] cancer patients and their families through emotional , educational and financial support.  (306)590-4903 ? [place] DSS Where to apply for food stamps, Medicaid and utility assistance. 512-614-9550 ? RCATS: Transportation to medical appointments. 667-244-0774 ? Social Security Administration: May apply for disability if have a Stage IV cancer. 780-244-1976 604-117-0697 ? [employer], Disability and Transit Services: Assists with nutrition, care and transit needs. 216-672-4239

## 2016-03-17 NOTE — Progress Notes (Signed)
Tolerated chemo well. Carl KitchenPaschal Simmons arrived today for [facility] neulasta on body injector. See MAR for administration details. Injector in place and engaged with green light indicator on flashing. Tolerated application with out problems. Stable and ambulatory on discharge home with sister.

## 2016-03-17 NOTE — Progress Notes (Signed)
Carl Burly, MD [place] [id number]  No diagnosis found.  CURRENT THERAPY: Docetaxel + Ramucirumab beginning on 11/07/2015  INTERVAL HISTORY: Carl Simmons 56 y.o. 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 Treated by Dr. Tressie Stalker at [facility] with Carboplatin/Pemetrexed every 21 days x 6 cycles (12/10/2014- 03/25/2015) with transition to maintenance Pemetrexed every 21 days with last dose being administered on 08/01/2015. Imaging demonstrated progression of disease, resulting in a change in therapy to [place] beginning on 08/29/2015- 10/24/2015.  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 Lisbeth Renshaw. PET scan on 10/31/2015 demonstrated progression of disease resulting in a change in therapy to Docetaxel and Ramucirumab beginning on 11/07/2015.    Adenocarcinoma of right lung ([place])   11/20/2014 Procedure    Right scalp skin biopsy      11/20/2014 Pathology Results    Adenocarcinoma of lung primary. PDL-1 NEGATIVE (0% tumor proportion score); EGFR mutation NEGATIVE; no evidence of ALK gene rearrangement detected by FISH; no reaarnagement of ROS1 gene detected.      11/29/2014 PET scan    Large hypermetabolic mass within the RLL with central necrosis. Hypermetabolic right suprahilar mass with postobstructive collapse of the RUL. Subcarinal, paratracheal, prevascular and R internal mammary nodal metastases. Small R adrenal gland nodule.      12/10/2014 - 03/25/2015 Chemotherapy    Carboplatin and pemetrexed 6 cycles      02/11/2015 PET scan    Partial metabolic response to therapy, with decrease in hypermetabolic right lung masses, postobstructive right upper lobe consolidation, thoracic lymphadenopathy and right adrenal metastasis. No new or progressive disease identified.      04/05/2015 PET scan    No significant change in hypermetabolic right lung masses  and thoracic lymphadenopathy. No new or progressive disease identified.      04/15/2015 - 08/01/2015 Chemotherapy    Pemetrexed maintenance      08/14/2015 PET scan    Overall progression of metastatic lung cancer. Increasingly hypermetabolic mediastinal lymph nodes and R lung masses, enlarging R adrenal mass and new osseous lesions. Enlarging hypermetabolic R-sided scalp nodules.      08/14/2015 Progression    Progression of disease on PET imaging      08/29/2015 - 10/24/2015 Chemotherapy    Opdivo      08/29/2015 Miscellaneous    Xgeva started monthly      09/05/2015 Imaging    MRI brain- Multiple metastatic deposits in the brain. At least 9 lesions are identified. Mild edema around the 10 mm lesion in the right cerebellum.      09/27/2015 - 09/27/2015 Radiation Therapy    Dr. Lisbeth Renshaw- SRS to brain mets:  PTV target 1-10 were treated using 2 treatment plans. A single isocenter technique was utilized to treat 8 lesion simultaneously. PTV's 2 and 5 were then treated simultaneously with a second radiation treatment plan due to their close proximity. A prescription dose of 20 Gy was delivered to each lesion. ExacTrac Snap verification was performed for each couch angle.       10/31/2015 PET scan    1. Overall there has been interval progression of disease compared with the previous exam. 2. There is an increased FDG uptake associated with the dominant mass involving the right lower lobe. Additionally, there are new hypermetabolic pre-vascular and right posterior cervical lymph nodes. 3. Significant interval increase in FDG  uptake associated with right upper lobe pulmonary nodule and right adrenal gland metastases. 4. New hypermetabolic metastasis is involving the left hip. Additionally, there is intense uptake associated with fractured right rib. Underlying lesion not excluded. 5. Increase in FDG uptake associated with lesion involving the posterior nasopharynx.      10/31/2015 Progression     Progression of disease      11/07/2015 Treatment Plan Change    D/C Opdivo and change to Docetaxel + Ramucirumab      11/07/2015 -  Chemotherapy    Docetaxel + Ramucirumab       01/06/2016 PET scan    1. Overall positive response to therapy with reduction in size and metabolic activity of metastatic lesions. 2. Decreased in the peripheral metabolic activity of the large RIGHT lower lobe mass. 3. Decrease in metabolic activity of metastatic mediastinal lymph nodes. 4. Decrease in metabolic activity of RIGHT adrenal gland metastasis. 5. Decreased metabolic activity skeletal metastasis      01/15/2016 - 01/19/2016 Hospital Admission    Admit date: 01/15/2016 Admission diagnosis: Rectal bleeding Additional comments: Colitis      01/15/2016 Imaging    CT abd/pelvis- 1. Bowel wall thickening involving the sigmoid colon and to a lesser extent descending colon concerning for colitis. 2. 5.4 cm right lower lobe pulmonary mass most consistent with primary lung malignancy. 3.  Aortic Atherosclerosis       03/13/2016 PET scan    1. Mixed response within the chest. The dominant right lower lobe mass demonstrates continued retraction with central necrosis, although shows mildly increased peripheral hypermetabolic activity medially. 2. Most of the mediastinal lymph nodes are unchanged, although there is new subcarinal and right cervical hypermetabolic adenopathy suspicious for progressive disease. 3. Increased metabolic activity within the right adrenal gland without enlarging nodule. 4. Persistent hypermetabolic activity in the left femoral neck, mildly increased compared with previous study. No new lesions are seen. 5. Persistent sigmoid colon wall thickening suspicious for colitis.       Carl Simmons presents to the clinic today accompanied by her sister  for cycle 7 Docetaxel/ Ramucirumab. I personally reviewed and went over PET scans with the patient in detail.  He  notes that about 5 days after chemotherapy he passes blood in his stool, however in 2 or 3 days it resolves. He states it has been happening for the past 3 chemotherapy treatments. He has recently undergone a colonoscopy for evaluation of this GI bleeding and was only found to have polyps and hemorrhoids, no source of bleeding was found.  He still smokes. He smokes about half a pack a day.   States his appetite is about at  50%. He notes he lost a few pounds since his last visit. He notes he has occasional diarrhea. Denies constipation.    Review of Systems  Constitutional: Positive for weight loss (lost a few pounds since his last visit).       Appetite is 50% of normal.   HENT: Negative.   Eyes: Negative.   Respiratory: Negative.   Cardiovascular: Negative.   Gastrointestinal: Positive for blood in stool (5 days after chemotherapy, been happening for past 3 chemotherapy treatments.) and diarrhea (occasional ). Negative for constipation.  Genitourinary: Negative.   Musculoskeletal: Negative.   Skin: Negative.   Neurological: Negative.   Endo/Heme/Allergies: Negative.   Psychiatric/Behavioral: Negative.   All other systems reviewed and are negative.   Past Medical History:  Diagnosis Date  . Adenocarcinoma of right lung ([place]) 07/11/2015  .  Bone metastasis ([place]) 08/24/2015  . Cirrhosis of liver ([place]) 08/14/2015   PET on 08/14/2015 demonstrates cirrhotic liver.     Past Surgical History:  Procedure Laterality Date  . BIOPSY  03/09/2016   Procedure: BIOPSY;  Surgeon: Daneil Dolin, MD;  Location: [facility];  Service: Endoscopy;;  sigmoid and rectal biopsies  . COLONOSCOPY WITH PROPOFOL N/A 03/09/2016   Procedure: COLONOSCOPY WITH PROPOFOL;  Surgeon: Daneil Dolin, MD;  Location: [facility];  Service: Endoscopy;  Laterality: N/A;  100 - pt knows to arrive at 7:45  . POLYPECTOMY  03/09/2016   Procedure: POLYPECTOMY;  Surgeon: Daneil Dolin, MD;  Location: [facility];   Service: Endoscopy;;  at splenic flexure x2; distal descending colon  . right power port placement Right    right subclavian    Family History  Problem Relation Age of Onset  . Alzheimer's disease Mother 48  . CAD Mother   . Diabetes Mother   . Cancer Father     thyroid cancer  . CVA Father 51  . Lupus Sister   . Cancer Sister     metastatic ovarian  . Colon cancer Neg Hx     Social History   Social History  . Marital status: Married    Spouse name: N/A  . Number of children: N/A  . Years of education: N/A   Occupational History  . disabled    Social History Main Topics  . Smoking status: Current Every Day Smoker    Packs/day: 2.00  . Smokeless tobacco: Never Used  . Alcohol use No  . Drug use: No  . Sexual activity: Not Asked   Other Topics Concern  . None   Social History Narrative  . None     PHYSICAL EXAMINATION  ECOG PERFORMANCE STATUS: 1 - Symptomatic but completely ambulatory  Physical Exam  Constitutional: He is oriented to person, place, and time and well-developed, well-nourished, and in no distress.  HENT:  Head: Normocephalic and atraumatic.  Eyes: Conjunctivae and EOM are normal. Pupils are equal, round, and reactive to light.  Neck: Normal range of motion. Neck supple.  Cardiovascular: Normal rate, regular rhythm and normal heart sounds.   Pulmonary/Chest: Effort normal.  rhonchi in all lung fields.  Abdominal: Soft. Bowel sounds are normal.  Musculoskeletal: Normal range of motion.  Neurological: He is alert and oriented to person, place, and time. Gait normal.  Skin: Skin is warm and dry.  Nursing note and vitals reviewed.    LABORATORY DATA: CBC    Component Value Date/Time   WBC 8.8 03/17/2016 0933   RBC 3.82 (L) 03/17/2016 0933   HGB 13.5 03/17/2016 0933   HCT 41.0 03/17/2016 0933   PLT 267 03/17/2016 0933   MCV 107.3 (H) 03/17/2016 0933   MCH 35.3 (H) 03/17/2016 0933   MCHC 32.9 03/17/2016 0933   RDW 16.1 (H) 03/17/2016  0933   LYMPHSABS 1.8 03/17/2016 0933   MONOABS 0.8 03/17/2016 0933   EOSABS 0.0 03/17/2016 0933   BASOSABS 0.0 03/17/2016 0933      Chemistry      Component Value Date/Time   NA 136 03/17/2016 0933   K 3.4 (L) 03/17/2016 0933   CL 100 (L) 03/17/2016 0933   CO2 27 03/17/2016 0933   BUN 9 03/17/2016 0933   CREATININE 0.66 03/17/2016 0933      Component Value Date/Time   CALCIUM 8.0 (L) 03/17/2016 0933   ALKPHOS 63 03/17/2016 0933   AST  47 (H) 03/17/2016 0933   ALT 34 03/17/2016 0933   BILITOT 0.7 03/17/2016 0933        PENDING LABS:   RADIOGRAPHIC STUDIES: I have personally reviewed the radiological images as listed and agreed with the findings in the report.  NUCLEAR MEDICINE PET SKULL BASE TO THIGH 03/13/2016  IMPRESSION: 1. Mixed response within the chest. The dominant right lower lobe mass demonstrates continued retraction with central necrosis, although shows mildly increased peripheral hypermetabolic activity medially. 2. Most of the mediastinal lymph nodes are unchanged, although there is new subcarinal and right cervical hypermetabolic adenopathy suspicious for progressive disease. 3. Increased metabolic activity within the right adrenal gland without enlarging nodule. 4. Persistent hypermetabolic activity in the left femoral neck, mildly increased compared with previous study. No new lesions are seen. 5. Persistent sigmoid colon wall thickening suspicious for colitis.   PATHOLOGY:    ASSESSMENT AND PLAN:  PET scan reviewed. Results noted above. He's had a mixed response with decrease in the primary tumor in the RLL, mediastinal lymphadenopathy unchanged, hypermetabolic right femoral head, hypermetabolic left adrenal without definite mass,  Continue current treatment, patient does not want to stop getting treatment. If he progresses on his next restaging scans in 3 months, I would recommend for patient to consider hospice. Patient was seen by Dr. Gala Romney  and was found to have a large pedunculated polyp in the distal descending colon, engorged hemorrhoids, and localized proctocolitis limited to the distal colon/rectum. Biopsies confirmed active colitis. Dr. Coralee North questions whether the patient may have quiescent baseline IBD. He recommend to start patient on cortenemas (146m hydrocortisone rentention enemas one daily at bedtime and retained for 30 min), 1 week prior to next chemotherapy and continued for a month afterwards. I will send in a Rx for the enemas today for the patient. Counseled on smoke cessation, however patient states that he wants to keep smoking. RTC in 4 weeks for follow up.   THERAPY PLAN:  Continue treatment as outlined above: Docetaxel + Ramucirumab   All questions were answered. The patient knows to call the clinic with any problems, questions or concerns. We can certainly see the patient much sooner if necessary.  This document serves as a record of services personally performed by LTwana First MD. It was created on her behalf by SShirlean Mylar a trained medical scribe. The creation of this record is based on the scribe's personal observations and the provider's statements to them. This document has been checked and approved by the attending provider.  I have reviewed the above documentation for accuracy and completeness and I agree with the above.   This note is electronically signed by: SMikey College2/20/2018 10:48 AM

## 2016-03-18 ENCOUNTER — Telehealth (HOSPITAL_COMMUNITY): Payer: Self-pay | Admitting: *Deleted

## 2016-03-18 NOTE — Telephone Encounter (Signed)
Called [facility], they said they could to a new kit containing a mask and mouthpiece for 3.00. Called patient and notified wife. She stated that was fine. 

## 2016-03-30 ENCOUNTER — Other Ambulatory Visit: Payer: Self-pay | Admitting: Radiation Oncology

## 2016-03-30 NOTE — Progress Notes (Signed)
Carl Simmons 56 y.o. man with Stage IV NSCLC, adenocarcinoma of the right lung with brain metastases SRS to brain completed [id number] Review 04-02-16 MRI brain w wo contrast FU.    Headache:None Pain:None Dizziness:None Nausea/Vomiting/Ataxia: Visional changes(Blurred/double vision, blind spots and peripheral vision changes):Blurred vision bilateral last 5-10 minutes Ring in ears:None Fatigue:Having fatigue all the time Cognitive changes: Denies  short term memory recall or  difficult word finding. Weight changes, if any:  Wt Readings from Last 3 Encounters:  04/06/16 207 lb 9.6 oz (94.2 kg)  03/09/16 220 lb (99.8 kg)  02/27/16 220 lb (99.8 kg)      Appetite :Eating one to two meals a day. Respiratory complaints, if any:SOB when he walks,coughing spells with clear secretions Imaging: 04-02-16 MRI brain w wo contrast Lab: N/A BP (!) 133/93   Pulse 92   Temp 97.6 F (36.4 C) (Oral)   Resp 18   Ht '5\' 8"'$  (1.727 m)   Wt 207 lb 9.6 oz (94.2 kg)   SpO2 100%   BMI 31.57 kg/m

## 2016-04-02 ENCOUNTER — Ambulatory Visit
Admission: RE | Admit: 2016-04-02 | Discharge: 2016-04-02 | Disposition: A | Discharge status: Home / Self Care | Payer: BLUE CROSS/BLUE SHIELD | Source: Ambulatory Visit | Attending: Radiation Oncology | Admitting: Radiation Oncology

## 2016-04-02 DIAGNOSIS — C7931 Secondary malignant neoplasm of brain: Secondary | ICD-10-CM

## 2016-04-02 DIAGNOSIS — C7949 Secondary malignant neoplasm of other parts of nervous system: Principal | ICD-10-CM

## 2016-04-02 MED ORDER — GADOBENATE DIMEGLUMINE 529 MG/ML IV SOLN
20.0000 mL | Freq: Once | INTRAVENOUS | Status: AC | PRN
  Administered 2016-04-02: 20 mL via INTRAVENOUS

## 2016-04-06 ENCOUNTER — Ambulatory Visit
Admission: RE | Admit: 2016-04-06 | Discharge: 2016-04-06 | Disposition: A | Discharge status: Home / Self Care | Payer: BLUE CROSS/BLUE SHIELD | Source: Ambulatory Visit | Attending: Radiation Oncology | Admitting: Radiation Oncology

## 2016-04-06 ENCOUNTER — Encounter: Payer: Self-pay | Admitting: Radiation Oncology

## 2016-04-06 VITALS — BP 133/93 | HR 92 | Temp 97.6°F | Resp 18 | Ht 68.0 in | Wt 207.6 lb

## 2016-04-06 DIAGNOSIS — Z79899 Other long term (current) drug therapy: Secondary | ICD-10-CM | POA: Insufficient documentation

## 2016-04-06 DIAGNOSIS — F17210 Nicotine dependence, cigarettes, uncomplicated: Secondary | ICD-10-CM | POA: Diagnosis not present

## 2016-04-06 DIAGNOSIS — C3491 Malignant neoplasm of unspecified part of right bronchus or lung: Secondary | ICD-10-CM | POA: Diagnosis not present

## 2016-04-06 DIAGNOSIS — C7931 Secondary malignant neoplasm of brain: Secondary | ICD-10-CM | POA: Diagnosis not present

## 2016-04-06 NOTE — Progress Notes (Addendum)
Radiation Oncology         [phone] ________________________________  Name: Carl Simmons MRN: [id number]  Date: 04/06/2016  DOB: 09-Aug-1960  WL:NLGX Jan Fireman, MD  [name], MD     REFERRING PHYSICIAN: [name], MD   DIAGNOSIS: The encounter diagnosis was Brain metastases ([place]).   HISTORY OF PRESENT ILLNESS: Carl Simmons is a 56 y.o. 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 The patient was treated with SRS to 10 lesions in September 2017 has been seen in surveillance without disease in December 2017. He went for a repeat MRI of the brain on 04/02/16 revealing three new lesions, one in the right superior cerbellar surface, right temporal lobe, and left frontal lobe. None had evidence of edema, and his previously treated disease is stable to improved. He continues on systemic therapy at [facility] with Cyramza/Taxotere, and he is due for his next infusion tomorrow.         PREVIOUS RADIATION THERAPY: Yes   09/27/2015 SRS Treatment: PTV target 1-10 were treated using 2 treatment plans. A single isocenter technique was utilized to treat 8 lesion simultaneously. PTV's 2 and 5 were then treated simultaneously with a second radiation treatment plan due to their close proximity. A prescription dose of 20 Gy was delivered to each lesion. ExacTrac Snap verification was performed for each couch angle.    PAST MEDICAL HISTORY:  Past Medical History:  Diagnosis Date  . Adenocarcinoma of right lung ([place]) 07/11/2015  . Bone metastasis ([place]) 08/24/2015  . Cirrhosis of liver ([place]) 08/14/2015   PET on 08/14/2015 demonstrates cirrhotic liver.        PAST SURGICAL HISTORY: Past Surgical History:  Procedure Laterality Date  . BIOPSY  03/09/2016   Procedure: BIOPSY;  Surgeon: Daneil Dolin, MD;  Location: [facility];  Service: Endoscopy;;  sigmoid and rectal biopsies  . COLONOSCOPY WITH PROPOFOL  N/A 03/09/2016   Procedure: COLONOSCOPY WITH PROPOFOL;  Surgeon: Daneil Dolin, MD;  Location: [facility];  Service: Endoscopy;  Laterality: N/A;  100 - pt knows to arrive at 7:45  . POLYPECTOMY  03/09/2016   Procedure: POLYPECTOMY;  Surgeon: Daneil Dolin, MD;  Location: [facility];  Service: Endoscopy;;  at splenic flexure x2; distal descending colon  . right power port placement Right    right subclavian     FAMILY HISTORY:  Family History  Problem Relation Age of Onset  . Alzheimer's disease Mother 29  . CAD Mother   . Diabetes Mother   . Cancer Father     thyroid cancer  . CVA Father 85  . Lupus Sister   . Cancer Sister     metastatic ovarian  . Colon cancer Neg Hx      SOCIAL HISTORY:  reports that he has been smoking.  He has been smoking about 2.00 packs per day. He has never used smokeless tobacco. He reports that he does not drink alcohol or use drugs.   ALLERGIES: Patient has no known allergies.   MEDICATIONS:  Current Outpatient Prescriptions  Medication Sig Dispense Refill  . cyanocobalamin (,VITAMIN B-12,) 1000 MCG/ML injection Inject 1,000 mcg into the muscle every 30 (thirty) days.    . folic acid (FOLVITE) 1 MG tablet Take 1 mg by mouth daily.     . hydrocortisone (CORTENEMA) 100 MG/60ML enema Place 1 enema (100 mg total) rectally  at bedtime. Retain for 30 minutes daily at bedtime. 30 enema 0  . omega-3 acid ethyl esters (LOVAZA) 1 g capsule Take 1 g by mouth daily.  1  . Omega-3 Fatty Acids (FISH OIL) 1000 MG CAPS Take by mouth daily.    Marland Kitchen omeprazole (PRILOSEC) 20 MG capsule Take 1 capsule (20 mg total) by mouth daily. 30 capsule 2  . phosphorus (PHOSPHA 250 NEUTRAL) 155-852-130 MG tablet Take 1 tablet (250 mg total) by mouth 3 (three) times daily. 90 tablet 0  . potassium chloride SA (K-DUR,KLOR-CON) 20 MEQ tablet Take 1 tablet (20 mEq total) by mouth 2 (two) times daily. 60 tablet 0  . polyethylene glycol-electrolytes (TRILYTE) 420 g solution Take  4,000 mLs by mouth as directed. (Patient not taking: Reported on 04/06/2016) 4000 mL 0  . PROAIR HFA 108 (90 Base) MCG/ACT inhaler Inhale 1 puff into the lungs daily as needed for shortness of breath.  0   No current facility-administered medications for this encounter.      REVIEW OF SYSTEMS: On review of systems, the patient reports that he is doing well overall. He reports his wife is in the hospital in the ICU and is hoping to wean off of this. She apparently and a COPD exacerbation. He would like to speak with palliative care for himself in the hopes his wife will get better to help in these decisions. He denies any chest pain, shortness of breath, cough, fevers, chills, night sweats. His appetite is intact but sense of taste has worsened with chemotherapy. As a result he's lost about 15 pounds. He denies any bowel or bladder disturbances, and denies abdominal pain, nausea or vomiting. He denies any new musculoskeletal or joint aches or pains. A complete review of systems is obtained and is otherwise negative.     PHYSICAL EXAM:  Wt Readings from Last 3 Encounters:  04/06/16 207 lb 9.6 oz (94.2 kg)  03/09/16 220 lb (99.8 kg)  02/27/16 220 lb (99.8 kg)   Temp Readings from Last 3 Encounters:  04/06/16 97.6 F (36.4 C) (Oral)  03/17/16 98.2 F (36.8 C) (Oral)  03/09/16 97.9 F (36.6 C)   BP Readings from Last 3 Encounters:  04/06/16 (!) 133/93  03/17/16 122/65  03/09/16 101/83   Pulse Readings from Last 3 Encounters:  04/06/16 92  03/17/16 79  03/09/16 90   Pain Assessment Pain Score: 0-No pain/10  In general this is a chronically ill appearing caucasian male in no acute distress. He's alert and oriented x4 and appropriate throughout the examination. Cardiopulmonary assessment is negative for acute distress and he exhibits normal effort. Skin is dry and flaky and most of his nail beds are hyperkeratotic or sloughed off since chemotherapy. HEENT is normocephalic, atraumatic.  EOMs are intact.  He is grossly intact neurologically and without gait disturbances.  ECOG = 1  0 - Asymptomatic (Fully active, able to carry on all predisease activities without restriction)  1 - Symptomatic but completely ambulatory (Restricted in physically strenuous activity but ambulatory and able to carry out work of a light or sedentary nature. For example, light housework, office work)  2 - Symptomatic, <50% in bed during the day (Ambulatory and capable of all self care but unable to carry out any work activities. Up and about more than 50% of waking hours)  3 - Symptomatic, >50% in bed, but not bedbound (Capable of only limited self-care, confined to bed or chair 50% or more of waking hours)  4 - Bedbound (  Completely disabled. Cannot carry on any self-care. Totally confined to bed or chair)  5 - Death   Eustace Pen MM, Creech RH, Tormey DC, et al. [phone]). "Toxicity and response criteria of the [facility]". [place] Oncol. 5 (6): 649-55    LABORATORY DATA:  Lab Results  Component Value Date   WBC 8.8 03/17/2016   HGB 13.5 03/17/2016   HCT 41.0 03/17/2016   MCV 107.3 (H) 03/17/2016   PLT 267 03/17/2016   Lab Results  Component Value Date   NA 136 03/17/2016   K 3.4 (L) 03/17/2016   CL 100 (L) 03/17/2016   CO2 27 03/17/2016   Lab Results  Component Value Date   ALT 34 03/17/2016   AST 47 (H) 03/17/2016   ALKPHOS 63 03/17/2016   BILITOT 0.7 03/17/2016      RADIOGRAPHY: Mr [name] KG Contrast  Result Date: 04/02/2016 CLINICAL DATA:  56 year old male with non-small cell, adenocarcinoma right lung metastatic to brain. The patient was treated with SRS to 10 lesions in September 2017. Subsequent encounter. EXAM: MRI HEAD WITHOUT AND WITH CONTRAST TECHNIQUE: Multiplanar, multiecho pulse sequences of the brain and surrounding structures were obtained without and with intravenous contrast. CONTRAST:  52m MULTIHANCE GADOBENATE DIMEGLUMINE 529 MG/ML IV  SOLN COMPARISON:  Post treatment scan 12/26/2015, and earlier. FINDINGS: Brain: Most of the 10 treated metastases as seen in August 2017 are no longer visible following contrast (annotated with circles on series 10). Two of the cerebellar and the right inferior frontal gyrus treated metastases remain faintly visible (on series 10, images 26 images 26, 49, and 78). The mild brain edema previously associated with the treated lesions is resolved. There are three small new brain metastases identified along the anterior right superior cerebellar surface on series 10, image 54, in the right temporal lobe on series 10, image 56, and in the medial anterior left frontal lobe on series 10, image 79. None of these have associated edema. Additionally, there is a sutble increasing area of enhancement at the dorsal cervicomedullary junction (series 10, image 21 and series 12, image 20) which seems to be brand new [facility] and is highly suspicious for small metastasis. No associated dorsal brainstem or upper spinal cord edema with this lesion. No dural thickening or leptomeningeal disease identified. No restricted diffusion to suggest acute infarction. No midline shift, mass effect, ventriculomegaly, extra-axial collection or acute intracranial hemorrhage. Cervicomedullary junction and pituitary are within normal limits. No chronic cerebral blood products. Stable small right cerebellar and medial right thalamic lacunar infarcts. Vascular: Major intracranial vascular flow voids are stable. Skull and upper cervical spine: Except for the dorsal cervicomedullary junction lesion described above the visible cervical spine and spinal cord are normal. Bone marrow signal is chronically heterogeneous but stable. Sinuses/Orbits: Orbits soft tissues are stable and within normal limits. Stable paranasal sinuses and mastoids, mild left mastoid effusion. Grossly normal visualized internal  auditory structures. Other: Resected right occipital region scalp mass. Regressed nasopharyngeal soft tissue mass or cyst since November. IMPRESSION: 1. Three definite new small brain metastases, plus 1 additional highly suspicious dorsal cervicomedullary junction area of enhancement. No associated edema or mass effect. These are annotated with double arrows on series 10. 2. All previously treated brain metastases are diminutive or resolved; three remain faintly visible following contrast. 3. Resected right posterior scalp mass and resolved or resected left nasopharyngeal lesion since August. Electronically Signed   By: HGenevie Ann  M.D.   On: 04/02/2016 11:36   Nm Pet Image Restag (ps) Skull Base To Thigh  Result Date: 03/13/2016 CLINICAL DATA:  Subsequent treatment strategy for right lung cancer originally diagnosed in 2016. EXAM: NUCLEAR MEDICINE PET SKULL BASE TO THIGH TECHNIQUE: 10.26 mCi F-18 FDG was injected intravenously. Full-ring PET imaging was performed from the skull base to thigh after the radiotracer. CT data was obtained and used for attenuation correction and anatomic localization. FASTING BLOOD GLUCOSE:  Value: 85 mg/dl COMPARISON:  PET-CT 01/06/2016 and 10/31/2015. Abdominopelvic CT 01/15/2016. FINDINGS: NECK There is a hypermetabolic 6 mm level VA node on the right (CT image 24, SUV max 5.7). No other hypermetabolic lymph nodes are seen within the neck. There is asymmetric muscular activity on the left. There is also mildly asymmetric activity within the left nasopharynx without corresponding CT abnormality. CHEST There are several small residual hypermetabolic lymph nodes within the superior mediastinum, involving the prevascular space, AP window and right paratracheal stations. These are similar to the prior study. Representative nodes include 1 in the pre-vascular space with an SUV max of 8.4 and 1 in the right paratracheal space with an SUV max of 9.8. There is new hypermetabolic subcarinal  lymphadenopathy within SUV max of 9.5. This node has a short axis dimension of 11 mm on CT image 71. No other new or enlarging lymph nodes are seen. There has been further retraction of the dominant right lower lobe mass which now measures 5.6 x 5.5 cm on image 44 (previously 5.9 x 5.6 cm). This lesion demonstrates central necrosis, devoid of metabolic activity. There is peripheral hypermetabolic activity, especially medially (SUV max 11.1, previously 9.2). Patchy airspace opacities throughout the right lung are stable, without additional hypermetabolic activity. The left lung is clear. ABDOMEN/PELVIS No residual adrenal mass is seen, although there is asymmetric hypermetabolic activity on the right (SUV max 10.9). This has increased compared with the prior study (previously 5.6). There is no abnormal activity within the liver, spleen or pancreas. There is no hypermetabolic nodal activity. There is no focal hypermetabolic activity within the bowel. However, there is some wall thickening in the sigmoid colon and a small amount of pelvic ascites. SKELETON No new osseous lesions are seen. There is persistent hypermetabolic activity within the left femoral neck (8.2, previously 5.7). No enlarging lytic lesion or evidence of pathologic fracture. There is persistent hypermetabolic activity within the right lateral fifth rib fracture. IMPRESSION: 1. Mixed response within the chest. The dominant right lower lobe mass demonstrates continued retraction with central necrosis, although shows mildly increased peripheral hypermetabolic activity medially. 2. Most of the mediastinal lymph nodes are unchanged, although there is new subcarinal and right cervical hypermetabolic adenopathy suspicious for progressive disease. 3. Increased metabolic activity within the right adrenal gland without enlarging nodule. 4. Persistent hypermetabolic activity in the left femoral neck, mildly increased compared with previous study. No new lesions  are seen. 5. Persistent sigmoid colon wall thickening suspicious for colitis. Electronically Signed   By: Richardean Sale M.D.   On: 03/13/2016 16:36       IMPRESSION/PLAN: 1. Stage IV T3 N2 M1 B non-small cell carcinoma, adenocarcinoma of the right lung. The patient is doing well overall and we reviewed his MRI results. There are three new brain lesions that Dr. Lisbeth Renshaw has recommended that we treat. The patient is interested in being aggressive and we discussed another course of SRS to these sites. The patient is counseled on the risks, benefits, short, and long term effects  of radiotherapy. We will plan simulation and coordination of his treatment in the next week. He states agreement and understanding, and will keep [place] informed of his progress and if he develops any neurologic symptoms.  In a visit lasting 25 minutes, greater than 50% of the time was spent face to face discussing his course of care, recommendations for treatment, and coordinating the patient's care.    Carola Rhine, PAC

## 2016-04-07 ENCOUNTER — Encounter (HOSPITAL_COMMUNITY): Payer: BLUE CROSS/BLUE SHIELD | Attending: Hematology & Oncology

## 2016-04-07 VITALS — BP 126/88 | HR 83 | Temp 96.6°F | Resp 20 | Wt 206.4 lb

## 2016-04-07 DIAGNOSIS — Z5189 Encounter for other specified aftercare: Secondary | ICD-10-CM | POA: Diagnosis not present

## 2016-04-07 DIAGNOSIS — C7951 Secondary malignant neoplasm of bone: Secondary | ICD-10-CM

## 2016-04-07 DIAGNOSIS — C7931 Secondary malignant neoplasm of brain: Secondary | ICD-10-CM

## 2016-04-07 DIAGNOSIS — C3431 Malignant neoplasm of lower lobe, right bronchus or lung: Secondary | ICD-10-CM | POA: Diagnosis not present

## 2016-04-07 DIAGNOSIS — C3491 Malignant neoplasm of unspecified part of right bronchus or lung: Secondary | ICD-10-CM | POA: Insufficient documentation

## 2016-04-07 DIAGNOSIS — Z5111 Encounter for antineoplastic chemotherapy: Secondary | ICD-10-CM

## 2016-04-07 DIAGNOSIS — Z5112 Encounter for antineoplastic immunotherapy: Secondary | ICD-10-CM | POA: Diagnosis not present

## 2016-04-07 LAB — CBC WITH DIFFERENTIAL/PLATELET
BASOS PCT: 0 %
Basophils Absolute: 0 10*3/uL (ref 0.0–0.1)
EOS ABS: 0 10*3/uL (ref 0.0–0.7)
Eosinophils Relative: 0 %
HCT: 41.7 % (ref 39.0–52.0)
Hemoglobin: 13.9 g/dL (ref 13.0–17.0)
Lymphocytes Relative: 13 %
Lymphs Abs: 1.1 10*3/uL (ref 0.7–4.0)
MCH: 35.1 pg — ABNORMAL HIGH (ref 26.0–34.0)
MCHC: 33.3 g/dL (ref 30.0–36.0)
MCV: 105.3 fL — ABNORMAL HIGH (ref 78.0–100.0)
MONO ABS: 0.2 10*3/uL (ref 0.1–1.0)
MONOS PCT: 2 %
Neutro Abs: 7.1 10*3/uL (ref 1.7–7.7)
Neutrophils Relative %: 85 %
Platelets: 383 10*3/uL (ref 150–400)
RBC: 3.96 MIL/uL — ABNORMAL LOW (ref 4.22–5.81)
RDW: 16.2 % — AB (ref 11.5–15.5)
WBC: 8.4 10*3/uL (ref 4.0–10.5)

## 2016-04-07 LAB — COMPREHENSIVE METABOLIC PANEL
ALBUMIN: 3.1 g/dL — AB (ref 3.5–5.0)
ALT: 19 U/L (ref 17–63)
AST: 35 U/L (ref 15–41)
Alkaline Phosphatase: 77 U/L (ref 38–126)
Anion gap: 11 (ref 5–15)
BUN: 11 mg/dL (ref 6–20)
CO2: 25 mmol/L (ref 22–32)
Calcium: 8.6 mg/dL — ABNORMAL LOW (ref 8.9–10.3)
Chloride: 101 mmol/L (ref 101–111)
Creatinine, Ser: 0.8 mg/dL (ref 0.61–1.24)
GFR calc Af Amer: >60 mL/min (ref >60)
GFR calc non Af Amer: >60 mL/min (ref >60)
GLUCOSE: 171 mg/dL — AB (ref 65–99)
POTASSIUM: 3 mmol/L — AB (ref 3.5–5.1)
SODIUM: 137 mmol/L (ref 135–145)
Total Bilirubin: 0.7 mg/dL (ref 0.3–1.2)
Total Protein: 6.3 g/dL — ABNORMAL LOW (ref 6.5–8.1)

## 2016-04-07 MED ORDER — SODIUM CHLORIDE 0.9 % IV SOLN
8.9000 mg/kg | Freq: Once | INTRAVENOUS | Status: AC
  Administered 2016-04-07: 1000 mg via INTRAVENOUS
  Filled 2016-04-07: qty 100

## 2016-04-07 MED ORDER — SODIUM CHLORIDE 0.9 % IV SOLN
Freq: Once | INTRAVENOUS | Status: AC
  Administered 2016-04-07: 11:00:00 via INTRAVENOUS

## 2016-04-07 MED ORDER — DEXAMETHASONE SODIUM PHOSPHATE 10 MG/ML IJ SOLN
10.0000 mg | Freq: Once | INTRAMUSCULAR | Status: AC
Start: 2016-04-07 — End: 2016-04-07
  Administered 2016-04-07: 10 mg via INTRAVENOUS
  Filled 2016-04-07: qty 1

## 2016-04-07 MED ORDER — PEGFILGRASTIM 6 MG/0.6ML ~~LOC~~ PSKT
6.0000 mg | PREFILLED_SYRINGE | Freq: Once | SUBCUTANEOUS | Status: AC
  Administered 2016-04-07: 6 mg via SUBCUTANEOUS
  Filled 2016-04-07: qty 0.6

## 2016-04-07 MED ORDER — HEPARIN SOD (PORK) LOCK FLUSH 100 UNIT/ML IV SOLN
500.0000 [IU] | Freq: Once | INTRAVENOUS | Status: AC | PRN
  Administered 2016-04-07: 500 [IU]
  Filled 2016-04-07: qty 5

## 2016-04-07 MED ORDER — DIPHENHYDRAMINE HCL 50 MG/ML IJ SOLN
50.0000 mg | Freq: Once | INTRAMUSCULAR | Status: AC
  Administered 2016-04-07: 50 mg via INTRAVENOUS
  Filled 2016-04-07: qty 1

## 2016-04-07 MED ORDER — ACETAMINOPHEN 325 MG PO TABS
650.0000 mg | ORAL_TABLET | Freq: Once | ORAL | Status: AC
  Administered 2016-04-07: 650 mg via ORAL
  Filled 2016-04-07: qty 2

## 2016-04-07 MED ORDER — DOCETAXEL CHEMO INJECTION 160 MG/16ML
75.0000 mg/m2 | Freq: Once | INTRAVENOUS | Status: AC
  Administered 2016-04-07: 170 mg via INTRAVENOUS
  Filled 2016-04-07: qty 17

## 2016-04-07 MED ORDER — POTASSIUM CHLORIDE CRYS ER 20 MEQ PO TBCR
60.0000 meq | EXTENDED_RELEASE_TABLET | Freq: Once | ORAL | Status: AC
  Administered 2016-04-07: 60 meq via ORAL
  Filled 2016-04-07: qty 3

## 2016-04-07 NOTE — Progress Notes (Signed)
Corrected calcium 8.88 per B.[place], do not give xgeva today.  Tolerated chemo well. Stable and ambulatory on discharge home with family.

## 2016-04-07 NOTE — Patient Instructions (Signed)
[facility] Discharge Instructions for Patients Receiving Chemotherapy   Beginning January 23rd 2017 lab work for the [facility] will be done in the  Main lab at [facility] on 1st floor. If you have a lab appointment with the [place] please come in thru the  Main Entrance and check in at the main information desk   Today you received the following chemotherapy agents cyramza and taxotere. Xgeva not given, calcium was low. Neulasta device on left upper arm. Will dispense medication between 415 pm and 515 pm tomorrow. You may remove device after 515 pm tomorrow Wednesday March 14.  To help prevent nausea and vomiting after your treatment, we encourage you to take your nausea medication as instructed.   If you develop nausea and vomiting, or diarrhea that is not controlled by your medication, call the clinic.  The clinic phone number is [phone]. Office hours are Monday-Friday 8:30am-5:00pm.  BELOW ARE SYMPTOMS THAT SHOULD BE REPORTED IMMEDIATELY:  *FEVER GREATER THAN 101.0 F  *CHILLS WITH OR WITHOUT FEVER  NAUSEA AND VOMITING THAT IS NOT CONTROLLED WITH YOUR NAUSEA MEDICATION  *UNUSUAL SHORTNESS OF BREATH  *UNUSUAL BRUISING OR BLEEDING  TENDERNESS IN MOUTH AND THROAT WITH OR WITHOUT PRESENCE OF ULCERS  *URINARY PROBLEMS  *BOWEL PROBLEMS  UNUSUAL RASH Items with * indicate a potential emergency and should be followed up as soon as possible. If you have an emergency after office hours please contact your primary care physician or go to the nearest emergency department.  Please call the clinic during office hours if you have any questions or concerns.   You may also contact the Patient Navigator at [phone] should you have any questions or need assistance in obtaining follow up care.      Resources For Cancer Patients and their Caregivers ? American Cancer Society: Can assist with transportation, wigs, general needs, runs Look Good Feel  Better.        832-024-2895 ? Cancer Care: Provides financial assistance, online support groups, medication/co-pay assistance.  1-800-813-HOPE 2197524473) ? [place] Assists [place] cancer patients and their families through emotional , educational and financial support.  (613)638-8304 ? [place] DSS Where to apply for food stamps, Medicaid and utility assistance. 479-498-9782 ? [facility]: Transportation to medical appointments. 706-874-9788 ? Social Security Administration: May apply for disability if have a Stage IV cancer. 773-554-2527 404-155-8503 ? [employer], Disability and Transit Services: Assists with nutrition, care and transit needs. [phone]

## 2016-04-08 ENCOUNTER — Ambulatory Visit
Admission: RE | Admit: 2016-04-08 | Discharge: 2016-04-08 | Disposition: A | Discharge status: Home / Self Care | Payer: BLUE CROSS/BLUE SHIELD | Source: Ambulatory Visit | Attending: Radiation Oncology | Admitting: Radiation Oncology

## 2016-04-08 VITALS — BP 134/102 | HR 102 | Temp 97.6°F | Resp 20 | Ht 68.0 in | Wt 209.8 lb

## 2016-04-08 DIAGNOSIS — Z85118 Personal history of other malignant neoplasm of bronchus and lung: Secondary | ICD-10-CM | POA: Diagnosis not present

## 2016-04-08 DIAGNOSIS — Z79899 Other long term (current) drug therapy: Secondary | ICD-10-CM | POA: Diagnosis not present

## 2016-04-08 DIAGNOSIS — C7931 Secondary malignant neoplasm of brain: Secondary | ICD-10-CM

## 2016-04-08 DIAGNOSIS — Z923 Personal history of irradiation: Secondary | ICD-10-CM | POA: Insufficient documentation

## 2016-04-08 DIAGNOSIS — R234 Changes in skin texture: Secondary | ICD-10-CM | POA: Insufficient documentation

## 2016-04-08 DIAGNOSIS — Z510 Encounter for antineoplastic radiation therapy: Secondary | ICD-10-CM | POA: Diagnosis present

## 2016-04-08 MED ORDER — SODIUM CHLORIDE 0.9% FLUSH
10.0000 mL | Freq: Once | INTRAVENOUS | Status: AC
  Administered 2016-04-08: 10 mL via INTRAVENOUS

## 2016-04-08 MED ORDER — SODIUM CHLORIDE 0.9% FLUSH
10.0000 mL | Freq: Once | INTRAVENOUS | Status: AC
Start: 2016-04-08 — End: 2016-04-08
  Administered 2016-04-08: 10 mL via INTRAVENOUS

## 2016-04-08 MED ORDER — HEPARIN SOD (PORK) LOCK FLUSH 100 UNIT/ML IV SOLN
500.0000 [IU] | Freq: Once | INTRAVENOUS | Status: AC
  Administered 2016-04-08: 500 [IU] via INTRAVENOUS

## 2016-04-08 NOTE — Progress Notes (Addendum)
Has armband been applied?  Yes.  , patiet styated name and dob as identification  Does patient have an allergy to IV contrast dye?  NO   Has patient ever received premedication for IV contrast dye?: No.   Does patient take metformin?: No.  If patient does take metformin when was the last dose: Not diabetic  Date of lab work: { 04/07/2016 BUN: 11 CR: 0.80  IV site: right port a cath, using sterile technique accessed right port a cath, using huber needle#[id number]  1 inch, x 1 attempt,   excellent blood return, flushed with 10 ml normal saline, op site placed over site, clamped port, patient tolerated well notified  Katie CT RT therapist 2:05 PM    Has IV site been added to flowsheet?  Yes.    BP (!) 134/102 (BP Location: Right Arm, Patient Position: Sitting, Cuff Size: Normal)   Pulse (!) 102   Temp 97.6 F (36.4 C) (Oral)   Resp 20   Ht '5\' 8"'$  (1.727 m)   Wt 209 lb 12.8 oz (95.2 kg)   SpO2 100%   BMI 31.90 kg/m 2  Flushed patient right port a cath with 92m normal saline, then 5oo/u/ml heparin flush x 1, blood return shown between flushes, deaccessed  Catheter and removed,  bandaid applied over site, patient tolerated well no pain 3:23 PM

## 2016-04-13 ENCOUNTER — Encounter (HOSPITAL_COMMUNITY): Payer: Self-pay | Admitting: Oncology

## 2016-04-13 ENCOUNTER — Other Ambulatory Visit (HOSPITAL_COMMUNITY): Payer: Self-pay | Admitting: Oncology

## 2016-04-13 NOTE — Assessment & Plan Note (Addendum)
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  On Xgeva monthly to reduce risk for SRE.  Xgeva due with next cycle of chemotherapy.

## 2016-04-13 NOTE — Assessment & Plan Note (Deleted)
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  Treated by Dr. Tressie Stalker at [facility] with Carboplatin/Pemetrexed every 21 days x 6 cycles (12/10/2014- 03/25/2015) with transition to maintenance Pemetrexed every 21 days with last dose being administered on 08/01/2015. Imaging demonstrated progression of disease, resulting in a change in therapy to [place] beginning on 08/29/2015- 10/24/2015.  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 Lisbeth Renshaw. PET scan on 10/31/2015 demonstrated progression of disease resulting in a change in therapy to Docetaxel and Ramucirumab beginning on 11/07/2015.  Restaging PET scan on 03/13/2016 demonstrated a mixed response.  Treatment continues at the patient's request with no treatment options moving forward.  Oncology history is updated.   PET and CT imaging.  I suspect PET imaging will require peer to peer conversation prior to approval with his insurance company.    Return in 3 weeks for follow-up and cycle #6 of chemotherapy.

## 2016-04-13 NOTE — Assessment & Plan Note (Signed)
Patient was seen by Dr. Gala Romney and was found to have a large pedunculated polyp in the distal descending colon, engorged hemorrhoids, and localized proctocolitis limited to the distal colon/rectum. Biopsies confirmed active colitis. Dr. Coralee North questions whether the patient may have quiescent baseline IBD. He recommend to start patient on cortenemas ('100mg'$  hydrocortisone rentention enemas one daily at bedtime and retained for 30 min), 1 week prior to next chemotherapy and continued for a month afterwards.

## 2016-04-13 NOTE — Assessment & Plan Note (Addendum)
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  Treated by Dr. Tressie Stalker at [facility] with Carboplatin/Pemetrexed every 21 days x 6 cycles (12/10/2014- 03/25/2015) with transition to maintenance Pemetrexed every 21 days with last dose being administered on 08/01/2015. Imaging demonstrated progression of disease, resulting in a change in therapy to [place] beginning on 08/29/2015- 10/24/2015.  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 Lisbeth Renshaw. PET scan on 10/31/2015 demonstrated progression of disease resulting in a change in therapy to Docetaxel and Ramucirumab beginning on 11/07/2015.  Restaging PET scan on 03/13/2016 demonstrated a mixed response.  Treatment continues at the patient's request with no treatment options moving forward.  Oncology history is updated.  Pre-treatment labs ordered for next treatment: CBC diff, CMET, TSH.  I personally reviewed and went over laboratory results with the patient.  The results are noted within this dictation.    We will need to continue to follow TSH given his previous Opdivo treatment.  TSH is ordered for 3 weeks.  Limited CODE STATUS confirmed today.  Orders are placed accordingly.  I recommended to increase H2O intake.  He is asymptomatic of hypotension and tachycardia.  He denies any chest pain.  Restaging is due in May 2018.  PET is preferred due to size and hypermetabolic activity of primary tumor when comparing past PET and CT imaging.  I suspect PET imaging will require peer to peer conversation prior to approval with his insurance company.   PET scan is ordered.  Return in 2 weeks for next cycle of chemotherapy.

## 2016-04-14 ENCOUNTER — Encounter (HOSPITAL_BASED_OUTPATIENT_CLINIC_OR_DEPARTMENT_OTHER): Payer: BLUE CROSS/BLUE SHIELD | Admitting: Oncology

## 2016-04-14 ENCOUNTER — Encounter (HOSPITAL_COMMUNITY): Payer: Self-pay | Admitting: Oncology

## 2016-04-14 VITALS — BP 85/67 | HR 120 | Temp 98.4°F | Resp 20 | Wt 201.0 lb

## 2016-04-14 DIAGNOSIS — C3491 Malignant neoplasm of unspecified part of right bronchus or lung: Secondary | ICD-10-CM

## 2016-04-14 DIAGNOSIS — C7951 Secondary malignant neoplasm of bone: Secondary | ICD-10-CM | POA: Diagnosis not present

## 2016-04-14 DIAGNOSIS — K529 Noninfective gastroenteritis and colitis, unspecified: Secondary | ICD-10-CM

## 2016-04-14 NOTE — Patient Instructions (Addendum)
[place] [facility] Discharge Instructions  RECOMMENDATIONS MADE BY THE CONSULTANT AND ANY TEST RESULTS WILL BE SENT TO YOUR REFERRING PHYSICIAN.  You were seen today by [facility] in 2 weeks with labwork before PET scan in May Follow up in 2 or 5 weeks See Amy up front for appointments   Thank you for choosing [place] at [facility] to provide your oncology and hematology care.  To afford each patient quality time with our provider, please arrive at least 15 minutes before your scheduled appointment time.    If you have a lab appointment with the [facility] please come in thru the  Main Entrance and check in at the main information desk  You need to re-schedule your appointment should you arrive 10 or more minutes late.  We strive to give you quality time with our providers, and arriving late affects you and other patients whose appointments are after yours.  Also, if you no show three or more times for appointments you may be dismissed from the clinic at the providers discretion.     Again, thank you for choosing [facility].  Our hope is that these requests will decrease the amount of time that you wait before being seen by our physicians.       _____________________________________________________________  Should you have questions after your visit to [facility], please contact our office at [phone] between the hours of 8:30 a.m. and 4:30 p.m.  Voicemails left after 4:30 p.m. will not be returned until the following business day.  For prescription refill requests, have your pharmacy contact our office.       Resources For Cancer Patients and their Caregivers ? American Cancer Society: Can assist with transportation, wigs, general needs, runs Look Good Feel Better.        808-181-1350 ? Cancer Care: Provides financial assistance, online support groups, medication/co-pay  assistance.  1-800-813-HOPE 703-484-3619) ? [place] Assists [place] cancer patients and their families through emotional , educational and financial support.  502 112 0174 ? [place] DSS Where to apply for food stamps, Medicaid and utility assistance. 249-254-2050 ? [employer]: Transportation to medical appointments. (704)364-5306 ? Social Security Administration: May apply for disability if have a Stage IV cancer. 209-127-5479 8201487697 ? [employer], Disability and Transit Services: Assists with nutrition, care and transit needs. [place] Support Programs: '@10RELATIVEDAYS'$ @ > Cancer Support Group  2nd Tuesday of the month 1pm-2pm, Journey Room  > Creative Journey  3rd Tuesday of the month 1130am-1pm, Journey Room  > Look Good Feel Better  1st Wednesday of the month 10am-12 noon, Journey Room (Call [place] to register [phone])

## 2016-04-14 NOTE — Progress Notes (Signed)
[name], MD [place] [address]  Adenocarcinoma of right lung [facility]) - Plan: NM PET Image Restag (PS) Skull Base To Thigh, CBC with Differential, Comprehensive metabolic panel, TSH, Urinalysis, dipstick only, Partial code  Colitis  Bone metastasis (HCC)  CURRENT THERAPY: Docetaxel + Ramucirumab beginning on 11/07/2015  INTERVAL HISTORY: Paschal Dopp 56 y.o. 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 Treated by Dr. Tressie Stalker at [facility] with Carboplatin/Pemetrexed every 21 days x 6 cycles (12/10/2014- 03/25/2015) with transition to maintenance Pemetrexed every 21 days with last dose being administered on 08/01/2015. Imaging demonstrated progression of disease, resulting in a change in therapy to [place] beginning on 08/29/2015- 10/24/2015.  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 Lisbeth Renshaw. PET scan on 10/31/2015 demonstrated progression of disease resulting in a change in therapy to Docetaxel and Ramucirumab beginning on 11/07/2015.    Adenocarcinoma of right lung ([place])   11/20/2014 Procedure    Right scalp skin biopsy      11/20/2014 Pathology Results    Adenocarcinoma of lung primary. PDL-1 NEGATIVE (0% tumor proportion score); EGFR mutation NEGATIVE; no evidence of ALK gene rearrangement detected by FISH; no reaarnagement of ROS1 gene detected.      11/29/2014 PET scan    Large hypermetabolic mass within the RLL with central necrosis. Hypermetabolic right suprahilar mass with postobstructive collapse of the RUL. Subcarinal, paratracheal, prevascular and R internal mammary nodal metastases. Small R adrenal gland nodule.      12/10/2014 - 03/25/2015 Chemotherapy    Carboplatin and pemetrexed 6 cycles      02/11/2015 PET scan    Partial metabolic response to therapy, with decrease in hypermetabolic right lung masses, postobstructive right upper  lobe consolidation, thoracic lymphadenopathy and right adrenal metastasis. No new or progressive disease identified.      04/05/2015 PET scan    No significant change in hypermetabolic right lung masses and thoracic lymphadenopathy. No new or progressive disease identified.      04/15/2015 - 08/01/2015 Chemotherapy    Pemetrexed maintenance      08/14/2015 PET scan    Overall progression of metastatic lung cancer. Increasingly hypermetabolic mediastinal lymph nodes and R lung masses, enlarging R adrenal mass and new osseous lesions. Enlarging hypermetabolic R-sided scalp nodules.      08/14/2015 Progression    Progression of disease on PET imaging      08/29/2015 - 10/24/2015 Chemotherapy    Opdivo      08/29/2015 Miscellaneous    Xgeva started monthly      09/05/2015 Imaging    MRI brain- Multiple metastatic deposits in the brain. At least 9 lesions are identified. Mild edema around the 10 mm lesion in the right cerebellum.      09/27/2015 - 09/27/2015 Radiation Therapy    Dr. Lisbeth Renshaw- SRS to brain mets:  PTV target 1-10 were treated using 2 treatment plans. A single isocenter technique was utilized to treat 8 lesion simultaneously. PTV's 2 and 5 were then treated simultaneously with a second radiation treatment plan due to their close proximity. A prescription dose of 20 Gy was delivered to each lesion. ExacTrac Snap verification was performed for each couch angle.       10/31/2015 PET scan    1. Overall there has been interval progression of disease compared with the previous exam. 2. There is an  increased FDG uptake associated with the dominant mass involving the right lower lobe. Additionally, there are new hypermetabolic pre-vascular and right posterior cervical lymph nodes. 3. Significant interval increase in FDG uptake associated with right upper lobe pulmonary nodule and right adrenal gland metastases. 4. New hypermetabolic metastasis is involving the left hip. Additionally, there  is intense uptake associated with fractured right rib. Underlying lesion not excluded. 5. Increase in FDG uptake associated with lesion involving the posterior nasopharynx.      10/31/2015 Progression    Progression of disease      11/07/2015 Treatment Plan Change    D/C Opdivo and change to Docetaxel + Ramucirumab      11/07/2015 -  Chemotherapy    Docetaxel + Ramucirumab       01/06/2016 PET scan    1. Overall positive response to therapy with reduction in size and metabolic activity of metastatic lesions. 2. Decreased in the peripheral metabolic activity of the large RIGHT lower lobe mass. 3. Decrease in metabolic activity of metastatic mediastinal lymph nodes. 4. Decrease in metabolic activity of RIGHT adrenal gland metastasis. 5. Decreased metabolic activity skeletal metastasis      01/15/2016 - 01/19/2016 Hospital Admission    Admit date: 01/15/2016 Admission diagnosis: Rectal bleeding Additional comments: Colitis      01/15/2016 Imaging    CT abd/pelvis- 1. Bowel wall thickening involving the sigmoid colon and to a lesser extent descending colon concerning for colitis. 2. 5.4 cm right lower lobe pulmonary mass most consistent with primary lung malignancy. 3.  Aortic Atherosclerosis       03/13/2016 PET scan    1. Mixed response within the chest. The dominant right lower lobe mass demonstrates continued retraction with central necrosis, although shows mildly increased peripheral hypermetabolic activity medially. 2. Most of the mediastinal lymph nodes are unchanged, although there is new subcarinal and right cervical hypermetabolic adenopathy suspicious for progressive disease. 3. Increased metabolic activity within the right adrenal gland without enlarging nodule. 4. Persistent hypermetabolic activity in the left femoral neck, mildly increased compared with previous study. No new lesions are seen. 5. Persistent sigmoid colon wall thickening suspicious for  colitis.      04/14/2016 Code Status    Limited CODE STATUS.  NO INTUBATION.  All other interventions requested.       It is difficult to keep the patient on topic.  He notes that he is tolerating therapy well.  Weight loss is noted.  He is below baseline with regards to his blood pressure today.  He is hypotensive.  She is mildly tachycardic.  He denies any symptoms of this.  He denies any dizziness or loss of consciousness.  Encouraged increase H2O intake.  He notes that he only drinks carbonated beverages and discouraged this..  We discussed CODE STATUS.   no confusing manner, he reports wanting to be a limited code.  Orders placed for this.  Spent a lot of time discussing his family and joking.  He is tearful during discussion today.  His wife is admitted at [facility] with what sounds like a pleural effusion and possibly pneumonia.  Review of Systems  Constitutional: Positive for malaise/fatigue and weight loss. Negative for chills and fever.  HENT: Negative.   Eyes: Negative.   Respiratory: Negative.  Negative for cough.   Cardiovascular: Negative.  Negative for chest pain.  Gastrointestinal: Negative.  Negative for blood in stool, constipation, diarrhea, melena, nausea and vomiting.  Genitourinary: Negative.   Musculoskeletal: Negative.   Skin:  Negative.   Neurological: Negative.  Negative for dizziness, weakness and headaches.  Endo/Heme/Allergies: Negative.   Psychiatric/Behavioral: Negative.     Past Medical History:  Diagnosis Date  . Adenocarcinoma of right lung ([place]) 07/11/2015  . Bone metastasis ([place]) 08/24/2015  . Cirrhosis of liver ([place]) 08/14/2015   PET on 08/14/2015 demonstrates cirrhotic liver.   . Colitis 01/15/2016    Past Surgical History:  Procedure Laterality Date  . BIOPSY  03/09/2016   Procedure: BIOPSY;  Surgeon: Daneil Dolin, MD;  [facility];  Service: Endoscopy;;  sigmoid and rectal biopsies  . COLONOSCOPY WITH PROPOFOL  N/A 03/09/2016   Procedure: COLONOSCOPY WITH PROPOFOL;  Surgeon: Daneil Dolin, MD;  Location: [facility];  Service: Endoscopy;  Laterality: N/A;  100 - pt knows to arrive at 7:45  . POLYPECTOMY  03/09/2016   Procedure: POLYPECTOMY;  Surgeon: Daneil Dolin, MD;  Location: [facility];  Service: Endoscopy;;  at splenic flexure x2; distal descending colon  . right power port placement Right    right subclavian    Family History  Problem Relation Age of Onset  . Alzheimer's disease Mother 51  . CAD Mother   . Diabetes Mother   . Cancer Father     thyroid cancer  . CVA Father 33  . Lupus Sister   . Cancer Sister     metastatic ovarian  . Colon cancer Neg Hx     Social History   Social History  . Marital status: Married    Spouse name: N/A  . Number of children: N/A  . Years of education: N/A   Occupational History  . disabled    Social History Main Topics  . Smoking status: Current Every Day Smoker    Packs/day: 2.00  . Smokeless tobacco: Never Used  . Alcohol use No  . Drug use: No  . Sexual activity: Not Asked   Other Topics Concern  . None   Social History Narrative  . None     PHYSICAL EXAMINATION  ECOG PERFORMANCE STATUS: 1 - Symptomatic but completely ambulatory  Vitals:   04/14/16 0926  BP: (!) 85/67  Pulse: (!) 120  Resp: 20  Temp: 98.4 F (36.9 C)     GENERAL:alert, no distress, well nourished, well developed, comfortable, cooperative, smiling, tearful at times and accompanied by sister. SKIN: skin color, texture, turgor are normal, no rashes or significant lesions, dry skin. HEAD: Normocephalic, No masses, lesions, tenderness or abnormalities.  EYES: normal, EOMI, Conjunctiva are pink and non-injected EARS: External ears normal OROPHARYNX:lips, buccal mucosa, and tongue normal and mucous membranes are moist  NECK: supple, trachea midline LYMPH:  no palpable lymphadenopathy BREAST:not examined LUNGS: clear to auscultation with decreased  breath sounds bilaterally. HEART: regular rate & rhythm ABDOMEN:abdomen soft, obese and normal bowel sounds BACK: Back symmetric, no curvature. EXTREMITIES:less then 2 second capillary refill, no joint deformities, effusion, or inflammation, no skin discoloration, no cyanosis, no edema. NEURO: alert & oriented x 3 with fluent speech, no focal motor/sensory deficits, in wheelchair.   LABORATORY DATA: CBC    Component Value Date/Time   WBC 8.4 04/07/2016 0933   RBC 3.96 (L) 04/07/2016 0933   HGB 13.9 04/07/2016 0933   HCT 41.7 04/07/2016 0933   PLT 383 04/07/2016 0933   MCV 105.3 (H) 04/07/2016 0933   MCH 35.1 (H) 04/07/2016 0933   MCHC 33.3 04/07/2016 0933   RDW 16.2 (H) 04/07/2016 0933   LYMPHSABS 1.1 04/07/2016 0933  MONOABS 0.2 04/07/2016 0933   EOSABS 0.0 04/07/2016 0933   BASOSABS 0.0 04/07/2016 0933      Chemistry      Component Value Date/Time   NA 137 04/07/2016 0933   K 3.0 (L) 04/07/2016 0933   CL 101 04/07/2016 0933   CO2 25 04/07/2016 0933   BUN 11 04/07/2016 0933   CREATININE 0.80 04/07/2016 0933      Component Value Date/Time   CALCIUM 8.6 (L) 04/07/2016 0933   ALKPHOS 77 04/07/2016 0933   AST 35 04/07/2016 0933   ALT 19 04/07/2016 0933   BILITOT 0.7 04/07/2016 0933        PENDING LABS:   RADIOGRAPHIC STUDIES:  Mr [name] LK Contrast  Result Date: 04/02/2016 CLINICAL DATA:  56 year old male with non-small cell, adenocarcinoma right lung metastatic to brain. The patient was treated with SRS to 10 lesions in September 2017. Subsequent encounter. EXAM: MRI HEAD WITHOUT AND WITH CONTRAST TECHNIQUE: Multiplanar, multiecho pulse sequences of the brain and surrounding structures were obtained without and with intravenous contrast. CONTRAST:  33m MULTIHANCE GADOBENATE DIMEGLUMINE 529 MG/ML IV SOLN COMPARISON:  Post treatment scan 12/26/2015, and earlier. FINDINGS: Brain: Most of the 10 treated metastases as seen in August 2017 are no longer visible  following contrast (annotated with circles on series 10). Two of the cerebellar and the right inferior frontal gyrus treated metastases remain faintly visible (on series 10, images 26 images 26, 49, and 78). The mild brain edema previously associated with the treated lesions is resolved. There are three small new brain metastases identified along the anterior right superior cerebellar surface on series 10, image 54, in the right temporal lobe on series 10, image 56, and in the medial anterior left frontal lobe on series 10, image 79. None of these have associated edema. Additionally, there is a sutble increasing area of enhancement at the dorsal cervicomedullary junction (series 10, image 21 and series 12, image 20) which seems to be brand new compared to a 11/26/2014 [facility] MRI and is highly suspicious for small metastasis. No associated dorsal brainstem or upper spinal cord edema with this lesion. No dural thickening or leptomeningeal disease identified. No restricted diffusion to suggest acute infarction. No midline shift, mass effect, ventriculomegaly, extra-axial collection or acute intracranial hemorrhage. Cervicomedullary junction and pituitary are within normal limits. No chronic cerebral blood products. Stable small right cerebellar and medial right thalamic lacunar infarcts. Vascular: Major intracranial vascular flow voids are stable. Skull and upper cervical spine: Except for the dorsal cervicomedullary junction lesion described above the visible cervical spine and spinal cord are normal. Bone marrow signal is chronically heterogeneous but stable. Sinuses/Orbits: Orbits soft tissues are stable and within normal limits. Stable paranasal sinuses and mastoids, mild left mastoid effusion. Grossly normal visualized internal auditory structures. Other: Resected right occipital region scalp mass. Regressed nasopharyngeal soft tissue mass or cyst since November. IMPRESSION: 1. Three  definite new small brain metastases, plus 1 additional highly suspicious dorsal cervicomedullary junction area of enhancement. No associated edema or mass effect. These are annotated with double arrows on series 10. 2. All previously treated brain metastases are diminutive or resolved; three remain faintly visible following contrast. 3. Resected right posterior scalp mass and resolved or resected left nasopharyngeal lesion since August. Electronically Signed   By: HGenevie AnnM.D.   On: 04/02/2016 11:36     PATHOLOGY:    ASSESSMENT AND PLAN:  Adenocarcinoma of right lung (HCC) Stage IV adenocarcinoma of right  lung with metastatic disease, biopsy proven on scalp lesion excision on right parietal area.  Treated by Dr. Tressie Stalker at [facility] with Carboplatin/Pemetrexed every 21 days x 6 cycles (12/10/2014- 03/25/2015) with transition to maintenance Pemetrexed every 21 days with last dose being administered on 08/01/2015. Imaging demonstrated progression of disease, resulting in a change in therapy to [place] beginning on 08/29/2015- 10/24/2015.  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 Lisbeth Renshaw. PET scan on 10/31/2015 demonstrated progression of disease resulting in a change in therapy to Docetaxel and Ramucirumab beginning on 11/07/2015.  Restaging PET scan on 03/13/2016 demonstrated a mixed response.  Treatment continues at the patient's request with no treatment options moving forward.  Oncology history is updated.  Pre-treatment labs ordered for next treatment: CBC diff, CMET, TSH.  I personally reviewed and went over laboratory results with the patient.  The results are noted within this dictation.    We will need to continue to follow TSH given his previous Opdivo treatment.  TSH is ordered for 3 weeks.  Limited CODE STATUS confirmed today.  Orders are placed accordingly.  I recommended to increase H2O intake.  He is asymptomatic of hypotension and tachycardia.  He denies any chest pain.  Restaging is due in May  2018.  PET is preferred due to size and hypermetabolic activity of primary tumor when comparing past PET and CT imaging.  I suspect PET imaging will require peer to peer conversation prior to approval with his insurance company.   PET scan is ordered.  Return in 2 weeks for next cycle of chemotherapy.    Colitis Patient was seen by Dr. Gala Romney and was found to have a large pedunculated polyp in the distal descending colon, engorged hemorrhoids, and localized proctocolitis limited to the distal colon/rectum. Biopsies confirmed active colitis. Dr. Coralee North questions whether the patient may have quiescent baseline IBD. He recommend to start patient on cortenemas (13m hydrocortisone rentention enemas one daily at bedtime and retained for 30 min), 1 week prior to next chemotherapy and continued for a month afterwards.  Bone metastasis ([place] metastases from NSCLC.  On Xgeva monthly to reduce risk for SRE.  Xgeva due with next cycle of chemotherapy.   ORDERS PLACED FOR THIS ENCOUNTER: Orders Placed This Encounter  Procedures  . NM PET Image Restag (PS) Skull Base To Thigh  . CBC with Differential  . Comprehensive metabolic panel  . TSH  . Urinalysis, dipstick only  . Partial code    MEDICATIONS PRESCRIBED THIS ENCOUNTER: Meds ordered this encounter  Medications  . dexamethasone (DECADRON) 4 MG tablet    Refill:  0    THERAPY PLAN:  Continue treatment as outlined above: Docetaxel + Ramucirumab  All questions were answered. The patient knows to call the clinic with any problems, questions or concerns. We can certainly see the patient much sooner if necessary.  Patient and plan discussed with Dr. LTwana Firstand she is in agreement with the aforementioned.   This note is electronically signed by: KDoy Mince3/20/2018 12:19 PM

## 2016-04-16 ENCOUNTER — Other Ambulatory Visit (HOSPITAL_COMMUNITY): Payer: Self-pay | Admitting: Hematology & Oncology

## 2016-04-16 ENCOUNTER — Other Ambulatory Visit (HOSPITAL_COMMUNITY): Payer: Self-pay | Admitting: Oncology

## 2016-04-16 DIAGNOSIS — C3491 Malignant neoplasm of unspecified part of right bronchus or lung: Secondary | ICD-10-CM

## 2016-04-16 DIAGNOSIS — C7931 Secondary malignant neoplasm of brain: Secondary | ICD-10-CM

## 2016-04-16 DIAGNOSIS — C7951 Secondary malignant neoplasm of bone: Secondary | ICD-10-CM

## 2016-04-17 DIAGNOSIS — Z510 Encounter for antineoplastic radiation therapy: Secondary | ICD-10-CM | POA: Diagnosis not present

## 2016-04-22 ENCOUNTER — Encounter: Payer: Self-pay | Admitting: Radiation Oncology

## 2016-04-22 ENCOUNTER — Ambulatory Visit
Admission: RE | Admit: 2016-04-22 | Discharge: 2016-04-22 | Disposition: A | Discharge status: Home / Self Care | Payer: BLUE CROSS/BLUE SHIELD | Source: Ambulatory Visit | Attending: Radiation Oncology | Admitting: Radiation Oncology

## 2016-04-22 VITALS — BP 103/82 | HR 71 | Temp 98.3°F

## 2016-04-22 DIAGNOSIS — C7931 Secondary malignant neoplasm of brain: Secondary | ICD-10-CM

## 2016-04-22 NOTE — Progress Notes (Signed)
Patient denies having a headache, nausea or new vision changes.  He is here with his sister and call light is in reach.  Will continue to monitor.

## 2016-04-22 NOTE — Progress Notes (Signed)
Patient discharged from the clinic in a wheelchair accompanied by his sister.

## 2016-04-22 NOTE — Op Note (Signed)
  Name: Carl Simmons  MRN: [id number]  Date: 04/22/2016   DOB: 06-Jul-1960  Stereotactic Radiosurgery Operative Note  PRE-OPERATIVE DIAGNOSIS:  Multiple Brain Metastases  POST-OPERATIVE DIAGNOSIS:  Multiple Brain Metastases  PROCEDURE:  Stereotactic Radiosurgery  SURGEON:  Peggyann Shoals, MD  NARRATIVE: The patient underwent a radiation treatment planning session in the radiation oncology simulation suite under the care of the radiation oncology physician and physicist.  I participated closely in the radiation treatment planning afterwards. The patient underwent planning CT which was fused to 3T high resolution MRI with 1 mm axial slices.  These images were fused on the planning system.  We contoured the gross target volumes and subsequently expanded this to yield the Planning Target Volume. I actively participated in the planning process.  I helped to define and review the target contours and also the contours of the optic pathway, eyes, brainstem and selected nearby organs at risk.  All the dose constraints for critical structures were reviewed and compared to AAPM Task Group 101.  The prescription dose conformity was reviewed.  I approved the plan electronically.    Accordingly, Paschal Dopp was brought to the TrueBeam stereotactic radiation treatment linac and placed in the custom immobilization mask.  The patient was aligned according to the IR fiducial markers with BrainLab Exactrac, then orthogonal x-rays were used in ExacTrac with the 6DOF robotic table and the shifts were made to align the patient  Paschal Dopp received stereotactic radiosurgery uneventfully.    Lesions treated:  4   Complex lesions treated:  1 (>3.5 cm, <44m of optic path, or within the brainstem)   The detailed description of the procedure is recorded in the radiation oncology procedure note.  I was present for the duration of the procedure.  DISPOSITION:  Following delivery, the patient was transported to  nursing in stable condition and monitored for possible acute effects to be discharged to home in stable condition with follow-up in one month.  SPeggyann Shoals MD 04/22/2016 5:06 PM

## 2016-04-23 NOTE — Progress Notes (Signed)
  Radiation Oncology         [phone] ________________________________  Name: Carl Simmons MRN: [id number]  Date: 04/22/2016  DOB: [birth year]  End of Treatment Note  Diagnosis:  Stage IV (T3 N2 M1b) non-small cell carcinoma, adenocarcinoma of the right lung  Indication for treatment:  Palliative  Radiation treatment dates:   04/22/16  Site/dose:  1) PTV11 Brainstem 61m: 14 Gy in 1 fraction 2) PTV12 Right Cerebellum 525m 20 Gy in 1 fraction 3) PTV13 Right Temporal 35m72m20 Gy in 1 fraction 4) PTV14 Left Frontal 4mm28m0 Gy in 1 fraction  Beams/energy:    1) SBRT/SRT-3D // 6FFF Photon 2) SBRT/SRT-3D // 6FFF Photon 3) SBRT/SRT-3D // 6FFF Photon 4) SBRT/SRT-3D // 6FFF Photon  Narrative: The patient tolerated radiation treatment relatively well.     Plan: The patient has completed radiation treatment. The patient will return to radiation oncology clinic for routine followup in one month. I advised them to call or return sooner if they have any questions or concerns related to their recovery or treatment.  ------------------------------------------------  JohnJodelle Gross, PhD  This document serves as a record of services personally performed by JohnKyung Rudd. It was created on his behalf by JalaDarcus Austintrained medical scribe. The creation of this record is based on the scribe's personal observations and the provider's statements to them. This document has been checked and approved by the attending provider.

## 2016-04-24 NOTE — Progress Notes (Signed)
  Radiation Oncology         [phone] ________________________________  Name: Carl Simmons MRN: [id number]  Date: 04/08/2016  DOB: 10-02-1960  DIAGNOSIS:     ICD-9-CM ICD-10-CM   1. Brain metastases ([place]) 198.3 C79.31     NARRATIVE:  The patient was brought to the [facility].  Identity was confirmed.  All relevant records and images related to the planned course of therapy were reviewed.  The patient freely provided informed written consent to proceed with treatment after reviewing the details related to the planned course of therapy. The consent form was witnessed and verified by the simulation staff. Intravenous access was established for contrast administration. Then, the patient was set-up in a stable reproducible supine position for radiation therapy.  A relocatable thermoplastic stereotactic head frame was fabricated for precise immobilization.  CT images were obtained.  Surface markings were placed.  The CT images were loaded into the planning software and fused with the patient's targeting MRI scan.  Then the target and avoidance structures were contoured.  Treatment planning then occurred.  The radiation prescription was entered and confirmed.  I have requested 3D planning  I have requested a DVH of the following structures: Brain stem, brain, left eye, right eye, lenses, optic chiasm, target volumes, uninvolved brain, and normal tissue.    SPECIAL TREATMENT PROCEDURE:  The planned course of therapy using radiation constitutes a special treatment procedure. Special care is required in the management of this patient for the following reasons. This treatment constitutes a Special Treatment Procedure for the following reason: High dose per fraction requiring special monitoring for increased toxicities of treatment including daily imaging.  The special nature of the planned course of radiotherapy will require increased physician supervision and oversight to ensure patient's  safety with optimal treatment outcomes.  PLAN:  The patient will receive 14 Gy in 1 fraction to the brainstem lesion and 20 gray in 1 fraction to the 3 additional metastases.   ------------------------------------------------  Jodelle Gross, MD, PhD

## 2016-04-24 NOTE — Progress Notes (Signed)
  Radiation Oncology         [phone] ________________________________  Name: JOANNE BRANDER MRN: [id number]  Date: 04/22/2016  DOB: 1960/04/16   SPECIAL TREATMENT PROCEDURE   3D TREATMENT PLANNING AND DOSIMETRY: The patient's radiation plan was reviewed and approved by Dr. Vertell Limber from neurosurgery and radiation oncology prior to treatment. It showed 3-dimensional radiation distributions overlaid onto the planning CT/MRI image set. The [facility] for the target structures as well as the organs at risk were reviewed. The documentation of the 3D plan and dosimetry are filed in the radiation oncology EMR.   NARRATIVE: The patient was brought to the TrueBeam stereotactic radiation treatment machine and placed supine on the CT couch. The head frame was applied, and the patient was set up for stereotactic radiosurgery. Neurosurgery was present for the set-up and delivery   SIMULATION VERIFICATION: In the couch zero-angle position, the patient underwent Exactrac imaging using the Brainlab system with orthogonal KV images. These were carefully aligned and repeated to confirm treatment position for each of the isocenters. The Exactrac snap film verification was repeated at each couch angle.   SPECIAL TREATMENT PROCEDURE: The patient received stereotactic radiosurgery to the following targets:  1.  PTV11 Brainstem 48m target was treated using 4 Arcs to a prescription dose of 14 Gy. ExacTrac Snap verification was performed for each couch angle.  2.  PTV12 Rt Cerebellum 553mtarget was treated using 4 Arcs to a prescription dose of 20 Gy. ExacTrac Snap verification was performed for each couch angle. 3.  PTV13 Rt Temporal 50m37marget was treated using 3 Arcs to a prescription dose of 20 Gy. ExacTrac Snap verification was performed for each couch angle.  4.  PTV14 Lt Frontal 4mm91mrget was treated using 3 Arcs to a prescription dose of 20 Gy. ExacTrac Snap verification was performed for each couch  angle.   STEREOTACTIC TREATMENT MANAGEMENT: Following delivery, the patient was transported to nursing in stable condition and monitored for possible acute effects. Vital signs were recorded . The patient tolerated treatment without significant acute effects, and was discharged to home in stable condition. Because of difficulties with clearance of the table, the treatment to the brainstem lesion was stopped short of his complete dose due to the need to avoid a collision. We have recalculated the patient's plan and I believe that he still has received an adequate dose to this area, although slightly less than what was originally planned. Given the size and the original dose however, I believe this should be sufficient. We will carefully continue to follow this area.  PLAN: Follow-up in one month.   ------------------------------------------------  JohnJodelle Gross, PhD

## 2016-04-28 ENCOUNTER — Other Ambulatory Visit (HOSPITAL_COMMUNITY): Payer: Self-pay | Admitting: Oncology

## 2016-04-28 ENCOUNTER — Encounter (HOSPITAL_COMMUNITY): Payer: BLUE CROSS/BLUE SHIELD | Attending: Hematology & Oncology

## 2016-04-28 ENCOUNTER — Encounter (HOSPITAL_BASED_OUTPATIENT_CLINIC_OR_DEPARTMENT_OTHER): Payer: BLUE CROSS/BLUE SHIELD | Admitting: Oncology

## 2016-04-28 ENCOUNTER — Encounter (HOSPITAL_COMMUNITY): Payer: Self-pay

## 2016-04-28 VITALS — BP 124/89 | HR 78 | Temp 98.0°F | Resp 18 | Wt 200.8 lb

## 2016-04-28 DIAGNOSIS — C7951 Secondary malignant neoplasm of bone: Secondary | ICD-10-CM

## 2016-04-28 DIAGNOSIS — Z5111 Encounter for antineoplastic chemotherapy: Secondary | ICD-10-CM

## 2016-04-28 DIAGNOSIS — C3431 Malignant neoplasm of lower lobe, right bronchus or lung: Secondary | ICD-10-CM

## 2016-04-28 DIAGNOSIS — N3001 Acute cystitis with hematuria: Secondary | ICD-10-CM

## 2016-04-28 DIAGNOSIS — Z5112 Encounter for antineoplastic immunotherapy: Secondary | ICD-10-CM

## 2016-04-28 DIAGNOSIS — C3491 Malignant neoplasm of unspecified part of right bronchus or lung: Secondary | ICD-10-CM | POA: Insufficient documentation

## 2016-04-28 DIAGNOSIS — E876 Hypokalemia: Secondary | ICD-10-CM

## 2016-04-28 DIAGNOSIS — C7931 Secondary malignant neoplasm of brain: Secondary | ICD-10-CM

## 2016-04-28 DIAGNOSIS — Z5189 Encounter for other specified aftercare: Secondary | ICD-10-CM | POA: Diagnosis not present

## 2016-04-28 LAB — URINALYSIS, DIPSTICK ONLY
Glucose, UA: 100 mg/dL — AB
Hgb urine dipstick: NEGATIVE
Leukocytes, UA: NEGATIVE
NITRITE: POSITIVE — AB
PH: 6.5 (ref 5.0–8.0)
Protein, ur: >300 mg/dL — AB
Specific Gravity, Urine: 1.025 (ref 1.005–1.030)

## 2016-04-28 LAB — COMPREHENSIVE METABOLIC PANEL
ALBUMIN: 3 g/dL — AB (ref 3.5–5.0)
ALT: 15 U/L — ABNORMAL LOW (ref 17–63)
AST: 27 U/L (ref 15–41)
Alkaline Phosphatase: 76 U/L (ref 38–126)
Anion gap: 8 (ref 5–15)
BILIRUBIN TOTAL: 0.5 mg/dL (ref 0.3–1.2)
BUN: 12 mg/dL (ref 6–20)
CHLORIDE: 101 mmol/L (ref 101–111)
CO2: 27 mmol/L (ref 22–32)
Calcium: 8.3 mg/dL — ABNORMAL LOW (ref 8.9–10.3)
Creatinine, Ser: 0.63 mg/dL (ref 0.61–1.24)
GFR calc Af Amer: >60 mL/min (ref >60)
GFR calc non Af Amer: >60 mL/min (ref >60)
GLUCOSE: 141 mg/dL — AB (ref 65–99)
POTASSIUM: 3 mmol/L — AB (ref 3.5–5.1)
SODIUM: 136 mmol/L (ref 135–145)
TOTAL PROTEIN: 6.1 g/dL — AB (ref 6.5–8.1)

## 2016-04-28 LAB — CBC WITH DIFFERENTIAL/PLATELET
BASOS PCT: 0 %
Basophils Absolute: 0 10*3/uL (ref 0.0–0.1)
EOS ABS: 0 10*3/uL (ref 0.0–0.7)
EOS PCT: 0 %
HEMATOCRIT: 40.9 % (ref 39.0–52.0)
Hemoglobin: 13.6 g/dL (ref 13.0–17.0)
Lymphocytes Relative: 7 %
Lymphs Abs: 1.2 10*3/uL (ref 0.7–4.0)
MCH: 34.3 pg — ABNORMAL HIGH (ref 26.0–34.0)
MCHC: 33.3 g/dL (ref 30.0–36.0)
MCV: 103 fL — ABNORMAL HIGH (ref 78.0–100.0)
MONO ABS: 0.5 10*3/uL (ref 0.1–1.0)
MONOS PCT: 3 %
Neutro Abs: 15.2 10*3/uL — ABNORMAL HIGH (ref 1.7–7.7)
Neutrophils Relative %: 90 %
PLATELETS: 323 10*3/uL (ref 150–400)
RBC: 3.97 MIL/uL — ABNORMAL LOW (ref 4.22–5.81)
RDW: 16 % — ABNORMAL HIGH (ref 11.5–15.5)
WBC: 16.8 10*3/uL — ABNORMAL HIGH (ref 4.0–10.5)

## 2016-04-28 LAB — TSH: TSH: 0.191 u[IU]/mL — ABNORMAL LOW (ref 0.350–4.500)

## 2016-04-28 MED ORDER — PEGFILGRASTIM 6 MG/0.6ML ~~LOC~~ PSKT
6.0000 mg | PREFILLED_SYRINGE | Freq: Once | SUBCUTANEOUS | Status: AC
  Administered 2016-04-28: 6 mg via SUBCUTANEOUS
  Filled 2016-04-28: qty 0.6

## 2016-04-28 MED ORDER — COLD PACK MISC ONCOLOGY: 1.0000 | Freq: Once | Status: DC | PRN

## 2016-04-28 MED ORDER — POTASSIUM CHLORIDE CRYS ER 20 MEQ PO TBCR
60.0000 meq | EXTENDED_RELEASE_TABLET | Freq: Two times a day (BID) | ORAL | Status: DC
  Administered 2016-04-28: 60 meq via ORAL
  Filled 2016-04-28: qty 3

## 2016-04-28 MED ORDER — DIPHENHYDRAMINE HCL 50 MG/ML IJ SOLN
50.0000 mg | Freq: Once | INTRAMUSCULAR | Status: AC
  Administered 2016-04-28: 50 mg via INTRAVENOUS
  Filled 2016-04-28: qty 1

## 2016-04-28 MED ORDER — LOPERAMIDE HCL 2 MG PO CAPS: 2.0000 mg | ORAL_CAPSULE | Freq: Four times a day (QID) | ORAL | 0 refills | Status: AC | PRN

## 2016-04-28 MED ORDER — CIPROFLOXACIN HCL 250 MG PO TABS: 250.0000 mg | ORAL_TABLET | Freq: Two times a day (BID) | ORAL | 0 refills | Status: DC

## 2016-04-28 MED ORDER — SODIUM CHLORIDE 0.9 % IV SOLN
Freq: Once | INTRAVENOUS | Status: AC
  Administered 2016-04-28: 11:00:00 via INTRAVENOUS

## 2016-04-28 MED ORDER — ACETAMINOPHEN 325 MG PO TABS
650.0000 mg | ORAL_TABLET | Freq: Once | ORAL | Status: AC
  Administered 2016-04-28: 650 mg via ORAL
  Filled 2016-04-28: qty 2

## 2016-04-28 MED ORDER — SODIUM CHLORIDE 0.9% FLUSH
10.0000 mL | INTRAVENOUS | Status: DC | PRN
  Administered 2016-04-28: 10 mL
  Filled 2016-04-28: qty 10

## 2016-04-28 MED ORDER — SODIUM CHLORIDE 0.9 % IV SOLN
10.0000 mg/kg | Freq: Once | INTRAVENOUS | Status: AC
  Administered 2016-04-28: 900 mg via INTRAVENOUS
  Filled 2016-04-28: qty 40

## 2016-04-28 MED ORDER — HEPARIN SOD (PORK) LOCK FLUSH 100 UNIT/ML IV SOLN
500.0000 [IU] | Freq: Once | INTRAVENOUS | Status: AC | PRN
  Administered 2016-04-28: 500 [IU]

## 2016-04-28 MED ORDER — HEPARIN SOD (PORK) LOCK FLUSH 100 UNIT/ML IV SOLN
INTRAVENOUS | Status: AC
  Filled 2016-04-28: qty 5

## 2016-04-28 MED ORDER — DOCETAXEL CHEMO INJECTION 160 MG/16ML
75.0000 mg/m2 | Freq: Once | INTRAVENOUS | Status: AC
  Administered 2016-04-28: 160 mg via INTRAVENOUS
  Filled 2016-04-28: qty 16

## 2016-04-28 MED ORDER — DEXAMETHASONE SODIUM PHOSPHATE 10 MG/ML IJ SOLN
10.0000 mg | Freq: Once | INTRAMUSCULAR | Status: AC
  Administered 2016-04-28: 10 mg via INTRAVENOUS
  Filled 2016-04-28: qty 1

## 2016-04-28 MED ORDER — POTASSIUM CHLORIDE CRYS ER 20 MEQ PO TBCR: 20.0000 meq | EXTENDED_RELEASE_TABLET | Freq: Two times a day (BID) | ORAL | 0 refills | Status: DC

## 2016-04-28 NOTE — Progress Notes (Signed)
1100 Lab results and urine protein > 300 reviewed with Dr. Talbert Cage and orders obtained for pt to received chemo tx today and give Potassium 67mq PO today per MD      WPaschal Dopptolerated chemo tx and Neulasta on-pro well without complaints or incident. VSS upon discharge. Neulasta on-pro applied to pt's left arm with green indicator light flashing. Pt discharge via wheelchair in satisfactory condition accompanied by family member

## 2016-04-28 NOTE — Progress Notes (Signed)
Carl Burly, MD [place] [id number]  No diagnosis found.  CURRENT THERAPY: Docetaxel + Ramucirumab beginning on 11/07/2015    Adenocarcinoma of right lung ([place])   11/20/2014 Procedure    Right scalp skin biopsy      11/20/2014 Pathology Results    Adenocarcinoma of lung primary. PDL-1 NEGATIVE (0% tumor proportion score); EGFR mutation NEGATIVE; no evidence of ALK gene rearrangement detected by FISH; no reaarnagement of ROS1 gene detected.      11/29/2014 PET scan    Large hypermetabolic mass within the RLL with central necrosis. Hypermetabolic right suprahilar mass with postobstructive collapse of the RUL. Subcarinal, paratracheal, prevascular and R internal mammary nodal metastases. Small R adrenal gland nodule.      12/10/2014 - 03/25/2015 Chemotherapy    Carboplatin and pemetrexed 6 cycles      02/11/2015 PET scan    Partial metabolic response to therapy, with decrease in hypermetabolic right lung masses, postobstructive right upper lobe consolidation, thoracic lymphadenopathy and right adrenal metastasis. No new or progressive disease identified.      04/05/2015 PET scan    No significant change in hypermetabolic right lung masses and thoracic lymphadenopathy. No new or progressive disease identified.      04/15/2015 - 08/01/2015 Chemotherapy    Pemetrexed maintenance      08/14/2015 PET scan    Overall progression of metastatic lung cancer. Increasingly hypermetabolic mediastinal lymph nodes and R lung masses, enlarging R adrenal mass and new osseous lesions. Enlarging hypermetabolic R-sided scalp nodules.      08/14/2015 Progression    Progression of disease on PET imaging      08/29/2015 - 10/24/2015 Chemotherapy    Opdivo      08/29/2015 Miscellaneous    Xgeva started monthly      09/05/2015 Imaging    MRI brain- Multiple metastatic deposits in the brain. At least 9 lesions are identified. Mild edema around the 10 mm lesion in the  right cerebellum.      09/27/2015 - 09/27/2015 Radiation Therapy    Dr. Lisbeth Renshaw- SRS to brain mets:  PTV target 1-10 were treated using 2 treatment plans. A single isocenter technique was utilized to treat 8 lesion simultaneously. PTV's 2 and 5 were then treated simultaneously with a second radiation treatment plan due to their close proximity. A prescription dose of 20 Gy was delivered to each lesion. ExacTrac Snap verification was performed for each couch angle.       10/31/2015 PET scan    1. Overall there has been interval progression of disease compared with the previous exam. 2. There is an increased FDG uptake associated with the dominant mass involving the right lower lobe. Additionally, there are new hypermetabolic pre-vascular and right posterior cervical lymph nodes. 3. Significant interval increase in FDG uptake associated with right upper lobe pulmonary nodule and right adrenal gland metastases. 4. New hypermetabolic metastasis is involving the left hip. Additionally, there is intense uptake associated with fractured right rib. Underlying lesion not excluded. 5. Increase in FDG uptake associated with lesion involving the posterior nasopharynx.      10/31/2015 Progression    Progression of disease      11/07/2015 Treatment Plan Change    D/C Opdivo and change to Docetaxel + Ramucirumab      11/07/2015 -  Chemotherapy    Docetaxel + Ramucirumab       01/06/2016 PET scan    1. Overall positive response  to therapy with reduction in size and metabolic activity of metastatic lesions. 2. Decreased in the peripheral metabolic activity of the large RIGHT lower lobe mass. 3. Decrease in metabolic activity of metastatic mediastinal lymph nodes. 4. Decrease in metabolic activity of RIGHT adrenal gland metastasis. 5. Decreased metabolic activity skeletal metastasis      01/15/2016 - 01/19/2016 Hospital Admission    Admit date: 01/15/2016 Admission diagnosis: Rectal  bleeding Additional comments: Colitis      01/15/2016 Imaging    CT abd/pelvis- 1. Bowel wall thickening involving the sigmoid colon and to a lesser extent descending colon concerning for colitis. 2. 5.4 cm right lower lobe pulmonary mass most consistent with primary lung malignancy. 3.  Aortic Atherosclerosis       03/13/2016 PET scan    1. Mixed response within the chest. The dominant right lower lobe mass demonstrates continued retraction with central necrosis, although shows mildly increased peripheral hypermetabolic activity medially. 2. Most of the mediastinal lymph nodes are unchanged, although there is new subcarinal and right cervical hypermetabolic adenopathy suspicious for progressive disease. 3. Increased metabolic activity within the right adrenal gland without enlarging nodule. 4. Persistent hypermetabolic activity in the left femoral neck, mildly increased compared with previous study. No new lesions are seen. 5. Persistent sigmoid colon wall thickening suspicious for colitis.      04/14/2016 Code Status    Limited CODE STATUS.  NO INTUBATION.  All other interventions requested.      INTERVAL HISTORY: Carl Simmons 56 y.o. 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 Treated by Dr. Tressie Stalker at [facility] with Carboplatin/Pemetrexed every 21 days x 6 cycles (12/10/2014- 03/25/2015) with transition to maintenance Pemetrexed every 21 days with last dose being administered on 08/01/2015. Imaging demonstrated progression of disease, resulting in a change in therapy to [place] beginning on 08/29/2015- 10/24/2015.  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 Lisbeth Renshaw. PET scan on 10/31/2015 demonstrated progression of disease resulting in a change in therapy to Docetaxel and Ramucirumab beginning on 11/07/2015.  He is doing well today. He received  stereotactic radiosurgery to 4  brain lesions on 04/22/16. He did well with the [facility] without complications. He has some mild headaches. He has balance problems and uses a wheelchair when he is out of the house. He doesn't at home, but only walks to the bathroom and back to the couch. For 5, 6, and 7 days after chemo, he has diarrhea. He doesn't take Imodium. Denies chest pain, SOB, or any other concerns.   Review of Systems  Constitutional: Negative.   HENT: Negative.   Eyes: Negative.   Respiratory: Negative.  Negative for shortness of breath.   Cardiovascular: Negative.  Negative for chest pain.  Gastrointestinal: Positive for diarrhea.  Genitourinary: Negative.   Musculoskeletal: Positive for falls.  Skin: Negative.   Neurological: Positive for headaches.       Balance problems  Endo/Heme/Allergies: Negative.   Psychiatric/Behavioral: Negative.   All other systems reviewed and are negative.  Past Medical History:  Diagnosis Date  . Adenocarcinoma of right lung ([place]) 07/11/2015  . Bone metastasis ([place]) 08/24/2015  . Cirrhosis of liver ([place]) 08/14/2015   PET on 08/14/2015 demonstrates cirrhotic liver.   . Colitis 01/15/2016    Past Surgical History:  Procedure Laterality Date  . BIOPSY  03/09/2016   Procedure: BIOPSY;  Surgeon: Daneil Dolin, MD;  [facility];  Service: Endoscopy;;  sigmoid and rectal biopsies  . COLONOSCOPY WITH PROPOFOL N/A 03/09/2016   Procedure: COLONOSCOPY WITH PROPOFOL;  Surgeon: Daneil Dolin, MD;  Location: [facility];  Service: Endoscopy;  Laterality: N/A;  100 - pt knows to arrive at 7:45  . POLYPECTOMY  03/09/2016   Procedure: POLYPECTOMY;  Surgeon: Daneil Dolin, MD;  Location: [facility];  Service: Endoscopy;;  at splenic flexure x2; distal descending colon  . right power port placement Right    right subclavian    Family History  Problem Relation Age of Onset  . Alzheimer's disease Mother 31  . CAD Mother   . Diabetes Mother   . Cancer Father     thyroid  cancer  . CVA Father 54  . Lupus Sister   . Cancer Sister     metastatic ovarian  . Colon cancer Neg Hx     Social History   Social History  . Marital status: Married    Spouse name: N/A  . Number of children: N/A  . Years of education: N/A   Occupational History  . disabled    Social History Main Topics  . Smoking status: Current Every Day Smoker    Packs/day: 2.00  . Smokeless tobacco: Never Used  . Alcohol use No  . Drug use: No  . Sexual activity: Not Asked   Other Topics Concern  . None   Social History Narrative  . None    PHYSICAL EXAMINATION ECOG PERFORMANCE STATUS: 1 - Symptomatic but completely ambulatory Physical Exam  Constitutional: He is oriented to person, place, and time and well-developed, well-nourished, and in no distress.  HENT:  Head: Normocephalic and atraumatic.  Eyes: Conjunctivae and EOM are normal. Pupils are equal, round, and reactive to light.  Neck: Normal range of motion. Neck supple.  Cardiovascular: Normal rate, regular rhythm and normal heart sounds.   Pulmonary/Chest: Effort normal. No respiratory distress. He has no wheezes.  Abdominal: Soft. Bowel sounds are normal.  Musculoskeletal: Normal range of motion.  Neurological: He is alert and oriented to person, place, and time. Gait normal.  Skin: Skin is warm and dry.  Nursing note and vitals reviewed.  LABORATORY DATA: CBC    Component Value Date/Time   WBC 16.8 (H) 04/28/2016 0958   RBC 3.97 (L) 04/28/2016 0958   HGB 13.6 04/28/2016 0958   HCT 40.9 04/28/2016 0958   PLT 323 04/28/2016 0958   MCV 103.0 (H) 04/28/2016 0958   MCH 34.3 (H) 04/28/2016 0958   MCHC 33.3 04/28/2016 0958   RDW 16.0 (H) 04/28/2016 0958   LYMPHSABS 1.2 04/28/2016 0958   MONOABS 0.5 04/28/2016 0958   EOSABS 0.0 04/28/2016 0958   BASOSABS 0.0 04/28/2016 0958   CMP Latest Ref Rng & Units 04/07/2016 03/17/2016 02/27/2016  Glucose 65 - 99 mg/dL 171(H) 114(H) 92  BUN 6 - 20 mg/dL 11 9 17   Creatinine  0.61 - 1.24 mg/dL 0.80 0.66 0.87  Sodium 135 - 145 mmol/L 137 136 132(L)  Potassium 3.5 - 5.1 mmol/L 3.0(L) 3.4(L) 3.6  Chloride 101 - 111 mmol/L 101 100(L) 97(L)  CO2 22 - 32 mmol/L 25 27 21(L)  Calcium 8.9 - 10.3 mg/dL 8.6(L) 8.0(L) 8.4(L)  Total Protein 6.5 - 8.1 g/dL 6.3(L) 6.5 5.8(L)  Total Bilirubin 0.3 - 1.2 mg/dL 0.7 0.7 1.0  Alkaline Phos 38 - 126 U/L 77 63 62  AST 15 - 41 U/L 35 47(H) 27  ALT 17 - 63 U/L  19 34 23     Chemistry      Component Value Date/Time   NA 137 04/07/2016 0933   K 3.0 (L) 04/07/2016 0933   CL 101 04/07/2016 0933   CO2 25 04/07/2016 0933   BUN 11 04/07/2016 0933   CREATININE 0.80 04/07/2016 0933      Component Value Date/Time   CALCIUM 8.6 (L) 04/07/2016 0933   ALKPHOS 77 04/07/2016 0933   AST 35 04/07/2016 0933   ALT 19 04/07/2016 0933   BILITOT 0.7 04/07/2016 0933     RADIOGRAPHIC STUDIES: I have personally reviewed the radiological images as listed and agreed with the findings in the report.  MRI Brain 04/02/2016 IMPRESSION: 1. Three definite new small brain metastases, plus 1 additional highly suspicious dorsal cervicomedullary junction area of enhancement. No associated edema or mass effect. These are annotated with double arrows on series 10. 2. All previously treated brain metastases are diminutive or resolved; three remain faintly visible following contrast. 3. Resected right posterior scalp mass and resolved or resected left nasopharyngeal lesion since August.  NUCLEAR MEDICINE PET SKULL BASE TO THIGH 03/13/2016 IMPRESSION: 1. Mixed response within the chest. The dominant right lower lobe mass demonstrates continued retraction with central necrosis, although shows mildly increased peripheral hypermetabolic activity medially. 2. Most of the mediastinal lymph nodes are unchanged, although there is new subcarinal and right cervical hypermetabolic adenopathy suspicious for progressive disease. 3. Increased metabolic activity within  the right adrenal gland without enlarging nodule. 4. Persistent hypermetabolic activity in the left femoral neck, mildly increased compared with previous study. No new lesions are seen. 5. Persistent sigmoid colon wall thickening suspicious for colitis.  PATHOLOGY:    ASSESSMENT AND PLAN:  PET scan reviewed. Results noted above. He's had a mixed response with decrease in the primary tumor in the RLL, mediastinal lymphadenopathy unchanged, hypermetabolic right femoral head, hypermetabolic left adrenal without definite mass,  Continue current treatment, patient does not want to stop getting treatment. If he progresses on his next restaging scans in 3 months, I would recommend for patient to consider hospice. Patient was seen by Dr. Gala Romney and was found to have a large pedunculated polyp in the distal descending colon, engorged hemorrhoids, and localized proctocolitis limited to the distal colon/rectum. Biopsies confirmed active colitis. Dr. Coralee North questions whether the patient may have quiescent baseline IBD. He recommend to start patient on cortenemas (168m hydrocortisone rentention enemas one daily at bedtime and retained for 30 min), 1 week prior to next chemotherapy and continued for a month afterwards. I will send in a Rx for the enemas today for the patient. Counseled on smoke cessation, however patient states that he wants to keep smoking. .   THERAPY PLAN:  Continue treatment as outlined above: Docetaxel + Ramucirumab  Proceed with scheduled PET scan on 05/06/2016.  Labs reviewed. Proceed with chemo today.   I have discussed with patient regarding taking imodium every 4-6 hours ATC on days 5-7 post chemo when he usually gets diarrhea. Rx for imodium sent to pharmacy.  He will return for follow up on 05/07/16 to review PET results.  All questions were answered. The patient knows to call the clinic with any problems, questions or concerns. We can certainly see the patient much sooner if  necessary.  This document serves as a record of services personally performed by LTwana First MD. It was created on her behalf by JMartiniqueCasey, a trained medical scribe. The creation of this record is based on the scribe's  personal observations and the provider's statements to them. This document has been checked and approved by the attending provider.  I have reviewed the above documentation for accuracy and completeness and I agree with the above.  This note is electronically signed by: Martinique M Casey 04/28/2016 10:26 AM

## 2016-04-28 NOTE — Patient Instructions (Signed)
[place] at [facility] Discharge Instructions  RECOMMENDATIONS MADE BY THE CONSULTANT AND ANY TEST RESULTS WILL BE SENT TO YOUR REFERRING PHYSICIAN.  You were seen today by Dr. Twana First Follow up a few days after PET scan to review results See Amy up front for appointments   Thank you for choosing [place] at [facility] to provide your oncology and hematology care.  To afford each patient quality time with our provider, please arrive at least 15 minutes before your scheduled appointment time.    If you have a lab appointment with the [place] please come in thru the  Main Entrance and check in at the main information desk  You need to re-schedule your appointment should you arrive 10 or more minutes late.  We strive to give you quality time with our providers, and arriving late affects you and other patients whose appointments are after yours.  Also, if you no show three or more times for appointments you may be dismissed from the clinic at the providers discretion.     Again, thank you for choosing [facility].  Our hope is that these requests will decrease the amount of time that you wait before being seen by our physicians.       _____________________________________________________________  Should you have questions after your visit to [facility], please contact our office at [phone] between the hours of 8:30 a.m. and 4:30 p.m.  Voicemails left after 4:30 p.m. will not be returned until the following business day.  For prescription refill requests, have your pharmacy contact our office.       Resources For Cancer Patients and their Caregivers ? American Cancer Society: Can assist with transportation, wigs, general needs, runs Look Good Feel Better.        878-845-5754 ? Cancer Care: Provides financial assistance, online support groups, medication/co-pay assistance.  1-800-813-HOPE  8456460114) ? [facility] Assists [place] cancer patients and their families through emotional , educational and financial support.  323-168-2280 ? [place] DSS Where to apply for food stamps, Medicaid and utility assistance. 249-430-1080 ? [facility]: Transportation to medical appointments. (785)190-7873 ? Social Security Administration: May apply for disability if have a Stage IV cancer. (669)778-7182 970-611-0952 ? [employer], Disability and Transit Services: Assists with nutrition, care and transit needs. [place] Support Programs: '@10RELATIVEDAYS'$ @ > Cancer Support Group  2nd Tuesday of the month 1pm-2pm, Journey Room  > Creative Journey  3rd Tuesday of the month 1130am-1pm, Journey Room  > Look Good Feel Better  1st Wednesday of the month 10am-12 noon, Journey Room (Call [facility] to register [phone])

## 2016-04-28 NOTE — Patient Instructions (Signed)
[facility] Discharge Instructions for Patients Receiving Chemotherapy   Beginning January 23rd 2017 lab work for the [facility] will be done in the  Main lab at [facility] on 1st floor. If you have a lab appointment with the [facility] please come in thru the  Main Entrance and check in at the main information desk   Today you received the following chemotherapy agents Taxotere and Cyramza as well as Neulasta on-pro. Follow-up as scheduled. Call clinic for any questions or concerns  To help prevent nausea and vomiting after your treatment, we encourage you to take your nausea medication   If you develop nausea and vomiting, or diarrhea that is not controlled by your medication, call the clinic.  The clinic phone number is [phone]. Office hours are Monday-Friday 8:30am-5:00pm.  BELOW ARE SYMPTOMS THAT SHOULD BE REPORTED IMMEDIATELY:  *FEVER GREATER THAN 101.0 F  *CHILLS WITH OR WITHOUT FEVER  NAUSEA AND VOMITING THAT IS NOT CONTROLLED WITH YOUR NAUSEA MEDICATION  *UNUSUAL SHORTNESS OF BREATH  *UNUSUAL BRUISING OR BLEEDING  TENDERNESS IN MOUTH AND THROAT WITH OR WITHOUT PRESENCE OF ULCERS  *URINARY PROBLEMS  *BOWEL PROBLEMS  UNUSUAL RASH Items with * indicate a potential emergency and should be followed up as soon as possible. If you have an emergency after office hours please contact your primary care physician or go to the nearest emergency department.  Please call the clinic during office hours if you have any questions or concerns.   You may also contact the Patient Navigator at [phone] should you have any questions or need assistance in obtaining follow up care.      Resources For Cancer Patients and their Caregivers ? [employer]: Can assist with transportation, wigs, general needs, runs Look Good Feel Better.        (501) 503-0792 ? Cancer Care: Provides financial assistance, online support groups,  medication/co-pay assistance.  1-800-813-HOPE 816-584-1722) ? [place] Assists [place] cancer patients and their families through emotional , educational and financial support.  352-068-8743 ? [place] DSS Where to apply for food stamps, Medicaid and utility assistance. 205-470-5216 ? [facility]: Transportation to medical appointments. 3136458251 ? Social Security Administration: May apply for disability if have a Stage IV cancer. 503-399-7057 (863)450-6674 ? [employer], Disability and Transit Services: Assists with nutrition, care and transit needs. 6676893342

## 2016-04-29 ENCOUNTER — Encounter: Payer: Self-pay | Admitting: Internal Medicine

## 2016-04-29 ENCOUNTER — Encounter (HOSPITAL_COMMUNITY): Payer: Self-pay | Admitting: Oncology

## 2016-05-04 ENCOUNTER — Other Ambulatory Visit (HOSPITAL_COMMUNITY): Payer: Self-pay | Admitting: Oncology

## 2016-05-04 DIAGNOSIS — C7951 Secondary malignant neoplasm of bone: Secondary | ICD-10-CM

## 2016-05-04 DIAGNOSIS — C7931 Secondary malignant neoplasm of brain: Secondary | ICD-10-CM

## 2016-05-04 DIAGNOSIS — C3491 Malignant neoplasm of unspecified part of right bronchus or lung: Secondary | ICD-10-CM

## 2016-05-06 ENCOUNTER — Ambulatory Visit (HOSPITAL_COMMUNITY): Payer: BLUE CROSS/BLUE SHIELD

## 2016-05-08 ENCOUNTER — Ambulatory Visit (HOSPITAL_COMMUNITY): Payer: BLUE CROSS/BLUE SHIELD

## 2016-05-11 ENCOUNTER — Other Ambulatory Visit (HOSPITAL_COMMUNITY): Payer: Self-pay | Admitting: Oncology

## 2016-05-11 DIAGNOSIS — N3001 Acute cystitis with hematuria: Secondary | ICD-10-CM

## 2016-05-14 ENCOUNTER — Ambulatory Visit (HOSPITAL_COMMUNITY)
Admission: RE | Admit: 2016-05-14 | Discharge: 2016-05-14 | Disposition: A | Discharge status: Home / Self Care | Payer: BLUE CROSS/BLUE SHIELD | Source: Ambulatory Visit | Attending: Oncology | Admitting: Oncology

## 2016-05-14 ENCOUNTER — Encounter (HOSPITAL_COMMUNITY): Payer: Self-pay

## 2016-05-14 DIAGNOSIS — C771 Secondary and unspecified malignant neoplasm of intrathoracic lymph nodes: Secondary | ICD-10-CM | POA: Diagnosis not present

## 2016-05-14 DIAGNOSIS — R188 Other ascites: Secondary | ICD-10-CM | POA: Insufficient documentation

## 2016-05-14 DIAGNOSIS — C7951 Secondary malignant neoplasm of bone: Secondary | ICD-10-CM | POA: Insufficient documentation

## 2016-05-14 DIAGNOSIS — C3431 Malignant neoplasm of lower lobe, right bronchus or lung: Secondary | ICD-10-CM | POA: Diagnosis not present

## 2016-05-14 DIAGNOSIS — C3491 Malignant neoplasm of unspecified part of right bronchus or lung: Secondary | ICD-10-CM | POA: Diagnosis present

## 2016-05-14 DIAGNOSIS — R918 Other nonspecific abnormal finding of lung field: Secondary | ICD-10-CM | POA: Diagnosis not present

## 2016-05-14 DIAGNOSIS — C7931 Secondary malignant neoplasm of brain: Secondary | ICD-10-CM | POA: Diagnosis not present

## 2016-05-14 MED ORDER — IOPAMIDOL (ISOVUE-300) INJECTION 61%
100.0000 mL | Freq: Once | INTRAVENOUS | Status: AC | PRN
Start: 2016-05-14 — End: 2016-05-14
  Administered 2016-05-14: 100 mL via INTRAVENOUS

## 2016-05-18 ENCOUNTER — Ambulatory Visit: Payer: BLUE CROSS/BLUE SHIELD | Admitting: Nurse Practitioner

## 2016-05-19 ENCOUNTER — Other Ambulatory Visit (HOSPITAL_COMMUNITY): Payer: Self-pay | Admitting: Oncology

## 2016-05-19 ENCOUNTER — Encounter (HOSPITAL_BASED_OUTPATIENT_CLINIC_OR_DEPARTMENT_OTHER): Payer: BLUE CROSS/BLUE SHIELD

## 2016-05-19 ENCOUNTER — Ambulatory Visit (HOSPITAL_COMMUNITY)
Admission: RE | Admit: 2016-05-19 | Discharge: 2016-05-19 | Disposition: A | Discharge status: Home / Self Care | Payer: BLUE CROSS/BLUE SHIELD | Source: Ambulatory Visit | Attending: Oncology | Admitting: Oncology

## 2016-05-19 ENCOUNTER — Encounter (HOSPITAL_COMMUNITY): Payer: Self-pay | Admitting: Oncology

## 2016-05-19 ENCOUNTER — Encounter: Payer: Self-pay | Admitting: *Deleted

## 2016-05-19 ENCOUNTER — Encounter (HOSPITAL_BASED_OUTPATIENT_CLINIC_OR_DEPARTMENT_OTHER): Payer: BLUE CROSS/BLUE SHIELD | Admitting: Oncology

## 2016-05-19 ENCOUNTER — Ambulatory Visit (HOSPITAL_COMMUNITY): Payer: BLUE CROSS/BLUE SHIELD

## 2016-05-19 VITALS — BP 107/89 | HR 85 | Temp 98.1°F | Resp 18 | Wt 196.6 lb

## 2016-05-19 DIAGNOSIS — L089 Local infection of the skin and subcutaneous tissue, unspecified: Secondary | ICD-10-CM

## 2016-05-19 DIAGNOSIS — K529 Noninfective gastroenteritis and colitis, unspecified: Secondary | ICD-10-CM

## 2016-05-19 DIAGNOSIS — Z5111 Encounter for antineoplastic chemotherapy: Secondary | ICD-10-CM

## 2016-05-19 DIAGNOSIS — E876 Hypokalemia: Secondary | ICD-10-CM | POA: Diagnosis not present

## 2016-05-19 DIAGNOSIS — C7951 Secondary malignant neoplasm of bone: Secondary | ICD-10-CM

## 2016-05-19 DIAGNOSIS — C7931 Secondary malignant neoplasm of brain: Secondary | ICD-10-CM

## 2016-05-19 DIAGNOSIS — Z7189 Other specified counseling: Secondary | ICD-10-CM

## 2016-05-19 DIAGNOSIS — K7469 Other cirrhosis of liver: Secondary | ICD-10-CM

## 2016-05-19 DIAGNOSIS — C3491 Malignant neoplasm of unspecified part of right bronchus or lung: Secondary | ICD-10-CM | POA: Diagnosis not present

## 2016-05-19 DIAGNOSIS — Z95828 Presence of other vascular implants and grafts: Secondary | ICD-10-CM

## 2016-05-19 DIAGNOSIS — M7989 Other specified soft tissue disorders: Secondary | ICD-10-CM | POA: Diagnosis not present

## 2016-05-19 DIAGNOSIS — R8290 Unspecified abnormal findings in urine: Secondary | ICD-10-CM

## 2016-05-19 LAB — URINALYSIS, DIPSTICK ONLY
GLUCOSE, UA: 100 mg/dL — AB
HGB URINE DIPSTICK: NEGATIVE
Leukocytes, UA: NEGATIVE
Nitrite: POSITIVE — AB
PH: 5.5 (ref 5.0–8.0)

## 2016-05-19 LAB — URINALYSIS, ROUTINE W REFLEX MICROSCOPIC
Glucose, UA: 100 mg/dL — AB
Hgb urine dipstick: NEGATIVE
Ketones, ur: NEGATIVE mg/dL
Leukocytes, UA: NEGATIVE
Nitrite: POSITIVE — AB
PH: 5.5 (ref 5.0–8.0)
Specific Gravity, Urine: >1.03 — ABNORMAL HIGH (ref 1.005–1.030)

## 2016-05-19 LAB — COMPREHENSIVE METABOLIC PANEL
ALBUMIN: 2.7 g/dL — AB (ref 3.5–5.0)
ALK PHOS: 100 U/L (ref 38–126)
ALT: 12 U/L — AB (ref 17–63)
AST: 18 U/L (ref 15–41)
Anion gap: 10 (ref 5–15)
BILIRUBIN TOTAL: 1.1 mg/dL (ref 0.3–1.2)
BUN: 7 mg/dL (ref 6–20)
CALCIUM: 6.2 mg/dL — AB (ref 8.9–10.3)
CO2: 24 mmol/L (ref 22–32)
CREATININE: 0.56 mg/dL — AB (ref 0.61–1.24)
Chloride: 98 mmol/L — ABNORMAL LOW (ref 101–111)
GFR calc Af Amer: >60 mL/min (ref >60)
GFR calc non Af Amer: >60 mL/min (ref >60)
GLUCOSE: 131 mg/dL — AB (ref 65–99)
Potassium: 3.1 mmol/L — ABNORMAL LOW (ref 3.5–5.1)
SODIUM: 132 mmol/L — AB (ref 135–145)
Total Protein: 5.9 g/dL — ABNORMAL LOW (ref 6.5–8.1)

## 2016-05-19 LAB — CBC WITH DIFFERENTIAL/PLATELET
BASOS PCT: 0 %
Basophils Absolute: 0 10*3/uL (ref 0.0–0.1)
EOS ABS: 0 10*3/uL (ref 0.0–0.7)
Eosinophils Relative: 0 %
HEMATOCRIT: 37.6 % — AB (ref 39.0–52.0)
HEMOGLOBIN: 12.7 g/dL — AB (ref 13.0–17.0)
LYMPHS ABS: 1 10*3/uL (ref 0.7–4.0)
Lymphocytes Relative: 8 %
MCH: 34 pg (ref 26.0–34.0)
MCHC: 33.8 g/dL (ref 30.0–36.0)
MCV: 100.8 fL — ABNORMAL HIGH (ref 78.0–100.0)
Monocytes Absolute: 0.8 10*3/uL (ref 0.1–1.0)
Monocytes Relative: 7 %
NEUTROS ABS: 10.2 10*3/uL — AB (ref 1.7–7.7)
NEUTROS PCT: 85 %
Platelets: 209 10*3/uL (ref 150–400)
RBC: 3.73 MIL/uL — AB (ref 4.22–5.81)
RDW: 16.2 % — ABNORMAL HIGH (ref 11.5–15.5)
WBC: 12 10*3/uL — AB (ref 4.0–10.5)

## 2016-05-19 LAB — URINALYSIS, MICROSCOPIC (REFLEX): WBC, UA: NONE SEEN WBC/hpf (ref 0–5)

## 2016-05-19 LAB — TSH: TSH: 0.503 u[IU]/mL (ref 0.350–4.500)

## 2016-05-19 MED ORDER — CALCIUM GLUCONATE 10 % IV SOLN
2.0000 g | Freq: Once | INTRAVENOUS | Status: AC
  Administered 2016-05-19: 2 g via INTRAVENOUS
  Filled 2016-05-19: qty 20

## 2016-05-19 MED ORDER — POTASSIUM CHLORIDE CRYS ER 20 MEQ PO TBCR
60.0000 meq | EXTENDED_RELEASE_TABLET | Freq: Once | ORAL | Status: AC
  Administered 2016-05-19: 60 meq via ORAL
  Filled 2016-05-19: qty 3

## 2016-05-19 MED ORDER — SODIUM CHLORIDE 0.9 % IV SOLN
INTRAVENOUS | Status: AC
  Administered 2016-05-19: 12:00:00 via INTRAVENOUS
  Filled 2016-05-19: qty 1000

## 2016-05-19 MED ORDER — HEPARIN SOD (PORK) LOCK FLUSH 100 UNIT/ML IV SOLN
500.0000 [IU] | Freq: Once | INTRAVENOUS | Status: AC
  Administered 2016-05-19: 500 [IU] via INTRAVENOUS

## 2016-05-19 MED ORDER — CEPHALEXIN 500 MG PO CAPS: 500.0000 mg | ORAL_CAPSULE | Freq: Four times a day (QID) | ORAL | 0 refills | Status: DC

## 2016-05-19 MED ORDER — HEPARIN SOD (PORK) LOCK FLUSH 100 UNIT/ML IV SOLN
INTRAVENOUS | Status: AC
  Filled 2016-05-19: qty 5

## 2016-05-19 NOTE — Progress Notes (Signed)
[name], MD [place] [address]  Adenocarcinoma of right lung [facility])  Bone metastasis ([place])  Brain metastases ([place])  Colitis  Hypocalcemia - Plan: Vitamin D 25 hydroxy, PTH, intact and calcium  Chemotherapy management, encounter for  Goals of care, counseling/discussion  Skin infection - Plan: WOUND CULTURE, DG Hand Complete Right, cephALEXin (KEFLEX) 500 MG capsule  Abnormal urinalysis - Plan: cephALEXin (KEFLEX) 500 MG capsule  CURRENT THERAPY: Docetaxel + Ramucirumab beginning on 11/07/2015  INTERVAL HISTORY: Carl Simmons 56 y.o. 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 Treated by Dr. Tressie Stalker at [facility] with Carboplatin/Pemetrexed every 21 days x 6 cycles (12/10/2014- 03/25/2015) with transition to maintenance Pemetrexed every 21 days with last dose being administered on 08/01/2015. Imaging demonstrated progression of disease, resulting in a change in therapy to [place] beginning on 08/29/2015- 10/24/2015.  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 Lisbeth Renshaw. PET scan on 10/31/2015 demonstrated progression of disease resulting in a change in therapy to Docetaxel and Ramucirumab beginning on 11/07/2015.    Adenocarcinoma of right lung ([place])   11/20/2014 Procedure    Right scalp skin biopsy      11/20/2014 Pathology Results    Adenocarcinoma of lung primary. PDL-1 NEGATIVE (0% tumor proportion score); EGFR mutation NEGATIVE; no evidence of ALK gene rearrangement detected by FISH; no reaarnagement of ROS1 gene detected.      11/29/2014 PET scan    Large hypermetabolic mass within the RLL with central necrosis. Hypermetabolic right suprahilar mass with postobstructive collapse of the RUL. Subcarinal, paratracheal, prevascular and R internal mammary nodal metastases. Small R adrenal gland nodule.      12/10/2014 - 03/25/2015 Chemotherapy      Carboplatin and pemetrexed 6 cycles      02/11/2015 PET scan    Partial metabolic response to therapy, with decrease in hypermetabolic right lung masses, postobstructive right upper lobe consolidation, thoracic lymphadenopathy and right adrenal metastasis. No new or progressive disease identified.      04/05/2015 PET scan    No significant change in hypermetabolic right lung masses and thoracic lymphadenopathy. No new or progressive disease identified.      04/15/2015 - 08/01/2015 Chemotherapy    Pemetrexed maintenance      08/14/2015 PET scan    Overall progression of metastatic lung cancer. Increasingly hypermetabolic mediastinal lymph nodes and R lung masses, enlarging R adrenal mass and new osseous lesions. Enlarging hypermetabolic R-sided scalp nodules.      08/14/2015 Progression    Progression of disease on PET imaging      08/29/2015 - 10/24/2015 Chemotherapy    Opdivo      08/29/2015 Miscellaneous    Xgeva started monthly      09/05/2015 Imaging    MRI brain- Multiple metastatic deposits in the brain. At least 9 lesions are identified. Mild edema around the 10 mm lesion in the right cerebellum.      09/27/2015 - 09/27/2015 Radiation Therapy    Dr. Lisbeth Renshaw- SRS to brain mets:  PTV target 1-10 were treated using 2 treatment plans. A single isocenter technique was utilized to treat 8 lesion simultaneously. PTV's 2 and 5 were then treated simultaneously with a second radiation treatment plan due to their close proximity. A prescription dose of 20 Gy was delivered to each lesion. ExacTrac Snap verification was performed for each couch angle.  10/31/2015 PET scan    1. Overall there has been interval progression of disease compared with the previous exam. 2. There is an increased FDG uptake associated with the dominant mass involving the right lower lobe. Additionally, there are new hypermetabolic pre-vascular and right posterior cervical lymph nodes. 3. Significant interval  increase in FDG uptake associated with right upper lobe pulmonary nodule and right adrenal gland metastases. 4. New hypermetabolic metastasis is involving the left hip. Additionally, there is intense uptake associated with fractured right rib. Underlying lesion not excluded. 5. Increase in FDG uptake associated with lesion involving the posterior nasopharynx.      10/31/2015 Progression    Progression of disease      11/07/2015 Treatment Plan Change    D/C Opdivo and change to Docetaxel + Ramucirumab      11/07/2015 -  Chemotherapy    Docetaxel + Ramucirumab       01/06/2016 PET scan    1. Overall positive response to therapy with reduction in size and metabolic activity of metastatic lesions. 2. Decreased in the peripheral metabolic activity of the large RIGHT lower lobe mass. 3. Decrease in metabolic activity of metastatic mediastinal lymph nodes. 4. Decrease in metabolic activity of RIGHT adrenal gland metastasis. 5. Decreased metabolic activity skeletal metastasis      01/15/2016 - 01/19/2016 Hospital Admission    Admit date: 01/15/2016 Admission diagnosis: Rectal bleeding Additional comments: Colitis      01/15/2016 Imaging    CT abd/pelvis- 1. Bowel wall thickening involving the sigmoid colon and to a lesser extent descending colon concerning for colitis. 2. 5.4 cm right lower lobe pulmonary mass most consistent with primary lung malignancy. 3.  Aortic Atherosclerosis       03/13/2016 PET scan    1. Mixed response within the chest. The dominant right lower lobe mass demonstrates continued retraction with central necrosis, although shows mildly increased peripheral hypermetabolic activity medially. 2. Most of the mediastinal lymph nodes are unchanged, although there is new subcarinal and right cervical hypermetabolic adenopathy suspicious for progressive disease. 3. Increased metabolic activity within the right adrenal gland without enlarging nodule. 4.  Persistent hypermetabolic activity in the left femoral neck, mildly increased compared with previous study. No new lesions are seen. 5. Persistent sigmoid colon wall thickening suspicious for colitis.      04/14/2016 Code Status    Limited CODE STATUS.  NO INTUBATION.  All other interventions requested.      04/22/2016 - 04/22/2016 Radiation Therapy    Dr. Lisbeth Renshaw in [place]-  Site/dose:  1) PTV11 Brainstem 1m: 14 Gy in 1 fraction 2) PTV12 Right Cerebellum 520m 20 Gy in 1 fraction 3) PTV13 Right Temporal 24m5m20 Gy in 1 fraction 4) PTV14 Left Frontal 4mm45m0 Gy in 1 fraction  Beams/energy:    1) SBRT/SRT-3D // 6FFF Photon 2) SBRT/SRT-3D // 6FFF Photon 3) SBRT/SRT-3D // 6FFF Photon 4) SBRT/SRT-3D // 6FFF Photon      05/14/2016 Imaging    Ct head- Negative CT. Tiny metastatic lesions demonstrated by MRI on 04/02/2016 are not visible utilizing CT. No statement can be made about whether these have been successfully treated or are simply not visible by this less sensitive technique.      05/15/2016 Imaging    CT CAP- Dominant 5.4 cm right lower lobe mass, corresponding to known pulmonary adenocarcinoma, grossly unchanged. Additional satellite nodularity/ tumor in the right upper and lower lobes.  Thoracic nodal metastases, as above, grossly unchanged.  Suspected 1.7 cm right adrenal  metastasis, slightly more conspicuous than on the prior.  Suspected sigmoid colitis. Small volume pelvic ascites. No free air.      05/19/2016 Treatment Plan Change    Treatment deferred due to infection of finger.       He reports "bumping" his right middle finger against the stove/oven a few days ago.  Since then, he has noted a progressive inflammation and pain of his #3 finger.  Recently, he is noted some discharge from the finger.  He denies any fevers or chills.  Range of motion is normal but with flexion of #3 finger pain is increased.  Neurologically intact with full sensation.  He is  interested in learning the results of his most recent restaging scans.  He otherwise denies any oncology related complaints.  Appetite and weight are relatively stable.  Other than his finger, he denies any new pain.  He denies any hemoptysis or cough.  Review of Systems  Constitutional: Negative.  Negative for chills, fever and weight loss.  HENT: Negative.   Eyes: Negative.   Respiratory: Negative.  Negative for cough, hemoptysis and sputum production.   Cardiovascular: Negative.  Negative for chest pain.  Gastrointestinal: Negative.  Negative for blood in stool, constipation, diarrhea, melena, nausea and vomiting.  Genitourinary: Negative.   Musculoskeletal: Positive for joint pain (right #3 finger).  Skin: Negative.   Neurological: Negative.  Negative for dizziness, seizures, loss of consciousness, weakness and headaches.  Endo/Heme/Allergies: Negative.   Psychiatric/Behavioral: Negative.     Past Medical History:  Diagnosis Date  . Adenocarcinoma of right lung ([place]) 07/11/2015  . Bone metastasis ([place]) 08/24/2015  . Brain metastases ([place]) 09/18/2015  . Cirrhosis of liver ([place]) 08/14/2015   PET on 08/14/2015 demonstrates cirrhotic liver.   . Colitis 01/15/2016    Past Surgical History:  Procedure Laterality Date  . BIOPSY  03/09/2016   Procedure: BIOPSY;  Surgeon: Daneil Dolin, MD;  [facility];  Service: Endoscopy;;  sigmoid and rectal biopsies  . COLONOSCOPY WITH PROPOFOL N/A 03/09/2016   Procedure: COLONOSCOPY WITH PROPOFOL;  Surgeon: Daneil Dolin, MD;  Location: [facility];  Service: Endoscopy;  Laterality: N/A;  100 - pt knows to arrive at 7:45  . POLYPECTOMY  03/09/2016   Procedure: POLYPECTOMY;  Surgeon: Daneil Dolin, MD;  Location: [facility];  Service: Endoscopy;;  at splenic flexure x2; distal descending colon  . right power port placement Right    right subclavian    Family History  Problem Relation Age of Onset  . Alzheimer's disease Mother  59  . CAD Mother   . Diabetes Mother   . Cancer Father     thyroid cancer  . CVA Father 67  . Lupus Sister   . Cancer Sister     metastatic ovarian  . Colon cancer Neg Hx     Social History   Social History  . Marital status: Married    Spouse name: N/A  . Number of children: N/A  . Years of education: N/A   Occupational History  . disabled    Social History Main Topics  . Smoking status: Current Every Day Smoker    Packs/day: 2.00  . Smokeless tobacco: Never Used  . Alcohol use No  . Drug use: No  . Sexual activity: Not Asked   Other Topics Concern  . None   Social History Narrative  . None     PHYSICAL EXAMINATION  ECOG PERFORMANCE STATUS: 1 - Symptomatic but  completely ambulatory  There were no vitals filed for this visit.  Vitals - 1 value per visit 07/27/7791  SYSTOLIC 903  DIASTOLIC 89  Pulse 85  Temperature 98.1  Respirations 18  Weight (lb) 196.6    GENERAL:alert, no distress, well nourished, well developed, comfortable, cooperative, smiling, tearful at times and accompanied by wife.   SKIN: skin color, texture, turgor are normal, no rashes or significant lesions, dry skin. HEAD: Normocephalic, No masses, lesions, tenderness or abnormalities.  EYES: normal, EOMI, Conjunctiva are pink and non-injected EARS: External ears normal OROPHARYNX:lips, buccal mucosa, and tongue normal and mucous membranes are moist  NECK: supple, trachea midline LYMPH:  no palpable lymphadenopathy BREAST:not examined LUNGS: clear to auscultation with decreased breath sounds bilaterally. HEART: regular rate & rhythm ABDOMEN:abdomen soft, obese and normal bowel sounds BACK: Back symmetric, no curvature. EXTREMITIES: #3 right finger with erythema and edema at the MIP and distally with bloody white discharge at the distal aspect of finger, just proximal to nail.  Skin is raised from the deeper tissue similar to a bullous appearance.   NEURO: alert & oriented x 3 with fluent  speech, no focal motor/sensory deficits, in wheelchair.   LABORATORY DATA: CBC    Component Value Date/Time   WBC 12.0 (H) 05/19/2016 0932   RBC 3.73 (L) 05/19/2016 0932   HGB 12.7 (L) 05/19/2016 0932   HCT 37.6 (L) 05/19/2016 0932   PLT 209 05/19/2016 0932   MCV 100.8 (H) 05/19/2016 0932   MCH 34.0 05/19/2016 0932   MCHC 33.8 05/19/2016 0932   RDW 16.2 (H) 05/19/2016 0932   LYMPHSABS 1.0 05/19/2016 0932   MONOABS 0.8 05/19/2016 0932   EOSABS 0.0 05/19/2016 0932   BASOSABS 0.0 05/19/2016 0932      Chemistry      Component Value Date/Time   NA 132 (L) 05/19/2016 0932   K 3.1 (L) 05/19/2016 0932   CL 98 (L) 05/19/2016 0932   CO2 24 05/19/2016 0932   BUN 7 05/19/2016 0932   CREATININE 0.56 (L) 05/19/2016 0932      Component Value Date/Time   CALCIUM 6.2 (LL) 05/19/2016 0932   ALKPHOS 100 05/19/2016 0932   AST 18 05/19/2016 0932   ALT 12 (L) 05/19/2016 0932   BILITOT 1.1 05/19/2016 0932        PENDING LABS:   RADIOGRAPHIC STUDIES:  Ct Head W Wo Contrast  Result Date: 05/14/2016 CLINICAL DATA:  Stage IV adenocarcinoma. Frequent falling. Headache. Previous radiation to brain metastases. EXAM: CT HEAD WITHOUT AND WITH CONTRAST TECHNIQUE: Contiguous axial images were obtained from the base of the skull through the vertex without and with intravenous contrast CONTRAST:  180m ISOVUE-300 IOPAMIDOL (ISOVUE-300) INJECTION 61% COMPARISON:  MRI 04/02/2016 FINDINGS: Brain: The brain has normal appearance by CT. No evidence of atrophy, old or acute infarction, mass lesion, hemorrhage, hydrocephalus or extra-axial collection. No abnormal contrast enhancement occurs. Vascular: No abnormal vascular finding. Skull: Negative Sinuses/Orbits: Clear/normal Other: Previous scalp per section on the right. IMPRESSION: Negative CT. Tiny metastatic lesions demonstrated by MRI on 04/02/2016 are not visible utilizing CT. No statement can be made about whether these have been successfully treated  or are simply not visible by this less sensitive technique. Electronically Signed   By: MNelson ChimesM.D.   On: 05/14/2016 18:37   Ct Chest W Contrast  Result Date: 05/15/2016 CLINICAL DATA:  Stage IV adenocarcinoma, status post stereotactic radiation to brain in March 2018, diarrhea after chemotherapy, frequent falls, headache EXAM: CT CHEST,  ABDOMEN, AND PELVIS WITH CONTRAST TECHNIQUE: Multidetector CT imaging of the chest, abdomen and pelvis was performed following the standard protocol during bolus administration of intravenous contrast. CONTRAST:  120m ISOVUE-300 IOPAMIDOL (ISOVUE-300) INJECTION 61% COMPARISON:  PET-CT dated 03/13/2016 FINDINGS: CT CHEST FINDINGS Cardiovascular: Heart is normal in size.  Trace pericardial fluid. No evidence of thoracic aortic aneurysm. Mild atherosclerotic calcifications the aortic arch. Right chest port terminates in the upper SVC. Associated thrombus adjacent to the catheter tip (series 2/ image 25). Mediastinum/Nodes: Thoracic lymphadenopathy, similar to recent PET, including: --9 mm short axis high right paratracheal node (series 2/ image 25), previously 8 mm --11 mm short axis right paratracheal node (series 2/ image 29) previously 9 mm --2.4 cm short axis right hilar node (series 2/ image 35), previously 2.3 cm --8 mm short axis subcarinal node (series 2/ image 37), unchanged Visualized thyroid is unremarkable. Lungs/Pleura: 5.4 x 5.0 cm right lower lobe mass abutting the fissure (series 4/ image 107), corresponding to known lung adenocarcinoma, grossly unchanged. Adjacent satellite nodularity (series 4/images 92 and 121). Additional postobstructive opacity/ tumor in the right upper lobe, measuring at least 4.0 x 2.1 cm (series 4/ image 95), grossly unchanged. Associated satellite nodularity abutting the fissure (series 4/image 87). Underlying mild emphysematous changes. Left lung is essentially clear. No pleural effusion or pneumothorax. Musculoskeletal: No focal  osseous lesions. CT ABDOMEN PELVIS FINDINGS Hepatobiliary: Liver is within normal limits. Gallbladder is unremarkable. No intrahepatic or extrahepatic ductal dilatation. Pancreas: Within normal limits. Spleen: Within normal limits. Adrenals/Urinary Tract: Mild thickening of the bilateral adrenal glands with a suspected 13 x 17 mm discrete right adrenal nodule (series 2/ image 68), worrisome for small metastasis. Kidneys are within normal limits.  No hydronephrosis. Bladder is mildly thick-walled although underdistended. Stomach/Bowel: Stomach is within normal limits. No evidence of bowel obstruction. Normal appendix (series 2/ image 101). Sigmoid colon is underdistended with diverticulosis and associated mucosal hypertrophy. Superimposed sigmoid colitis is suspected given the clinical history of diarrhea. Vascular/Lymphatic: No evidence of abdominal aortic aneurysm. Atherosclerotic calcifications of the abdominal aorta and branch vessels. No suspicious abdominopelvic lymphadenopathy. Reproductive: Prostate is grossly unremarkable. Other: Small volume pelvic ascites. No free air. Musculoskeletal: Degenerative changes of the lumbar spine. IMPRESSION: Dominant 5.4 cm right lower lobe mass, corresponding to known pulmonary adenocarcinoma, grossly unchanged. Additional satellite nodularity/ tumor in the right upper and lower lobes. Thoracic nodal metastases, as above, grossly unchanged. Suspected 1.7 cm right adrenal metastasis, slightly more conspicuous than on the prior. Suspected sigmoid colitis. Small volume pelvic ascites. No free air. Additional ancillary findings as above. Electronically Signed   By: SJulian HyM.D.   On: 05/15/2016 08:08   Ct Abdomen Pelvis W Contrast  Result Date: 05/15/2016 CLINICAL DATA:  Stage IV adenocarcinoma, status post stereotactic radiation to brain in March 2018, diarrhea after chemotherapy, frequent falls, headache EXAM: CT CHEST, ABDOMEN, AND PELVIS WITH CONTRAST  TECHNIQUE: Multidetector CT imaging of the chest, abdomen and pelvis was performed following the standard protocol during bolus administration of intravenous contrast. CONTRAST:  1067mISOVUE-300 IOPAMIDOL (ISOVUE-300) INJECTION 61% COMPARISON:  PET-CT dated 03/13/2016 FINDINGS: CT CHEST FINDINGS Cardiovascular: Heart is normal in size.  Trace pericardial fluid. No evidence of thoracic aortic aneurysm. Mild atherosclerotic calcifications the aortic arch. Right chest port terminates in the upper SVC. Associated thrombus adjacent to the catheter tip (series 2/ image 25). Mediastinum/Nodes: Thoracic lymphadenopathy, similar to recent PET, including: --9 mm short axis high right paratracheal node (series 2/ image 25), previously 8 mm --11  mm short axis right paratracheal node (series 2/ image 29) previously 9 mm --2.4 cm short axis right hilar node (series 2/ image 35), previously 2.3 cm --8 mm short axis subcarinal node (series 2/ image 37), unchanged Visualized thyroid is unremarkable. Lungs/Pleura: 5.4 x 5.0 cm right lower lobe mass abutting the fissure (series 4/ image 107), corresponding to known lung adenocarcinoma, grossly unchanged. Adjacent satellite nodularity (series 4/images 92 and 121). Additional postobstructive opacity/ tumor in the right upper lobe, measuring at least 4.0 x 2.1 cm (series 4/ image 95), grossly unchanged. Associated satellite nodularity abutting the fissure (series 4/image 87). Underlying mild emphysematous changes. Left lung is essentially clear. No pleural effusion or pneumothorax. Musculoskeletal: No focal osseous lesions. CT ABDOMEN PELVIS FINDINGS Hepatobiliary: Liver is within normal limits. Gallbladder is unremarkable. No intrahepatic or extrahepatic ductal dilatation. Pancreas: Within normal limits. Spleen: Within normal limits. Adrenals/Urinary Tract: Mild thickening of the bilateral adrenal glands with a suspected 13 x 17 mm discrete right adrenal nodule (series 2/ image 68),  worrisome for small metastasis. Kidneys are within normal limits.  No hydronephrosis. Bladder is mildly thick-walled although underdistended. Stomach/Bowel: Stomach is within normal limits. No evidence of bowel obstruction. Normal appendix (series 2/ image 101). Sigmoid colon is underdistended with diverticulosis and associated mucosal hypertrophy. Superimposed sigmoid colitis is suspected given the clinical history of diarrhea. Vascular/Lymphatic: No evidence of abdominal aortic aneurysm. Atherosclerotic calcifications of the abdominal aorta and branch vessels. No suspicious abdominopelvic lymphadenopathy. Reproductive: Prostate is grossly unremarkable. Other: Small volume pelvic ascites. No free air. Musculoskeletal: Degenerative changes of the lumbar spine. IMPRESSION: Dominant 5.4 cm right lower lobe mass, corresponding to known pulmonary adenocarcinoma, grossly unchanged. Additional satellite nodularity/ tumor in the right upper and lower lobes. Thoracic nodal metastases, as above, grossly unchanged. Suspected 1.7 cm right adrenal metastasis, slightly more conspicuous than on the prior. Suspected sigmoid colitis. Small volume pelvic ascites. No free air. Additional ancillary findings as above. Electronically Signed   By: Julian Hy M.D.   On: 05/15/2016 08:08   Dg Hand Complete Right  Result Date: 05/19/2016 CLINICAL DATA:  Middle finger pain, swelling and blistering following injury 2 days ago. Initial encounter. EXAM: RIGHT HAND - COMPLETE 3+ VIEW COMPARISON:  None. FINDINGS: The mineralization and alignment are normal. There is no evidence of acute fracture or dislocation. The joint spaces are preserved. No evidence of foreign body or bone destruction. There is generalized soft tissue swelling in the long finger. IMPRESSION: Soft tissue swelling in the long finger without evidence of foreign body, bone destruction or acute osseous findings. Electronically Signed   By: Richardean Sale M.D.   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  Treated by Dr. Tressie Stalker at [facility] with Carboplatin/Pemetrexed every 21 days x 6 cycles (12/10/2014- 03/25/2015) with transition to maintenance Pemetrexed every 21 days with last dose being administered on 08/01/2015. Imaging demonstrated progression of disease, resulting in a change in therapy to [place] beginning on 08/29/2015- 10/24/2015.  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 Lisbeth Renshaw. PET scan on 10/31/2015 demonstrated progression of disease resulting in a change in therapy to Docetaxel and Ramucirumab beginning on 11/07/2015.  Restaging PET scan on 03/13/2016 demonstrated a mixed response.  CT imaging on 05/15/2016 demonstrates stability of disease.  Treatment continues at the patient's request with no  treatment options moving forward.  Oncology history is updated.  Pre-treatment labs ordered for next treatment: CBC diff, CMET, TSH, UA dipstick.  I personally reviewed and went over laboratory results with the patient.  The results are noted within this dictation.  Labs satisfy treatment parameters.  Hypocalcemia is noted.  Will add a Vit D and PTH level today.  Order is placed for IV calcium gluconate.  Corrected calcium is 7.24.    Hypokalemia also noted.  30 mEq IV K+ given today.  Also, 60 mEq PO K+.  Nitrites in urine are noted, suspicious for UTI.  I will send for urin culture.  Order is placed for urine culture.  We will need to continue to follow TSH given his previous Opdivo treatment.   I personally reviewed and went over radiographic studies with the patient.  The results are noted within this dictation.  CT imaging on 05/15/2016 demonstrates stability of disease.  No new sites of involvement.  Right #3 finger with white/yellow discharge noted, suspicious for infection.   Culture is performed.  Order is placed for xray to rule out osteomyelitis.  I have drained the infection as well.  Skin was prepped and sterilized in the usual fashion and 3 mL of 0.25% bupivacaine was injected 1 cm proximal to the PIP at midline of #3 finger.  Small amount of skin de-roofed and pressure was used to expel white fluid with blood.  Patient noted pain in the distal aspect of finger.  Rx for Keflex is escribed, 500 mg QID x 7 days.  Due to this infection, chemotherapy will be HELD x 1 week.  LIMITED CODE STATUS re-confirmed today.    Goals of care discussion is ongoing.  We discussed quality of life versus quantity of life.  He is educated that there will be a time in which his disease progresses and he is currently on his final treatment option.  He does not look well nor feel as well as he did 6-12 months ago.  He is resistant to stopping therapy.  We broached the topic of Hospice.  At this time, he is not interested in transitioning his care to comfort measure and would like to continue with treatment until progression of disease.  Problem list reviewed with patient and edited accordingly.  Medications are reviewed with the patient and edited accordingly.  Return in 3 weeks for follow-up and ongoing treatment.  More than 50% of the time spent with the patient was utilized for counseling and coordination of care.  40+ minutes spent in face-to-face encounter with patient.  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  On Xgeva monthly to reduce risk for SRE.  Xgeva due today, BUT will be held due to hypocalcemia.  Brain metastases ([place]) Intracranial brain metastases, S/P SRS on 04/22/2016.  Colitis Patient was seen by Dr. Gala Romney and was found to have a large pedunculated polyp in the distal descending colon, engorged hemorrhoids, and localized proctocolitis limited to the distal colon/rectum. Biopsies confirmed active colitis. Dr. Coralee North questions whether the patient may  have quiescent baseline IBD. He recommend to start patient on cortenemas (142m hydrocortisone rentention enemas one daily at bedtime and retained for 30 min), 1 week prior to next chemotherapy and continued for a month afterwards.   ORDERS PLACED FOR THIS ENCOUNTER: Orders Placed This Encounter  Procedures  . WOUND CULTURE  . DG Hand Complete Right  . Vitamin D 25 hydroxy  . PTH, intact and calcium    MEDICATIONS PRESCRIBED  THIS ENCOUNTER: Meds ordered this encounter  Medications  . calcium gluconate 2 g in sodium chloride 0.9 % 100 mL IVPB  . cephALEXin (KEFLEX) 500 MG capsule    Sig: Take 1 capsule (500 mg total) by mouth 4 (four) times daily.    Dispense:  28 capsule    Refill:  0    Order Specific Question:   Supervising Provider    Answer:   Brunetta Genera [[id number]]    THERAPY PLAN:  Continue treatment as outlined above: Docetaxel + Ramucirumab  All questions were answered. The patient knows to call the clinic with any problems, questions or concerns. We can certainly see the patient much sooner if necessary.  Patient and plan discussed with Dr. Twana First and she is in agreement with the aforementioned.   This note is electronically signed by: Doy Mince 05/19/2016 6:54 PM

## 2016-05-19 NOTE — Assessment & Plan Note (Addendum)
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  Treated by Dr. Tressie Stalker at [facility] with Carboplatin/Pemetrexed every 21 days x 6 cycles (12/10/2014- 03/25/2015) with transition to maintenance Pemetrexed every 21 days with last dose being administered on 08/01/2015. Imaging demonstrated progression of disease, resulting in a change in therapy to [place] beginning on 08/29/2015- 10/24/2015.  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 Lisbeth Renshaw. PET scan on 10/31/2015 demonstrated progression of disease resulting in a change in therapy to Docetaxel and Ramucirumab beginning on 11/07/2015.  Restaging PET scan on 03/13/2016 demonstrated a mixed response.  CT imaging on 05/15/2016 demonstrates stability of disease.  Treatment continues at the patient's request with no treatment options moving forward.  Oncology history is updated.  Pre-treatment labs ordered for next treatment: CBC diff, CMET, TSH, UA dipstick.  I personally reviewed and went over laboratory results with the patient.  The results are noted within this dictation.  Labs satisfy treatment parameters.  Hypocalcemia is noted.  Will add a Vit D and PTH level today.  Order is placed for IV calcium gluconate.  Corrected calcium is 7.24.    Hypokalemia also noted.  30 mEq IV K+ given today.  Also, 60 mEq PO K+.  Nitrites in urine are noted, suspicious for UTI.  I will send for urin culture.  Order is placed for urine culture.  We will need to continue to follow TSH given his previous Opdivo treatment.   I personally reviewed and went over radiographic studies with the patient.  The results are noted within this dictation.  CT imaging on 05/15/2016 demonstrates stability of disease.  No new sites of involvement.  Right #3 finger with white/yellow discharge noted, suspicious for infection.  Culture is performed.  Order is placed for xray to rule out osteomyelitis.  I have drained the  infection as well.  Skin was prepped and sterilized in the usual fashion and 3 mL of 0.25% bupivacaine was injected 1 cm proximal to the PIP at midline of #3 finger.  Small amount of skin de-roofed and pressure was used to expel white fluid with blood.  Patient noted pain in the distal aspect of finger.  Rx for Keflex is escribed, 500 mg QID x 7 days.  Due to this infection, chemotherapy will be HELD x 1 week.  LIMITED CODE STATUS re-confirmed today.    Goals of care discussion is ongoing.  We discussed quality of life versus quantity of life.  He is educated that there will be a time in which his disease progresses and he is currently on his final treatment option.  He does not look well nor feel as well as he did 6-12 months ago.  He is resistant to stopping therapy.  We broached the topic of Hospice.  At this time, he is not interested in transitioning his care to comfort measure and would like to continue with treatment until progression of disease.  Problem list reviewed with patient and edited accordingly.  Medications are reviewed with the patient and edited accordingly.  Return in 3 weeks for follow-up and ongoing treatment.  More than 50% of the time spent with the patient was utilized for counseling and coordination of care.  40+ minutes spent in face-to-face encounter with patient.

## 2016-05-19 NOTE — Progress Notes (Signed)
[place] Clinical Social Work  Clinical Social Work was referred by PA for assessment of psychosocial needs due to advanced cancer diagnosis, possible coping concerns. Clinical Social Worker met with patient, wife and daughter at [facility] to offer support and assess for needs.  CSW introduced self, explained role of CSW/Pt and Family Support Team, support groups and other resources to assist. Pt and wife open to meeting with CSW. They both feel things are going "ok" at home and report to have good support from extended family and friends who provide transportation, meals and assistance as needed. Pt was very open about his prognosis and verbalized his emotions regarding his illness very directly. CSW problem solved with wife and pt on some minor current care needs. They plan to get walkie talkies or a baby monitor to assist with communication. They report to have all DME at home they need currently. CSW encouraged pt and family to reach out to the team as care needs change. Pt wants to stay home as long as possible and strongly voiced "when hospice services start, I want to be home because they do not have my channels over there. I don't want people visiting, I just want to be at home".   CSW provided supportive listening and support. Wife became tearful. CSW discussed common emotions and difficulties that may arise. Pt and family appreciated visit and agree to reach out as needed.      Clinical Social Work interventions: Education Supportive listening  Loren Racer, [place], OSW-C [place] Tues[facility]   [phone]

## 2016-05-19 NOTE — Assessment & Plan Note (Signed)
Patient was seen by Dr. Gala Romney and was found to have a large pedunculated polyp in the distal descending colon, engorged hemorrhoids, and localized proctocolitis limited to the distal colon/rectum. Biopsies confirmed active colitis. Dr. Coralee North questions whether the patient may have quiescent baseline IBD. He recommend to start patient on cortenemas ('100mg'$  hydrocortisone rentention enemas one daily at bedtime and retained for 30 min), 1 week prior to next chemotherapy and continued for a month afterwards.

## 2016-05-19 NOTE — Progress Notes (Signed)
CRITICAL VALUE ALERT Critical value received:  Calcium-6.2 Date of notification:  05/19/16 Time of notification: 1225 Critical value read back:  Yes.   Nurse who received alert:  M.Skyleigh Windle, LPN MD notified (1st page):  G.Dawson, NP @ [phone]

## 2016-05-19 NOTE — Assessment & Plan Note (Addendum)
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  On Xgeva monthly to reduce risk for SRE.  Xgeva due today, BUT will be held due to hypocalcemia.

## 2016-05-19 NOTE — Patient Instructions (Signed)
[place] at [facility] Discharge Instructions  RECOMMENDATIONS MADE BY THE CONSULTANT AND ANY TEST RESULTS WILL BE SENT TO YOUR REFERRING PHYSICIAN.  Chemotherapy rescheduled for next week. Return as scheduled.  Thank you for choosing [place] at [facility] to provide your oncology and hematology care.  To afford each patient quality time with our provider, please arrive at least 15 minutes before your scheduled appointment time.    If you have a lab appointment with the [facility] please come in thru the  Main Entrance and check in at the main information desk  You need to re-schedule your appointment should you arrive 10 or more minutes late.  We strive to give you quality time with our providers, and arriving late affects you and other patients whose appointments are after yours.  Also, if you no show three or more times for appointments you may be dismissed from the clinic at the providers discretion.     Again, thank you for choosing [facility]  Our hope is that these requests will decrease the amount of time that you wait before being seen by our physicians.       _____________________________________________________________  Should you have questions after your visit to [facility], please contact our office at [phone] between the hours of 8:30 a.m. and 4:30 p.m.  Voicemails left after 4:30 p.m. will not be returned until the following business day.  For prescription refill requests, have your pharmacy contact our office.       Resources For Cancer Patients and their Caregivers ? American Cancer Society: Can assist with transportation, wigs, general needs, runs Look Good Feel Better.        586-670-5084 ? Cancer Care: Provides financial assistance, online support groups, medication/co-pay assistance.  1-800-813-HOPE 585 120 9987) ? [place] Assists [place] cancer  patients and their families through emotional , educational and financial support.  (705)761-8456 ? [place] DSS Where to apply for food stamps, Medicaid and utility assistance. 419-286-9764 ? [employer]: Transportation to medical appointments. (661) 299-4823 ? Social Security Administration: May apply for disability if have a Stage IV cancer. (334) 768-7286 (754)241-7429 ? [employer], Disability and Transit Services: Assists with nutrition, care and transit needs. [place] Support Programs: '@10RELATIVEDAYS'$ @ > Cancer Support Group  2nd Tuesday of the month 1pm-2pm, Journey Room  > Creative Journey  3rd Tuesday of the month 1130am-1pm, Journey Room  > Look Good Feel Better  1st Wednesday of the month 10am-12 noon, Journey Room (Call [place] to register [phone])

## 2016-05-19 NOTE — Assessment & Plan Note (Signed)
Intracranial brain metastases, S/P SRS on 04/22/2016.

## 2016-05-19 NOTE — Progress Notes (Signed)
Chemo held today due to infection in right hand 3rd finger. Calcium infusion and potassium IV plus po as ordered today.  Tolerated infusions well. Stable on discharge home with family via wheelchair.

## 2016-05-20 ENCOUNTER — Other Ambulatory Visit (HOSPITAL_COMMUNITY): Payer: Self-pay | Admitting: Oncology

## 2016-05-20 DIAGNOSIS — E559 Vitamin D deficiency, unspecified: Secondary | ICD-10-CM

## 2016-05-20 LAB — PTH, INTACT AND CALCIUM
CALCIUM TOTAL (PTH): 5.9 mg/dL — AB (ref 8.7–10.2)
PTH: 1023 pg/mL — AB (ref 15–65)

## 2016-05-20 LAB — VITAMIN D 25 HYDROXY (VIT D DEFICIENCY, FRACTURES): Vit D, 25-Hydroxy: 6.1 ng/mL — ABNORMAL LOW (ref 30.0–100.0)

## 2016-05-20 MED ORDER — ERGOCALCIFEROL 1.25 MG (50000 UT) PO CAPS: 50000.0000 [IU] | ORAL_CAPSULE | ORAL | 0 refills | Status: AC

## 2016-05-21 LAB — URINE CULTURE

## 2016-05-22 LAB — AEROBIC CULTURE  (SUPERFICIAL SPECIMEN)

## 2016-05-22 LAB — AEROBIC CULTURE W GRAM STAIN (SUPERFICIAL SPECIMEN)

## 2016-05-26 ENCOUNTER — Other Ambulatory Visit (HOSPITAL_COMMUNITY): Payer: Self-pay | Admitting: Oncology

## 2016-05-26 ENCOUNTER — Encounter (HOSPITAL_COMMUNITY): Payer: BLUE CROSS/BLUE SHIELD | Attending: Hematology & Oncology

## 2016-05-26 DIAGNOSIS — C7931 Secondary malignant neoplasm of brain: Secondary | ICD-10-CM | POA: Diagnosis not present

## 2016-05-26 DIAGNOSIS — C3491 Malignant neoplasm of unspecified part of right bronchus or lung: Secondary | ICD-10-CM | POA: Diagnosis not present

## 2016-05-26 DIAGNOSIS — Z5189 Encounter for other specified aftercare: Secondary | ICD-10-CM

## 2016-05-26 DIAGNOSIS — Z5111 Encounter for antineoplastic chemotherapy: Secondary | ICD-10-CM

## 2016-05-26 DIAGNOSIS — C7951 Secondary malignant neoplasm of bone: Secondary | ICD-10-CM

## 2016-05-26 DIAGNOSIS — Z5112 Encounter for antineoplastic immunotherapy: Secondary | ICD-10-CM

## 2016-05-26 LAB — COMPREHENSIVE METABOLIC PANEL
ALBUMIN: 2.7 g/dL — AB (ref 3.5–5.0)
ALK PHOS: 99 U/L (ref 38–126)
ALT: 15 U/L — AB (ref 17–63)
ANION GAP: 8 (ref 5–15)
AST: 28 U/L (ref 15–41)
BUN: 7 mg/dL (ref 6–20)
CALCIUM: 6.5 mg/dL — AB (ref 8.9–10.3)
CHLORIDE: 103 mmol/L (ref 101–111)
CO2: 22 mmol/L (ref 22–32)
Creatinine, Ser: 0.53 mg/dL — ABNORMAL LOW (ref 0.61–1.24)
GFR calc non Af Amer: >60 mL/min (ref >60)
GLUCOSE: 180 mg/dL — AB (ref 65–99)
Potassium: 3.9 mmol/L (ref 3.5–5.1)
SODIUM: 133 mmol/L — AB (ref 135–145)
Total Bilirubin: 0.7 mg/dL (ref 0.3–1.2)
Total Protein: 5.8 g/dL — ABNORMAL LOW (ref 6.5–8.1)

## 2016-05-26 LAB — URINALYSIS, DIPSTICK ONLY
Glucose, UA: 100 mg/dL — AB
Hgb urine dipstick: NEGATIVE
Leukocytes, UA: NEGATIVE
NITRITE: NEGATIVE
PH: 5.5 (ref 5.0–8.0)
Protein, ur: 100 mg/dL — AB
SPECIFIC GRAVITY, URINE: 1.025 (ref 1.005–1.030)

## 2016-05-26 LAB — CBC WITH DIFFERENTIAL/PLATELET
BASOS PCT: 0 %
Basophils Absolute: 0 10*3/uL (ref 0.0–0.1)
EOS ABS: 0 10*3/uL (ref 0.0–0.7)
EOS PCT: 0 %
HCT: 39.5 % (ref 39.0–52.0)
HEMOGLOBIN: 12.9 g/dL — AB (ref 13.0–17.0)
Lymphocytes Relative: 15 %
Lymphs Abs: 0.7 10*3/uL (ref 0.7–4.0)
MCH: 33.7 pg (ref 26.0–34.0)
MCHC: 32.7 g/dL (ref 30.0–36.0)
MCV: 103.1 fL — ABNORMAL HIGH (ref 78.0–100.0)
Monocytes Absolute: 0.2 10*3/uL (ref 0.1–1.0)
Monocytes Relative: 4 %
NEUTROS PCT: 81 %
Neutro Abs: 3.6 10*3/uL (ref 1.7–7.7)
PLATELETS: 244 10*3/uL (ref 150–400)
RBC: 3.83 MIL/uL — AB (ref 4.22–5.81)
RDW: 16.5 % — ABNORMAL HIGH (ref 11.5–15.5)
WBC: 4.5 10*3/uL (ref 4.0–10.5)

## 2016-05-26 LAB — TSH: TSH: 0.406 u[IU]/mL (ref 0.350–4.500)

## 2016-05-26 MED ORDER — FISH OIL 1000 MG PO CAPS: 1.0000 | ORAL_CAPSULE | Freq: Every day | ORAL | 0 refills | Status: DC

## 2016-05-26 MED ORDER — SODIUM CHLORIDE 0.9 % IV SOLN
2.0000 g | Freq: Once | INTRAVENOUS | Status: AC
  Administered 2016-05-26: 2 g via INTRAVENOUS
  Filled 2016-05-26: qty 20

## 2016-05-26 MED ORDER — HEPARIN SOD (PORK) LOCK FLUSH 100 UNIT/ML IV SOLN
500.0000 [IU] | Freq: Once | INTRAVENOUS | Status: AC | PRN
  Administered 2016-05-26: 500 [IU]
  Filled 2016-05-26: qty 5

## 2016-05-26 MED ORDER — SODIUM CHLORIDE 0.9 % IV SOLN
Freq: Once | INTRAVENOUS | Status: AC
  Administered 2016-05-26: 12:00:00 via INTRAVENOUS

## 2016-05-26 MED ORDER — DEXAMETHASONE SODIUM PHOSPHATE 10 MG/ML IJ SOLN
10.0000 mg | Freq: Once | INTRAMUSCULAR | Status: AC
  Administered 2016-05-26: 10 mg via INTRAVENOUS
  Filled 2016-05-26: qty 1

## 2016-05-26 MED ORDER — DIPHENHYDRAMINE HCL 50 MG/ML IJ SOLN
50.0000 mg | Freq: Once | INTRAMUSCULAR | Status: AC
  Administered 2016-05-26: 50 mg via INTRAVENOUS
  Filled 2016-05-26: qty 1

## 2016-05-26 MED ORDER — SODIUM CHLORIDE 0.9 % IV SOLN
10.0000 mg/kg | Freq: Once | INTRAVENOUS | Status: AC
  Administered 2016-05-26: 900 mg via INTRAVENOUS
  Filled 2016-05-26: qty 40

## 2016-05-26 MED ORDER — FOLIC ACID 1 MG PO TABS: 1.0000 mg | ORAL_TABLET | Freq: Every day | ORAL | 0 refills | Status: AC

## 2016-05-26 MED ORDER — ACETAMINOPHEN 325 MG PO TABS
650.0000 mg | ORAL_TABLET | Freq: Once | ORAL | Status: AC
  Administered 2016-05-26: 650 mg via ORAL
  Filled 2016-05-26: qty 2

## 2016-05-26 MED ORDER — DOCETAXEL CHEMO INJECTION 160 MG/16ML
75.0000 mg/m2 | Freq: Once | INTRAVENOUS | Status: AC
  Administered 2016-05-26: 160 mg via INTRAVENOUS
  Filled 2016-05-26: qty 16

## 2016-05-26 MED ORDER — PEGFILGRASTIM 6 MG/0.6ML ~~LOC~~ PSKT
6.0000 mg | PREFILLED_SYRINGE | Freq: Once | SUBCUTANEOUS | Status: AC
  Administered 2016-05-26: 6 mg via SUBCUTANEOUS
  Filled 2016-05-26: qty 0.6

## 2016-05-26 NOTE — Patient Instructions (Signed)
[facility] Discharge Instructions for Patients Receiving Chemotherapy   Beginning January 23rd 2017 lab work for the [facility] will be done in the  Main lab at [facility] on 1st floor. If you have a lab appointment with the [facility] please come in thru the  Main Entrance and check in at the main information desk   Today you received the following chemotherapy agents taxotere and cyramza.  To help prevent nausea and vomiting after your treatment, we encourage you to take your nausea medication as instructed.  Neulasta device will dispense medication between 5 pm and 6 pm tomorrow Wed May 2. You may remove device after 6 pm tomorrow.   If you develop nausea and vomiting, or diarrhea that is not controlled by your medication, call the clinic.  The clinic phone number is [phone]. Office hours are Monday-Friday 8:30am-5:00pm.  BELOW ARE SYMPTOMS THAT SHOULD BE REPORTED IMMEDIATELY:  *FEVER GREATER THAN 101.0 F  *CHILLS WITH OR WITHOUT FEVER  NAUSEA AND VOMITING THAT IS NOT CONTROLLED WITH YOUR NAUSEA MEDICATION  *UNUSUAL SHORTNESS OF BREATH  *UNUSUAL BRUISING OR BLEEDING  TENDERNESS IN MOUTH AND THROAT WITH OR WITHOUT PRESENCE OF ULCERS  *URINARY PROBLEMS  *BOWEL PROBLEMS  UNUSUAL RASH Items with * indicate a potential emergency and should be followed up as soon as possible. If you have an emergency after office hours please contact your primary care physician or go to the nearest emergency department.  Please call the clinic during office hours if you have any questions or concerns.   You may also contact the Patient Navigator at [phone] should you have any questions or need assistance in obtaining follow up care.      Resources For Cancer Patients and their Caregivers ? American Cancer Society: Can assist with transportation, wigs, general needs, runs Look Good Feel Better.        336-006-4828 ? Cancer Care: Provides financial  assistance, online support groups, medication/co-pay assistance.  1-800-813-HOPE 940 304 9343) ? [place] Assists [place] cancer patients and their families through emotional , educational and financial support.  401-407-5632 ? [place] DSS Where to apply for food stamps, Medicaid and utility assistance. [phone] ? [employer]: Transportation to medical appointments. 432-424-6062 ? Social Security Administration: May apply for disability if have a Stage IV cancer. 579-731-9752 [phone])[phone] ? [employer], Disability and Transit Services: Assists with nutrition, care and transit needs. 682-168-8903

## 2016-05-26 NOTE — Progress Notes (Signed)
Per Dr.Zhou and T.Kefalas,PA, patient is ok for all chemo treatment today. Tolerated chemo well. Carl KitchenPaschal Simmons arrived today for [facility] neulasta on body injector. See MAR for administration details. Injector in place and engaged with green light indicator on flashing. Tolerated application with out problems. Stable on discharge home with sister via wheelchair.

## 2016-06-02 ENCOUNTER — Ambulatory Visit: Payer: Self-pay | Admitting: Radiation Oncology

## 2016-06-03 NOTE — Progress Notes (Addendum)
Carl Simmons 56 y.o. man with non-small cell carcinoma, adenocarcinoma of the right lung, SRS to brain 3/28-18, one month FU.  Headache:None,has a bandaid on the back of his head  right side no drainage no signs of infection. Dizziness:None Nausea/vomiting:None Diplopia:None Ringing in ears:None Visual changes:Denies visual problems Fatigue:Having fatigue all the time. Cognitive changes:Alert and oriented with a fluent speech.  No memory issues. Appetite :Eating states he has no taste. Pain:No pain  Respiratory issues:SOB with exertion. Reports he fell about three weeks ago weaker now, has an abrasion on right elbow and right knee. Imaging:05-14-16 CT chest w wo contrast, CT head w w wo contrast,Ct abdomen pelvis w contrast Lab work from of chart:05-26-16 CBC w diff, Cmet, TSH Wt Readings from Last 3 Encounters:  06/09/16 191 lb 9.6 oz (86.9 kg)  05/26/16 195 lb 11.2 oz (88.8 kg)  05/19/16 196 lb 9.6 oz (89.2 kg)  BP 105/65   Pulse 100   Temp 97.7 F (36.5 C) (Oral)   Resp 18   Ht '5\' 8"'$  (1.727 m)   Wt 191 lb 9.6 oz (86.9 kg)   SpO2 94%   BMI 29.13 kg/m

## 2016-06-06 ENCOUNTER — Other Ambulatory Visit (HOSPITAL_COMMUNITY): Payer: Self-pay | Admitting: Oncology

## 2016-06-09 ENCOUNTER — Ambulatory Visit (HOSPITAL_COMMUNITY): Payer: BLUE CROSS/BLUE SHIELD

## 2016-06-09 ENCOUNTER — Ambulatory Visit
Admission: RE | Admit: 2016-06-09 | Discharge: 2016-06-09 | Disposition: A | Discharge status: Home / Self Care | Payer: BLUE CROSS/BLUE SHIELD | Source: Ambulatory Visit | Attending: Radiation Oncology | Admitting: Radiation Oncology

## 2016-06-09 VITALS — BP 105/65 | HR 100 | Temp 97.7°F | Resp 18 | Ht 68.0 in | Wt 191.6 lb

## 2016-06-09 DIAGNOSIS — C3431 Malignant neoplasm of lower lobe, right bronchus or lung: Secondary | ICD-10-CM

## 2016-06-09 DIAGNOSIS — C7931 Secondary malignant neoplasm of brain: Secondary | ICD-10-CM

## 2016-06-09 DIAGNOSIS — Z510 Encounter for antineoplastic radiation therapy: Secondary | ICD-10-CM | POA: Diagnosis not present

## 2016-06-09 NOTE — Addendum Note (Signed)
Encounter addended by: Malena Edman, RN on: 06/09/2016 10:40 AM<BR>    Actions taken: Charge Capture section accepted

## 2016-06-09 NOTE — Progress Notes (Addendum)
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   Interval Since Last Radiation:  7 weeks   04/22/16 SRS Treatment: 1. PTV11 Brainstem 23m: 14 Gy in 1 fraction 2. PTV12 Right Cerebellum 537m 20 Gy in 1 fraction 3. PTV13 Right Temporal 37m71m20 Gy in 1 fraction 4. PTV14 Left Frontal 4mm55m0 Gy in 1 fraction  09/27/2015 SRS Treatment: PTV target 1-10 were treated using 2 treatment plans. A single isocenter technique was utilized to treat 8 lesion simultaneously. PTV's 2 and 5 were then treated simultaneously with a second radiation treatment plan due to their close proximity. A prescription dose of 20 Gy was delivered to each lesion. ExacTrac Snap verification was performed for each couch angle.  Narrative:  The patient returns today for routine follow-up. He tolerated radiotherapy well and comes today for follow up.  On review of systems, the patient states he's doing well overall. He reports he continues to have a scab at the site of a previous biopsy from 2016 that continues to heal partially but hasn't completely healed. This is dressed with a bandage, and he continues to pick at the eschars that form. He continues to have changes in visual acuity which has been gradual prior to radiotherapy and is attributed to his chemotherapy. No other complaints are verbalized.  ALLERGIES:  has No Known Allergies.  Meds: Current Outpatient Prescriptions  Medication Sig Dispense Refill  . cephALEXin (KEFLEX) 500 MG capsule Take 1 capsule (500 mg total) by mouth 4 (four) times daily. 28 capsule 0  . cyanocobalamin (,VITAMIN B-12,) 1000 MCG/ML injection Inject 1,000 mcg into the muscle every 30 (thirty) days.    .  dMarland Kitchenxamethasone (DECADRON) 4 MG tablet TAKE 2 TABLETS BY MOUTH TWICE DAILY - START THE DAY BEFORE TAXOTERE, THEN DAILY AFTER CHEMO FOR 2 DAYS 30 tablet 2  . ergocalciferol (VITAMIN D2) 50000 units capsule Take 1 capsule (50,000 Units total) by mouth once a week. 12 capsule 0  . folic acid (FOLVITE) 1 MG tablet Take 1 tablet (1 mg total) by mouth daily. 90 tablet 0  . loperamide (IMODIUM) 2 MG capsule Take 1 capsule (2 mg total) by mouth 4 (four) times daily as needed for diarrhea or loose stools. 60 capsule 0  . omega-3 acid ethyl esters (LOVAZA) 1 g capsule TAKE 1 CAPSULE BY MOUTH EVERY DAY 30 capsule 3  . omeprazole (PRILOSEC) 20 MG capsule TAKE 1 CAPSULE BY MOUTH EVERY DAY 30 capsule 2  . phosphorus (PHOSPHA 250 NEUTRAL) 155-852-130 MG tablet Take 1 tablet (250 mg total) by mouth 3 (three) times daily. 90 tablet 0  . potassium chloride SA (K-DUR,KLOR-CON) 20 MEQ tablet Take 1 tablet (20 mEq total) by mouth 2 (two) times daily. 60 tablet 0  . hydrocortisone (CORTENEMA) 100 MG/60ML enema Place 1 enema (100 mg total) rectally at bedtime. Retain for 30 minutes daily at bedtime. 30 enema 0  . naproxen (NAPROSYN) 500 MG tablet   0  . polyethylene glycol-electrolytes (TRILYTE) 420 g solution Take 4,000 mLs by mouth as directed. (Patient not taking: Reported on 06/09/2016) 4000 mL 0  . PROAIR HFA 108 (90 Base) MCG/ACT inhaler Inhale 1 puff into the lungs daily as needed  for shortness of breath.  0   No current facility-administered medications for this encounter.     Physical Findings:  height is '5\' 8"'$  (1.727 m) and weight is 191 lb 9.6 oz (86.9 kg). His oral temperature is 97.7 F (36.5 C). His blood pressure is 105/65 and his pulse is 100. His respiration is 18 and oxygen saturation is 94%.  Pain Assessment Pain Score: 0-No pain/10 In general this is a well appearing Caucasian male in no acute distress. He's alert and oriented x4 and appropriate throughout the examination. Cardiopulmonary assessment  is negative for acute distress and he exhibits normal effort. His scalp is intact without desquamation. Along the right parietal region he has a bandage that is covering an eschar. On the dressing there is serous fluid, but no evidence of cellulitic change.  Lab Findings: Lab Results  Component Value Date   WBC 4.5 05/26/2016   HGB 12.9 (L) 05/26/2016   HCT 39.5 05/26/2016   MCV 103.1 (H) 05/26/2016   PLT 244 05/26/2016     Radiographic Findings: Ct Head W Wo Contrast  Result Date: 05/14/2016 CLINICAL DATA:  Stage IV adenocarcinoma. Frequent falling. Headache. Previous radiation to brain metastases. EXAM: CT HEAD WITHOUT AND WITH CONTRAST TECHNIQUE: Contiguous axial images were obtained from the base of the skull through the vertex without and with intravenous contrast CONTRAST:  182m ISOVUE-300 IOPAMIDOL (ISOVUE-300) INJECTION 61% COMPARISON:  MRI 04/02/2016 FINDINGS: Brain: The brain has normal appearance by CT. No evidence of atrophy, old or acute infarction, mass lesion, hemorrhage, hydrocephalus or extra-axial collection. No abnormal contrast enhancement occurs. Vascular: No abnormal vascular finding. Skull: Negative Sinuses/Orbits: Clear/normal Other: Previous scalp per section on the right. IMPRESSION: Negative CT. Tiny metastatic lesions demonstrated by MRI on 04/02/2016 are not visible utilizing CT. No statement can be made about whether these have been successfully treated or are simply not visible by this less sensitive technique. Electronically Signed   By: MNelson ChimesM.D.   On: 05/14/2016 18:37   Ct Chest W Contrast  Result Date: 05/15/2016 CLINICAL DATA:  Stage IV adenocarcinoma, status post stereotactic radiation to brain in March 2018, diarrhea after chemotherapy, frequent falls, headache EXAM: CT CHEST, ABDOMEN, AND PELVIS WITH CONTRAST TECHNIQUE: Multidetector CT imaging of the chest, abdomen and pelvis was performed following the standard protocol during bolus administration  of intravenous contrast. CONTRAST:  1020mISOVUE-300 IOPAMIDOL (ISOVUE-300) INJECTION 61% COMPARISON:  PET-CT dated 03/13/2016 FINDINGS: CT CHEST FINDINGS Cardiovascular: Heart is normal in size.  Trace pericardial fluid. No evidence of thoracic aortic aneurysm. Mild atherosclerotic calcifications the aortic arch. Right chest port terminates in the upper SVC. Associated thrombus adjacent to the catheter tip (series 2/ image 25). Mediastinum/Nodes: Thoracic lymphadenopathy, similar to recent PET, including: --9 mm short axis high right paratracheal node (series 2/ image 25), previously 8 mm --11 mm short axis right paratracheal node (series 2/ image 29) previously 9 mm --2.4 cm short axis right hilar node (series 2/ image 35), previously 2.3 cm --8 mm short axis subcarinal node (series 2/ image 37), unchanged Visualized thyroid is unremarkable. Lungs/Pleura: 5.4 x 5.0 cm right lower lobe mass abutting the fissure (series 4/ image 107), corresponding to known lung adenocarcinoma, grossly unchanged. Adjacent satellite nodularity (series 4/images 92 and 121). Additional postobstructive opacity/ tumor in the right upper lobe, measuring at least 4.0 x 2.1 cm (series 4/ image 95), grossly unchanged. Associated satellite nodularity abutting the fissure (series 4/image 87). Underlying mild emphysematous changes. Left lung is essentially clear. No  pleural effusion or pneumothorax. Musculoskeletal: No focal osseous lesions. CT ABDOMEN PELVIS FINDINGS Hepatobiliary: Liver is within normal limits. Gallbladder is unremarkable. No intrahepatic or extrahepatic ductal dilatation. Pancreas: Within normal limits. Spleen: Within normal limits. Adrenals/Urinary Tract: Mild thickening of the bilateral adrenal glands with a suspected 13 x 17 mm discrete right adrenal nodule (series 2/ image 68), worrisome for small metastasis. Kidneys are within normal limits.  No hydronephrosis. Bladder is mildly thick-walled although underdistended.  Stomach/Bowel: Stomach is within normal limits. No evidence of bowel obstruction. Normal appendix (series 2/ image 101). Sigmoid colon is underdistended with diverticulosis and associated mucosal hypertrophy. Superimposed sigmoid colitis is suspected given the clinical history of diarrhea. Vascular/Lymphatic: No evidence of abdominal aortic aneurysm. Atherosclerotic calcifications of the abdominal aorta and branch vessels. No suspicious abdominopelvic lymphadenopathy. Reproductive: Prostate is grossly unremarkable. Other: Small volume pelvic ascites. No free air. Musculoskeletal: Degenerative changes of the lumbar spine. IMPRESSION: Dominant 5.4 cm right lower lobe mass, corresponding to known pulmonary adenocarcinoma, grossly unchanged. Additional satellite nodularity/ tumor in the right upper and lower lobes. Thoracic nodal metastases, as above, grossly unchanged. Suspected 1.7 cm right adrenal metastasis, slightly more conspicuous than on the prior. Suspected sigmoid colitis. Small volume pelvic ascites. No free air. Additional ancillary findings as above. Electronically Signed   By: Julian Hy M.D.   On: 05/15/2016 08:08   Ct Abdomen Pelvis W Contrast  Result Date: 05/15/2016 CLINICAL DATA:  Stage IV adenocarcinoma, status post stereotactic radiation to brain in March 2018, diarrhea after chemotherapy, frequent falls, headache EXAM: CT CHEST, ABDOMEN, AND PELVIS WITH CONTRAST TECHNIQUE: Multidetector CT imaging of the chest, abdomen and pelvis was performed following the standard protocol during bolus administration of intravenous contrast. CONTRAST:  162m ISOVUE-300 IOPAMIDOL (ISOVUE-300) INJECTION 61% COMPARISON:  PET-CT dated 03/13/2016 FINDINGS: CT CHEST FINDINGS Cardiovascular: Heart is normal in size.  Trace pericardial fluid. No evidence of thoracic aortic aneurysm. Mild atherosclerotic calcifications the aortic arch. Right chest port terminates in the upper SVC. Associated thrombus adjacent  to the catheter tip (series 2/ image 25). Mediastinum/Nodes: Thoracic lymphadenopathy, similar to recent PET, including: --9 mm short axis high right paratracheal node (series 2/ image 25), previously 8 mm --11 mm short axis right paratracheal node (series 2/ image 29) previously 9 mm --2.4 cm short axis right hilar node (series 2/ image 35), previously 2.3 cm --8 mm short axis subcarinal node (series 2/ image 37), unchanged Visualized thyroid is unremarkable. Lungs/Pleura: 5.4 x 5.0 cm right lower lobe mass abutting the fissure (series 4/ image 107), corresponding to known lung adenocarcinoma, grossly unchanged. Adjacent satellite nodularity (series 4/images 92 and 121). Additional postobstructive opacity/ tumor in the right upper lobe, measuring at least 4.0 x 2.1 cm (series 4/ image 95), grossly unchanged. Associated satellite nodularity abutting the fissure (series 4/image 87). Underlying mild emphysematous changes. Left lung is essentially clear. No pleural effusion or pneumothorax. Musculoskeletal: No focal osseous lesions. CT ABDOMEN PELVIS FINDINGS Hepatobiliary: Liver is within normal limits. Gallbladder is unremarkable. No intrahepatic or extrahepatic ductal dilatation. Pancreas: Within normal limits. Spleen: Within normal limits. Adrenals/Urinary Tract: Mild thickening of the bilateral adrenal glands with a suspected 13 x 17 mm discrete right adrenal nodule (series 2/ image 68), worrisome for small metastasis. Kidneys are within normal limits.  No hydronephrosis. Bladder is mildly thick-walled although underdistended. Stomach/Bowel: Stomach is within normal limits. No evidence of bowel obstruction. Normal appendix (series 2/ image 101). Sigmoid colon is underdistended with diverticulosis and associated mucosal hypertrophy. Superimposed sigmoid colitis is  suspected given the clinical history of diarrhea. Vascular/Lymphatic: No evidence of abdominal aortic aneurysm. Atherosclerotic calcifications of the  abdominal aorta and branch vessels. No suspicious abdominopelvic lymphadenopathy. Reproductive: Prostate is grossly unremarkable. Other: Small volume pelvic ascites. No free air. Musculoskeletal: Degenerative changes of the lumbar spine. IMPRESSION: Dominant 5.4 cm right lower lobe mass, corresponding to known pulmonary adenocarcinoma, grossly unchanged. Additional satellite nodularity/ tumor in the right upper and lower lobes. Thoracic nodal metastases, as above, grossly unchanged. Suspected 1.7 cm right adrenal metastasis, slightly more conspicuous than on the prior. Suspected sigmoid colitis. Small volume pelvic ascites. No free air. Additional ancillary findings as above. Electronically Signed   By: Julian Hy M.D.   On: 05/15/2016 08:08   Dg Hand Complete Right  Result Date: 05/19/2016 CLINICAL DATA:  Middle finger pain, swelling and blistering following injury 2 days ago. Initial encounter. EXAM: RIGHT HAND - COMPLETE 3+ VIEW COMPARISON:  None. FINDINGS: The mineralization and alignment are normal. There is no evidence of acute fracture or dislocation. The joint spaces are preserved. No evidence of foreign body or bone destruction. There is generalized soft tissue swelling in the long finger. IMPRESSION: Soft tissue swelling in the long finger without evidence of foreign body, bone destruction or acute osseous findings. Electronically Signed   By: Richardean Sale M.D.   On: 05/19/2016 13:16    Impression/Plan: 1. 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 The patient appears to be doing well since completing radiotherapy. He's due for his next Cyramza infectuion on 06/16/16 and will continue to follow up in the [facility] Oncology clinic. We will plan for his post treatment MRI at the end of June 2018. We will review these results in person at that time. 2. Persistent wound along right parietal scalp. I will defer to medical oncology  as they appear to be managing this, but would consider wound care evaluation if this progresses.  3. Goals of care. The patient mentions several times that he's close to his "expiration date" and was told he would have 2 years to live with treatment. He is counseled on the role of meeting with palliative care for assessment and resources at his next visit.      Carola Rhine, PAC

## 2016-06-16 ENCOUNTER — Encounter (HOSPITAL_COMMUNITY): Payer: BLUE CROSS/BLUE SHIELD | Admitting: Dietician

## 2016-06-16 ENCOUNTER — Encounter (HOSPITAL_COMMUNITY): Payer: Self-pay | Admitting: Lab

## 2016-06-16 ENCOUNTER — Encounter (HOSPITAL_COMMUNITY): Payer: Self-pay

## 2016-06-16 ENCOUNTER — Encounter (HOSPITAL_BASED_OUTPATIENT_CLINIC_OR_DEPARTMENT_OTHER): Payer: BLUE CROSS/BLUE SHIELD | Admitting: Oncology

## 2016-06-16 ENCOUNTER — Encounter (HOSPITAL_BASED_OUTPATIENT_CLINIC_OR_DEPARTMENT_OTHER): Payer: BLUE CROSS/BLUE SHIELD

## 2016-06-16 ENCOUNTER — Other Ambulatory Visit (HOSPITAL_COMMUNITY): Payer: BLUE CROSS/BLUE SHIELD

## 2016-06-16 ENCOUNTER — Telehealth (HOSPITAL_COMMUNITY): Payer: Self-pay | Admitting: *Deleted

## 2016-06-16 VITALS — BP 128/92 | HR 90 | Temp 97.6°F | Resp 18

## 2016-06-16 VITALS — BP 92/70 | HR 129 | Temp 97.5°F | Resp 24 | Wt 196.9 lb

## 2016-06-16 DIAGNOSIS — Z5189 Encounter for other specified aftercare: Secondary | ICD-10-CM

## 2016-06-16 DIAGNOSIS — C3491 Malignant neoplasm of unspecified part of right bronchus or lung: Secondary | ICD-10-CM

## 2016-06-16 DIAGNOSIS — E559 Vitamin D deficiency, unspecified: Secondary | ICD-10-CM

## 2016-06-16 DIAGNOSIS — C7931 Secondary malignant neoplasm of brain: Secondary | ICD-10-CM | POA: Diagnosis not present

## 2016-06-16 DIAGNOSIS — Z5111 Encounter for antineoplastic chemotherapy: Secondary | ICD-10-CM

## 2016-06-16 DIAGNOSIS — C7951 Secondary malignant neoplasm of bone: Secondary | ICD-10-CM

## 2016-06-16 LAB — COMPREHENSIVE METABOLIC PANEL
ALBUMIN: 2.6 g/dL — AB (ref 3.5–5.0)
ALT: 13 U/L — AB (ref 17–63)
ANION GAP: 12 (ref 5–15)
AST: 36 U/L (ref 15–41)
Alkaline Phosphatase: 111 U/L (ref 38–126)
BUN: 11 mg/dL (ref 6–20)
CO2: 22 mmol/L (ref 22–32)
Calcium: 7.6 mg/dL — ABNORMAL LOW (ref 8.9–10.3)
Chloride: 98 mmol/L — ABNORMAL LOW (ref 101–111)
Creatinine, Ser: 0.68 mg/dL (ref 0.61–1.24)
GFR calc Af Amer: >60 mL/min (ref >60)
GFR calc non Af Amer: >60 mL/min (ref >60)
GLUCOSE: 137 mg/dL — AB (ref 65–99)
Potassium: 3.7 mmol/L (ref 3.5–5.1)
SODIUM: 132 mmol/L — AB (ref 135–145)
Total Bilirubin: 1.2 mg/dL (ref 0.3–1.2)
Total Protein: 5.5 g/dL — ABNORMAL LOW (ref 6.5–8.1)

## 2016-06-16 LAB — CBC WITH DIFFERENTIAL/PLATELET
BASOS ABS: 0 10*3/uL (ref 0.0–0.1)
BASOS PCT: 0 %
EOS ABS: 0 10*3/uL (ref 0.0–0.7)
Eosinophils Relative: 0 %
HEMATOCRIT: 38.4 % — AB (ref 39.0–52.0)
HEMOGLOBIN: 12.8 g/dL — AB (ref 13.0–17.0)
Lymphocytes Relative: 11 %
Lymphs Abs: 1.2 10*3/uL (ref 0.7–4.0)
MCH: 34 pg (ref 26.0–34.0)
MCHC: 33.3 g/dL (ref 30.0–36.0)
MCV: 101.9 fL — ABNORMAL HIGH (ref 78.0–100.0)
MONOS PCT: 4 %
Monocytes Absolute: 0.4 10*3/uL (ref 0.1–1.0)
NEUTROS ABS: 9.5 10*3/uL — AB (ref 1.7–7.7)
NEUTROS PCT: 85 %
Platelets: 233 10*3/uL (ref 150–400)
RBC: 3.77 MIL/uL — AB (ref 4.22–5.81)
RDW: 16.3 % — ABNORMAL HIGH (ref 11.5–15.5)
WBC: 11.2 10*3/uL — AB (ref 4.0–10.5)

## 2016-06-16 LAB — TSH: TSH: 1.455 u[IU]/mL (ref 0.350–4.500)

## 2016-06-16 MED ORDER — SODIUM CHLORIDE 0.9 % IV SOLN
Freq: Once | INTRAVENOUS | Status: AC
  Administered 2016-06-16: 12:00:00 via INTRAVENOUS

## 2016-06-16 MED ORDER — SODIUM CHLORIDE 0.9 % IV SOLN
10.0000 mg/kg | Freq: Once | INTRAVENOUS | Status: AC
  Administered 2016-06-16: 900 mg via INTRAVENOUS
  Filled 2016-06-16: qty 50

## 2016-06-16 MED ORDER — HEPARIN SOD (PORK) LOCK FLUSH 100 UNIT/ML IV SOLN
500.0000 [IU] | Freq: Once | INTRAVENOUS | Status: AC | PRN
  Administered 2016-06-16: 500 [IU]
  Filled 2016-06-16: qty 5

## 2016-06-16 MED ORDER — DEXAMETHASONE SODIUM PHOSPHATE 10 MG/ML IJ SOLN
10.0000 mg | Freq: Once | INTRAMUSCULAR | Status: AC
  Administered 2016-06-16: 10 mg via INTRAVENOUS
  Filled 2016-06-16: qty 1

## 2016-06-16 MED ORDER — DIPHENHYDRAMINE HCL 50 MG/ML IJ SOLN
50.0000 mg | Freq: Once | INTRAMUSCULAR | Status: AC
  Administered 2016-06-16: 50 mg via INTRAVENOUS
  Filled 2016-06-16: qty 1

## 2016-06-16 MED ORDER — PEGFILGRASTIM 6 MG/0.6ML ~~LOC~~ PSKT
6.0000 mg | PREFILLED_SYRINGE | Freq: Once | SUBCUTANEOUS | Status: AC
  Administered 2016-06-16: 6 mg via SUBCUTANEOUS
  Filled 2016-06-16: qty 0.6

## 2016-06-16 MED ORDER — DOCETAXEL CHEMO INJECTION 160 MG/16ML
75.0000 mg/m2 | Freq: Once | INTRAVENOUS | Status: AC
  Administered 2016-06-16: 160 mg via INTRAVENOUS
  Filled 2016-06-16: qty 16

## 2016-06-16 MED ORDER — ACETAMINOPHEN 325 MG PO TABS
650.0000 mg | ORAL_TABLET | Freq: Once | ORAL | Status: AC
  Administered 2016-06-16: 650 mg via ORAL
  Filled 2016-06-16: qty 2

## 2016-06-16 MED ORDER — DEXAMETHASONE SODIUM PHOSPHATE 10 MG/ML IJ SOLN
INTRAMUSCULAR | Status: AC
Start: 2016-06-16 — End: 2016-06-16
  Filled 2016-06-16: qty 1

## 2016-06-16 NOTE — Patient Instructions (Signed)
[facility] Discharge Instructions for Patients Receiving Chemotherapy   Beginning January 23rd 2017 lab work for the [facility] will be done in the  Main lab at [facility] on 1st floor. If you have a lab appointment with the [facility] please come in thru the  Main Entrance and check in at the main information desk   Today you received the following chemotherapy agents taxotere and cyramza. To help prevent nausea and vomiting after your treatment, we encourage you to take your nausea medication as instructed. Neulasta device may be taken off after 6 pm tomorrow.   If you develop nausea and vomiting, or diarrhea that is not controlled by your medication, call the clinic.  The clinic phone number is [phone]. Office hours are Monday-Friday 8:30am-5:00pm.  BELOW ARE SYMPTOMS THAT SHOULD BE REPORTED IMMEDIATELY:  *FEVER GREATER THAN 101.0 F  *CHILLS WITH OR WITHOUT FEVER  NAUSEA AND VOMITING THAT IS NOT CONTROLLED WITH YOUR NAUSEA MEDICATION  *UNUSUAL SHORTNESS OF BREATH  *UNUSUAL BRUISING OR BLEEDING  TENDERNESS IN MOUTH AND THROAT WITH OR WITHOUT PRESENCE OF ULCERS  *URINARY PROBLEMS  *BOWEL PROBLEMS  UNUSUAL RASH Items with * indicate a potential emergency and should be followed up as soon as possible. If you have an emergency after office hours please contact your primary care physician or go to the nearest emergency department.  Please call the clinic during office hours if you have any questions or concerns.   You may also contact the Patient Navigator at [phone] should you have any questions or need assistance in obtaining follow up care.      Resources For Cancer Patients and their Caregivers ? American [employer] Society: Can assist with transportation, wigs, general needs, runs Look Good Feel Better.        213-366-7234 ? Cancer Care: Provides financial assistance, online support groups, medication/co-pay assistance.   1-800-813-HOPE 540-112-0170) ? [facility] Assists [place] cancer patients and their families through emotional , educational and financial support.  732-141-9541 ? [place] DSS Where to apply for food stamps, Medicaid and utility assistance. 254 288 4260 ? [facility]: Transportation to medical appointments. (320)187-3621 ? Social Security Administration: May apply for disability if have a Stage IV cancer. 208-314-3643 539-246-5763 ? [employer], Disability and Transit Services: Assists with nutrition, care and transit needs. [phone]

## 2016-06-16 NOTE — Progress Notes (Unsigned)
Referral sent to Hospice. Records faxed on 5/22

## 2016-06-16 NOTE — Progress Notes (Signed)
[name], MD [place] [address]   CURRENT THERAPY: Docetaxel + Ramucirumab beginning on 11/07/2015    Adenocarcinoma of right lung [facility])   11/20/2014 Procedure    Right scalp skin biopsy      11/20/2014 Pathology Results    Adenocarcinoma of lung primary. PDL-1 NEGATIVE (0% tumor proportion score); EGFR mutation NEGATIVE; no evidence of ALK gene rearrangement detected by FISH; no reaarnagement of ROS1 gene detected.      11/29/2014 PET scan    Large hypermetabolic mass within the RLL with central necrosis. Hypermetabolic right suprahilar mass with postobstructive collapse of the RUL. Subcarinal, paratracheal, prevascular and R internal mammary nodal metastases. Small R adrenal gland nodule.      12/10/2014 - 03/25/2015 Chemotherapy    Carboplatin and pemetrexed 6 cycles      02/11/2015 PET scan    Partial metabolic response to therapy, with decrease in hypermetabolic right lung masses, postobstructive right upper lobe consolidation, thoracic lymphadenopathy and right adrenal metastasis. No new or progressive disease identified.      04/05/2015 PET scan    No significant change in hypermetabolic right lung masses and thoracic lymphadenopathy. No new or progressive disease identified.      04/15/2015 - 08/01/2015 Chemotherapy    Pemetrexed maintenance      08/14/2015 PET scan    Overall progression of metastatic lung cancer. Increasingly hypermetabolic mediastinal lymph nodes and R lung masses, enlarging R adrenal mass and new osseous lesions. Enlarging hypermetabolic R-sided scalp nodules.      08/14/2015 Progression    Progression of disease on PET imaging      08/29/2015 - 10/24/2015 Chemotherapy    Opdivo      08/29/2015 Miscellaneous    Xgeva started monthly      09/05/2015 Imaging    MRI brain- Multiple metastatic deposits in the brain. At least 9 lesions are identified. Mild edema around the 10 mm lesion in the right cerebellum.        09/27/2015 - 09/27/2015 Radiation Therapy    Dr. Lisbeth Renshaw- SRS to brain mets:  PTV target 1-10 were treated using 2 treatment plans. A single isocenter technique was utilized to treat 8 lesion simultaneously. PTV's 2 and 5 were then treated simultaneously with a second radiation treatment plan due to their close proximity. A prescription dose of 20 Gy was delivered to each lesion. ExacTrac Snap verification was performed for each couch angle.       10/31/2015 PET scan    1. Overall there has been interval progression of disease compared with the previous exam. 2. There is an increased FDG uptake associated with the dominant mass involving the right lower lobe. Additionally, there are new hypermetabolic pre-vascular and right posterior cervical lymph nodes. 3. Significant interval increase in FDG uptake associated with right upper lobe pulmonary nodule and right adrenal gland metastases. 4. New hypermetabolic metastasis is involving the left hip. Additionally, there is intense uptake associated with fractured right rib. Underlying lesion not excluded. 5. Increase in FDG uptake associated with lesion involving the posterior nasopharynx.      10/31/2015 Progression    Progression of disease      11/07/2015 Treatment Plan Change    D/C Opdivo and change to Docetaxel + Ramucirumab      11/07/2015 -  Chemotherapy    Docetaxel + Ramucirumab       01/06/2016 PET scan    1. Overall positive response to therapy  with reduction in size and metabolic activity of metastatic lesions. 2. Decreased in the peripheral metabolic activity of the large RIGHT lower lobe mass. 3. Decrease in metabolic activity of metastatic mediastinal lymph nodes. 4. Decrease in metabolic activity of RIGHT adrenal gland metastasis. 5. Decreased metabolic activity skeletal metastasis      01/15/2016 - 01/19/2016 Hospital Admission    Admit date: 01/15/2016 Admission diagnosis: Rectal bleeding Additional  comments: Colitis      01/15/2016 Imaging    CT abd/pelvis- 1. Bowel wall thickening involving the sigmoid colon and to a lesser extent descending colon concerning for colitis. 2. 5.4 cm right lower lobe pulmonary mass most consistent with primary lung malignancy. 3.  Aortic Atherosclerosis       03/13/2016 PET scan    1. Mixed response within the chest. The dominant right lower lobe mass demonstrates continued retraction with central necrosis, although shows mildly increased peripheral hypermetabolic activity medially. 2. Most of the mediastinal lymph nodes are unchanged, although there is new subcarinal and right cervical hypermetabolic adenopathy suspicious for progressive disease. 3. Increased metabolic activity within the right adrenal gland without enlarging nodule. 4. Persistent hypermetabolic activity in the left femoral neck, mildly increased compared with previous study. No new lesions are seen. 5. Persistent sigmoid colon wall thickening suspicious for colitis.      04/14/2016 Code Status    Limited CODE STATUS.  NO INTUBATION.  All other interventions requested.      04/22/2016 - 04/22/2016 Radiation Therapy    Dr. Lisbeth Renshaw in [place]-  Site/dose:  1) PTV11 Brainstem 83m: 14 Gy in 1 fraction 2) PTV12 Right Cerebellum 560m 20 Gy in 1 fraction 3) PTV13 Right Temporal 49m20m20 Gy in 1 fraction 4) PTV14 Left Frontal 4mm29m0 Gy in 1 fraction  Beams/energy:    1) SBRT/SRT-3D // 6FFF Photon 2) SBRT/SRT-3D // 6FFF Photon 3) SBRT/SRT-3D // 6FFF Photon 4) SBRT/SRT-3D // 6FFF Photon      05/14/2016 Imaging    Ct head- Negative CT. Tiny metastatic lesions demonstrated by MRI on 04/02/2016 are not visible utilizing CT. No statement can be made about whether these have been successfully treated or are simply not visible by this less sensitive technique.      05/15/2016 Imaging    CT CAP- Dominant 5.4 cm right lower lobe mass, corresponding to known pulmonary adenocarcinoma,  grossly unchanged. Additional satellite nodularity/ tumor in the right upper and lower lobes.  Thoracic nodal metastases, as above, grossly unchanged.  Suspected 1.7 cm right adrenal metastasis, slightly more conspicuous than on the prior.  Suspected sigmoid colitis. Small volume pelvic ascites. No free air.      05/19/2016 Treatment Plan Change    Treatment deferred due to infection of finger.      INTERVAL HISTORY: Carl [id number]. 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 [facility]atin/Pemetrexed every 21 days x 6 cycles (12/10/2014- 03/25/2015) with transition to maintenance Pemetrexed every 21 days with last dose being administered on 08/01/2015. Imaging demonstrated progression of disease, resulting in [place] 08[place]- 10/24/2015.  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 MoodLisbeth RenshawT scan on 10/31/2015 demonstrated progression of disease resulting in a change in therapy to Docetaxel and Ramucirumab beginning on 11/07/2015. He received  stereotactic radiosurgery to 4 brain lesions on 04/22/16.  Patient presents today  for continued follow-up with his wife and friend. He states that he has not been doing well for the past few weeks. He has had a dramatic decline in his functional status. He states that most of the time he's either in bed or just laying around on the sofa. He does not even want to watch TV anymore. His appetite has decreased. He is in chronic pain which is diffuse all over his body. He has his baseline shortness of breath. Denies any chest pain, headaches, nausea, vomiting. Patient and his wife are very tearful today while they were talking about how poorly he is feeling. He states he is tired of going to doctors visits, and did take a lot out of him every time he goes.  Review of Systems   Constitutional: Positive for malaise/fatigue and weight loss. Negative for fever.  HENT: Negative.   Eyes: Negative.   Respiratory: Positive for shortness of breath. Negative for cough.   Cardiovascular: Negative.  Negative for chest pain, palpitations and leg swelling.  Gastrointestinal: Negative.  Negative for abdominal pain and diarrhea.  Genitourinary: Negative.   Musculoskeletal: Positive for falls.  Skin: Negative.   Neurological: Negative.        Balance problems  Endo/Heme/Allergies: Negative.   Psychiatric/Behavioral: Negative.   All other systems reviewed and are negative.  Past Medical History:  Diagnosis Date  . Adenocarcinoma of right lung ([place]) 07/11/2015  . Bone metastasis ([place]) 08/24/2015  . Brain metastases ([place]) 09/18/2015  . Cirrhosis of liver ([place]) 08/14/2015   PET on 08/14/2015 demonstrates cirrhotic liver.   . Colitis 01/15/2016    Past Surgical History:  Procedure Laterality Date  . BIOPSY  03/09/2016   Procedure: BIOPSY;  Surgeon: Daneil Dolin, MD;  [facility];  Service: Endoscopy;;  sigmoid and rectal biopsies  . COLONOSCOPY WITH PROPOFOL N/A 03/09/2016   Procedure: COLONOSCOPY WITH PROPOFOL;  Surgeon: Daneil Dolin, MD;  Location: [facility];  Service: Endoscopy;  Laterality: N/A;  100 - pt knows to arrive at 7:45  . POLYPECTOMY  03/09/2016   Procedure: POLYPECTOMY;  Surgeon: Daneil Dolin, MD;  Location: [facility];  Service: Endoscopy;;  at splenic flexure x2; distal descending colon  . right power port placement Right    right subclavian    Family History  Problem Relation Age of Onset  . Alzheimer's disease Mother 84  . CAD Mother   . Diabetes Mother   . Cancer Father        thyroid cancer  . CVA Father 9  . Lupus Sister   . Cancer Sister        metastatic ovarian  . Colon cancer Neg Hx     Social History   Social History  . Marital status: Married    Spouse name: N/A  . Number of children: N/A  . Years of  education: N/A   Occupational History  . disabled    Social History Main Topics  . Smoking status: Current Every Day Smoker    Packs/day: 2.00  . Smokeless tobacco: Never Used  . Alcohol use No  . Drug use: No  . Sexual activity: Not Asked   Other Topics Concern  . None   Social History Narrative  . None    PHYSICAL EXAMINATION ECOG PERFORMANCE STATUS: 3 - Symptomatic, >50% confined to bed Physical Exam  Constitutional: He is oriented to person, place, and time and well-developed, well-nourished, and in no distress. No distress.  In  a wheelchair  HENT:  Head: Normocephalic and atraumatic.  Mouth/Throat: No oropharyngeal exudate.  Eyes: Conjunctivae and EOM are normal. Pupils are equal, round, and reactive to light. No scleral icterus.  Neck: Normal range of motion. Neck supple. No JVD present.  Cardiovascular: Normal rate, regular rhythm and normal heart sounds.  Exam reveals no gallop and no friction rub.   No murmur heard. Pulmonary/Chest: Effort normal and breath sounds normal. No respiratory distress. He has no wheezes. He has no rales.  Abdominal: Soft. Bowel sounds are normal. He exhibits no distension. There is no tenderness. There is no guarding.  Musculoskeletal: Normal range of motion. He exhibits no edema or tenderness.  3rd finger bandaged  Lymphadenopathy:    He has no cervical adenopathy.  Neurological: He is alert and oriented to person, place, and time. No cranial nerve deficit. Gait normal.  Skin: Skin is warm and dry. No rash noted. No erythema. No pallor.  Right scalp lesion covered with bandage. Right dorsal foot lesion covered with bandage.  Psychiatric: Affect and judgment normal.  Tearful  Nursing note and vitals reviewed.  LABORATORY DATA: CBC    Component Value Date/Time   WBC 11.2 (H) 06/16/2016 0959   RBC 3.77 (L) 06/16/2016 0959   HGB 12.8 (L) 06/16/2016 0959   HCT 38.4 (L) 06/16/2016 0959   PLT 233 06/16/2016 0959   MCV 101.9 (H)  06/16/2016 0959   MCH 34.0 06/16/2016 0959   MCHC 33.3 06/16/2016 0959   RDW 16.3 (H) 06/16/2016 0959   LYMPHSABS 1.2 06/16/2016 0959   MONOABS 0.4 06/16/2016 0959   EOSABS 0.0 06/16/2016 0959   BASOSABS 0.0 06/16/2016 0959   CMP Latest Ref Rng & Units 06/16/2016 05/26/2016 05/19/2016  Glucose 65 - 99 mg/dL 137(H) 180(H) 131(H)  BUN 6 - 20 mg/dL 11 7 7   Creatinine 0.61 - 1.24 mg/dL 0.68 0.53(L) 0.56(L)  Sodium 135 - 145 mmol/L 132(L) 133(L) 132(L)  Potassium 3.5 - 5.1 mmol/L 3.7 3.9 3.1(L)  Chloride 101 - 111 mmol/L 98(L) 103 98(L)  CO2 22 - 32 mmol/L 22 22 24   Calcium 8.9 - 10.3 mg/dL 7.6(L) 6.5(L) 5.9(LL)  Total Protein 6.5 - 8.1 g/dL 5.5(L) 5.8(L) 5.9(L)  Total Bilirubin 0.3 - 1.2 mg/dL 1.2 0.7 1.1  Alkaline Phos 38 - 126 U/L 111 99 100  AST 15 - 41 U/L 36 28 18  ALT 17 - 63 U/L 13(L) 15(L) 12(L)     Chemistry      Component Value Date/Time   NA 132 (L) 06/16/2016 0959   K 3.7 06/16/2016 0959   CL 98 (L) 06/16/2016 0959   CO2 22 06/16/2016 0959   BUN 11 06/16/2016 0959   CREATININE 0.68 06/16/2016 0959      Component Value Date/Time   CALCIUM 7.6 (L) 06/16/2016 0959   CALCIUM 5.9 (LL) 05/19/2016 1053   ALKPHOS 111 06/16/2016 0959   AST 36 06/16/2016 0959   ALT 13 (L) 06/16/2016 0959   BILITOT 1.2 06/16/2016 0959     RADIOGRAPHIC STUDIES: I have personally reviewed the radiological images as listed and agreed with the findings in the report.  MRI Brain 04/02/2016 IMPRESSION: 1. Three definite new small brain metastases, plus 1 additional highly suspicious dorsal cervicomedullary junction area of enhancement. No associated edema or mass effect. These are annotated with double arrows on series 10. 2. All previously treated brain metastases are diminutive or resolved; three remain faintly visible following contrast. 3. Resected right posterior scalp mass and resolved or  resected left nasopharyngeal lesion since August.  NUCLEAR MEDICINE PET SKULL BASE TO THIGH  03/13/2016 IMPRESSION: 1. Mixed response within the chest. The dominant right lower lobe mass demonstrates continued retraction with central necrosis, although shows mildly increased peripheral hypermetabolic activity medially. 2. Most of the mediastinal lymph nodes are unchanged, although there is new subcarinal and right cervical hypermetabolic adenopathy suspicious for progressive disease. 3. Increased metabolic activity within the right adrenal gland without enlarging nodule. 4. Persistent hypermetabolic activity in the left femoral neck, mildly increased compared with previous study. No new lesions are seen. 5. Persistent sigmoid colon wall thickening suspicious for colitis.  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 Treated by Dr. Tressie Stalker at [facility] with Carboplatin/Pemetrexed every 21 days x 6 cycles (12/10/2014- 03/25/2015) with transition to maintenance Pemetrexed every 21 days with last dose being administered on 08/01/2015. Imaging demonstrated progression of disease, resulting in a change in therapy to [place] beginning on 08/29/2015- 10/24/2015. 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 Lisbeth Renshaw. PET scan on 10/31/2015 demonstrated progression of disease resulting in a change in therapy to Docetaxel and Ramucirumab beginning on 11/07/2015.  Restaging PET scan on 03/13/2016 demonstrated a mixed response.  CT imaging on 05/15/2016 demonstrates stability of disease.  PLAN: I'm very concerned about patient's dramatic decline in his performance status. His performance status is 3 at this time. I have advised patient that I do not think he should proceed with today's chemotherapy cycle and have discussed changing plans to focus on comfort measures with home hospice. I have discussed in detail with them that his disease is incurable and all chemotherapy is palliative in nature. At this point  I believe that any more palliative chemotherapy will not be beneficial for the patient and may actually make him feel worse due to the side effects of chemotherapy. The patient was very adamant in continuing with today's chemotherapy with cycle 11 of docetaxel and ramucirumab. He is agreeable to home hospice after this cycle of chemotherapy is complete. Therefore I made a referral to home hospice for the patient. We'll discontinue all further treatment for patient after today. Patient is agreeable to this plan. I have discussed CODE STATUS with patient and he is now DO NOT RESUSCITATE. Return to clinic as needed, otherwise I will turn overall care to hospice.  Twana First, MD

## 2016-06-16 NOTE — Treatment Plan (Signed)
Ok to treat without urine protein today per dr Talbert Cage

## 2016-06-16 NOTE — Progress Notes (Signed)
Today, pt is being sent to home hospice, with change to DNR. He decided to undergo chemo one last time since he was already here.    He gets very tearful when discussing his taste changes, stating that the CT contrast he has taken for scans tastes better than most foods. RD stated that his taste changes should improve with cessation of chemo and this hopefully will allow him some comfort.   RD had planned on talking how to improved QOL of life at home by preventing dehydration. However, pt was understandably emotional and RD instead focused much on supportive listening.  No follow up anticipated  Burtis Junes RD, LDN, CNSC Clinical Nutrition Pager: [phone] 06/16/2016 11:18 AM

## 2016-06-16 NOTE — Progress Notes (Signed)
Per dr.Zhou ok to treat with chemo today. This will be last chemo treatment as she will be making Hospice referral. Patient unable to void. OK per Dr.Zhou to treat without urine specimen/test. Tolerated chemo well. Carl Simmons arrived today for [facility] neulasta on body injector. See MAR for administration details. Injector in place and engaged with green light indicator on flashing. Tolerated application with out problems. Stable on discharge home with family via wheelchair.

## 2016-06-16 NOTE — Telephone Encounter (Signed)
Created in error

## 2016-06-16 NOTE — Patient Instructions (Signed)
[facility] Discharge Instructions  RECOMMENDATIONS MADE BY THE CONSULTANT AND ANY TEST RESULTS WILL BE SENT TO YOUR REFERRING PHYSICIAN.  You were seen today by Dr. Louise Zhou    Thank you for choosing [facility] at [facility] to provide your oncology and hematology care.  To afford each patient quality time with our provider, please arrive at least 15 minutes before your scheduled appointment time.    If you have a lab appointment with the Cancer Center please come in thru the  Main Entrance and check in at the main information desk  You need to re-schedule your appointment should you arrive 10 or more minutes late.  We strive to give you quality time with our providers, and arriving late affects you and other patients whose appointments are after yours.  Also, if you no show three or more times for appointments you may be dismissed from the clinic at the providers discretion.     Again, thank you for choosing [facility].  Our hope is that these requests will decrease the amount of time that you wait before being seen by our physicians.       _____________________________________________________________  Should you have questions after your visit to [facility], please contact our office at [phone] between the hours of 8:30 a.m. and 4:30 p.m.  Voicemails left after 4:30 p.m. will not be returned until the following business day.  For prescription refill requests, have your pharmacy contact our office.       Resources For Cancer Patients and their Caregivers ? American Cancer Society: Can assist with transportation, wigs, general needs, runs Look Good Feel Better.        1-888-227-6333 ? Cancer Care: Provides financial assistance, online support groups, medication/co-pay assistance.  1-800-813-HOPE (4673) ? [facility] Assists [place] cancer patients and their  families through emotional , educational and financial support.  336-427-4357 ? [place] DSS Where to apply for food stamps, Medicaid and utility assistance. 336-342-1394 ? [facility]: Transportation to medical appointments. 336-347-2287 ? Social Security Administration: May apply for disability if have a Stage IV cancer. 336-342-7796 1-800-772-1213 ? [place] Aging, Disability and Transit Services: Assists with nutrition, care and transit needs. 336-349-2343  Cancer Center Support Programs: @10RELATIVEDAYS@ > Cancer Support Group  2nd Tuesday of the month 1pm-2pm, Journey Room  > Creative Journey  3rd Tuesday of the month 1130am-1pm, Journey Room  > Look Good Feel Better  1st Wednesday of the month 10am-12 noon, Journey Room (Call American Cancer Society to register 1-800-395-5775)    

## 2016-06-17 LAB — VITAMIN D 25 HYDROXY (VIT D DEFICIENCY, FRACTURES): Vit D, 25-Hydroxy: 12.7 ng/mL — ABNORMAL LOW (ref 30.0–100.0)

## 2016-06-20 ENCOUNTER — Other Ambulatory Visit (HOSPITAL_COMMUNITY): Payer: Self-pay | Admitting: Oncology

## 2016-06-20 DIAGNOSIS — C7951 Secondary malignant neoplasm of bone: Secondary | ICD-10-CM

## 2016-06-20 DIAGNOSIS — C3491 Malignant neoplasm of unspecified part of right bronchus or lung: Secondary | ICD-10-CM

## 2016-07-07 ENCOUNTER — Other Ambulatory Visit (HOSPITAL_COMMUNITY): Payer: Self-pay | Admitting: Oncology

## 2016-07-07 ENCOUNTER — Ambulatory Visit (HOSPITAL_COMMUNITY): Payer: BLUE CROSS/BLUE SHIELD

## 2016-07-07 ENCOUNTER — Other Ambulatory Visit: Payer: Self-pay | Admitting: *Deleted

## 2016-07-07 DIAGNOSIS — C7931 Secondary malignant neoplasm of brain: Secondary | ICD-10-CM

## 2016-07-07 DIAGNOSIS — C7949 Secondary malignant neoplasm of other parts of nervous system: Principal | ICD-10-CM

## 2016-07-13 ENCOUNTER — Encounter: Payer: Self-pay | Admitting: Radiation Therapy

## 2016-07-13 NOTE — Progress Notes (Signed)
Pt is now under Hospice Care. MRI and Follow-up appointments cancelled. No future Rad Onc appointments scheduled.   Mont Dutton R.T.(R)(T) Special Procedures Navigator

## 2016-07-26 DEATH — deceased

## 2016-07-27 ENCOUNTER — Ambulatory Visit: Payer: Self-pay | Admitting: Radiation Oncology

## 2016-07-27 ENCOUNTER — Encounter: Payer: Self-pay | Admitting: Radiation Therapy

## 2016-07-28 ENCOUNTER — Ambulatory Visit (HOSPITAL_COMMUNITY): Payer: BLUE CROSS/BLUE SHIELD

## 2017-04-30 IMAGING — MR MR HEAD WO/W CM
11 series · 47 of 48 positions shown · IV contrast (20 ml multihance)
Comparison: Post treatment scan 12/26/2015, and earlier.

CLINICAL DATA: 55-year-old male with non-small cell, adenocarcinoma
right lung metastatic to brain. The patient was treated with SRS to
10 lesions in September 2015. Subsequent encounter.

EXAM:
MRI HEAD WITHOUT AND WITH CONTRAST
TECHNIQUE: Multiplanar, multiecho pulse sequences of the brain and surrounding
structures were obtained without and with intravenous contrast.
CONTRAST:  20mL MULTIHANCE GADOBENATE DIMEGLUMINE 529 MG/ML IV SOLN

[Series 2: FLAIR · sagittal · 3.0mm · 0.75mm/px · 2 of 39 slices shown (1 of 2)]
[im 1/39]
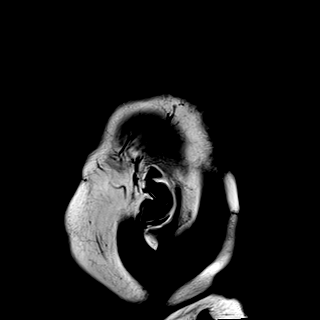
[im 39/39]
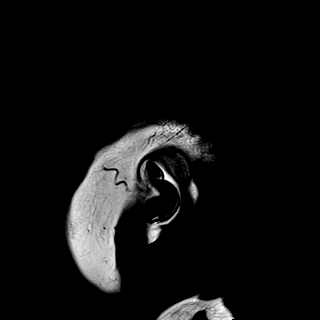

[Series 3: DWI · axial · 3.0mm · 1.50mm/px · z∈[-73,+75]mm · 5 of 78 slices shown (1 of 2)]
[im 1/78]
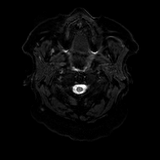
[im 20/78]
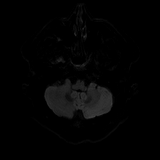
[im 39/78]
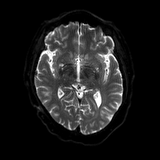
[im 58/78]
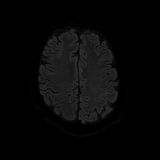
[im 78/78]
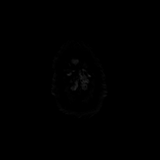

[Series 4: DWI · axial · 3.0mm · 1.50mm/px · z∈[-73,+75]mm · 2 of 36 slices shown (2 of 2)]
[im 1/36]
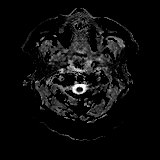
[im 36/36]
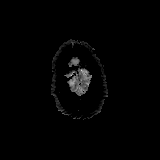

[Series 5: T2 · axial · 5.0mm · 0.57mm/px · z∈[-65,+90]mm · 2 of 27 slices shown]
[im 1/27]
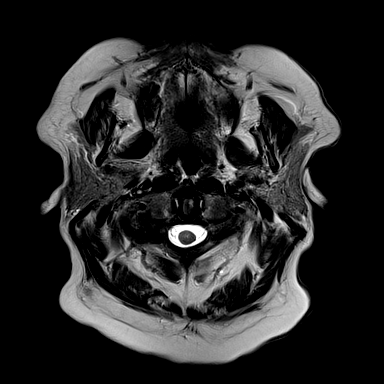
[im 27/27]
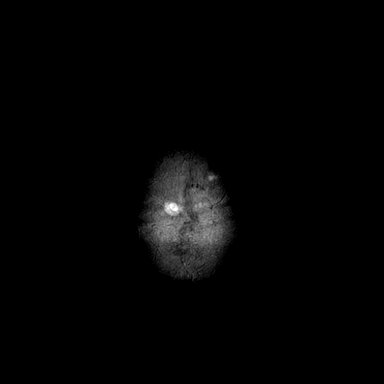

[Series 6: GRE · axial · 5.0mm · 0.57mm/px · z∈[-65,+90]mm · 2 of 27 slices shown]
[im 1/27]
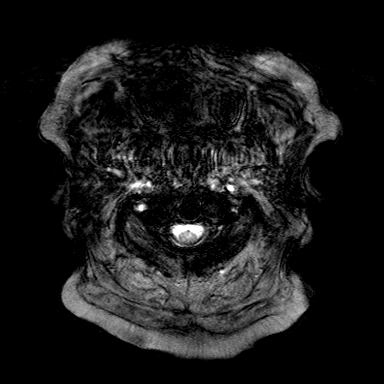
[im 27/27]
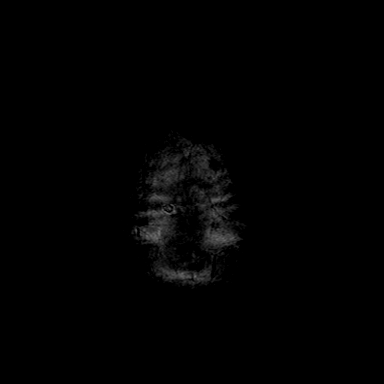

[Series 7: FLAIR · axial · 3.0mm · 0.57mm/px · z∈[-71,+82]mm · 4 of 52 slices shown (2 of 2)]
[im 1/52]
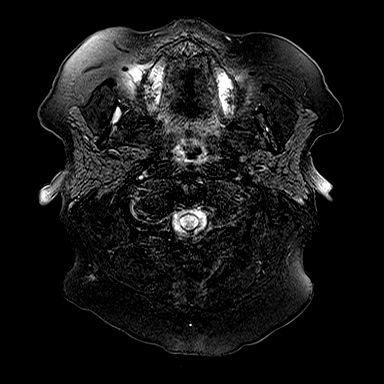
[im 18/52]
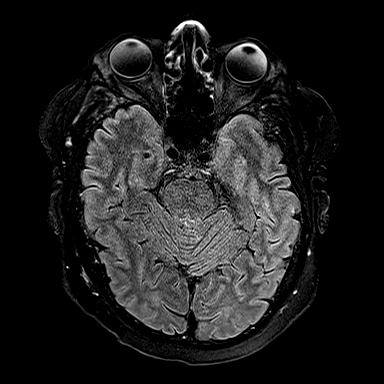
[im 35/52]
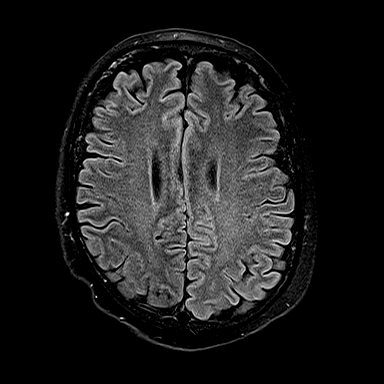
[im 52/52]
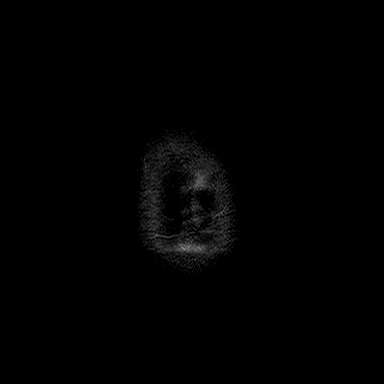

[Series 8: T1 · axial · 1.0mm · 0.75mm/px · z∈[-74,+85]mm · 10 of 160 slices shown]
[im 1/160]
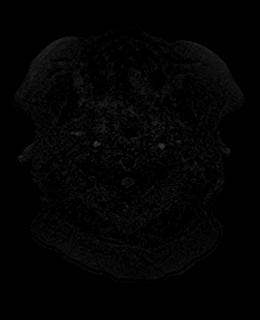
[im 16/160]
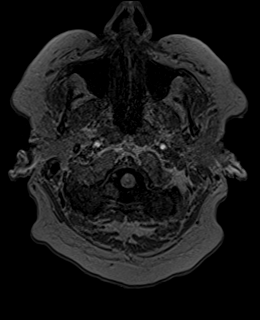
[im 32/160]
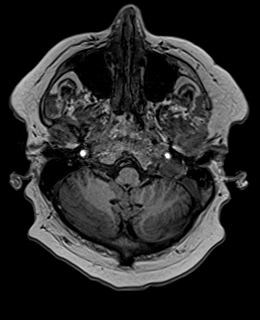
[im 48/160]
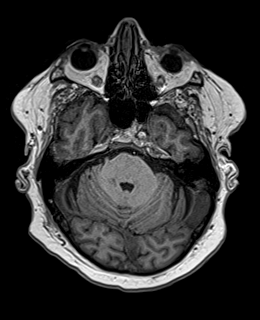
[im 64/160]
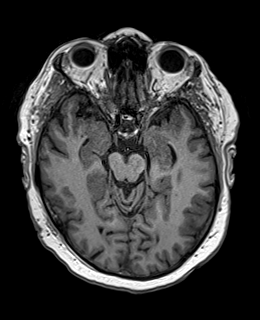
[im 80/160]
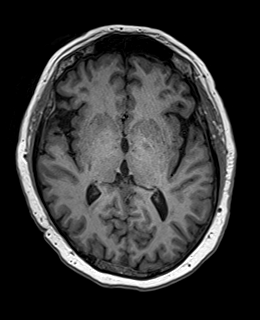
[im 96/160]
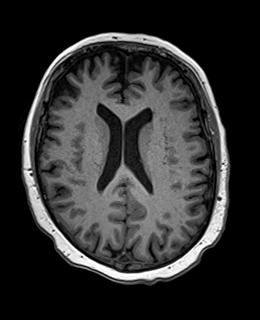
[im 112/160]
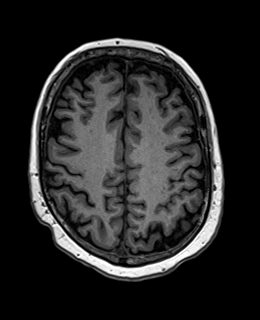
[im 128/160]
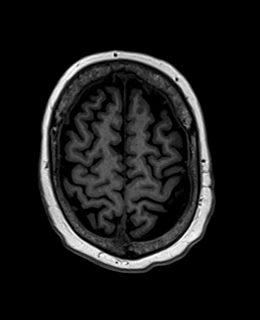
[im 160/160]
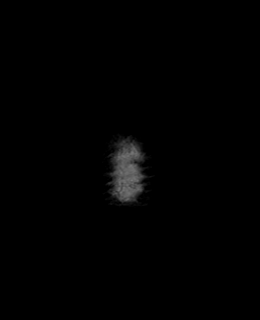

[Series 9: T2 post-contrast · coronal · 3.0mm · 0.57mm/px · 3 of 47 slices shown]
[im 1/47]
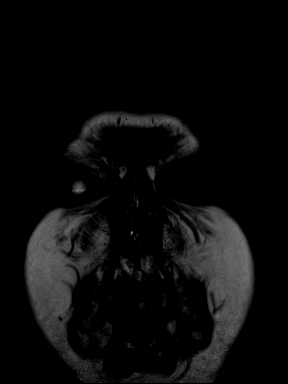
[im 24/47]
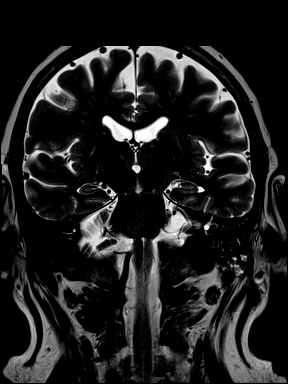
[im 47/47]
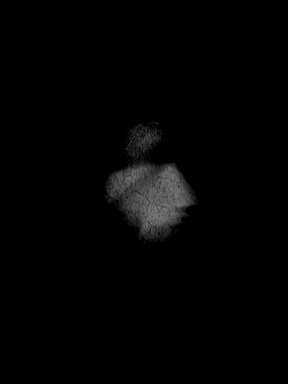

[Series 10: T1 post-contrast · axial · 1.0mm · 0.75mm/px · z∈[-74,+85]mm · 11 of 160 slices shown (1 of 2)]
[im 1/160]
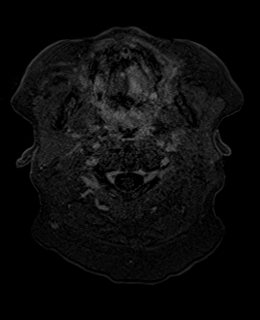
[im 16/160]
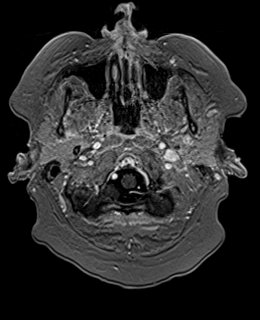
[im 32/160]
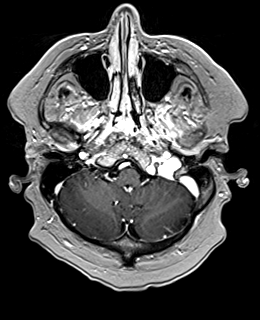
[im 48/160]
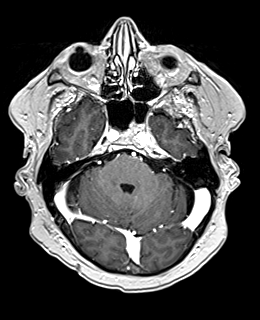
[im 64/160]
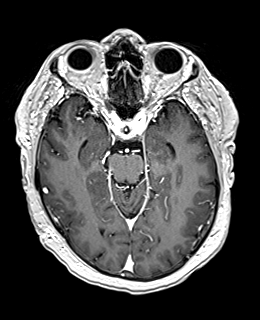
[im 80/160]
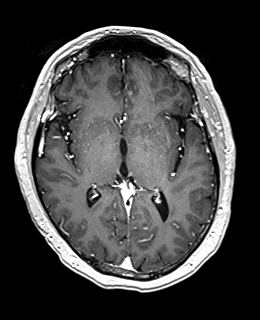
[im 96/160]
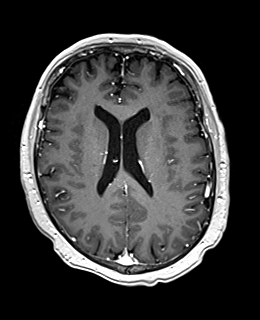
[im 112/160]
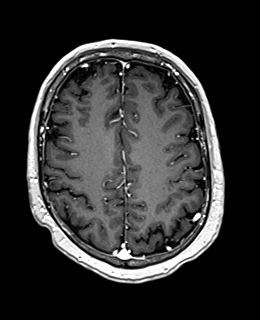
[im 128/160]
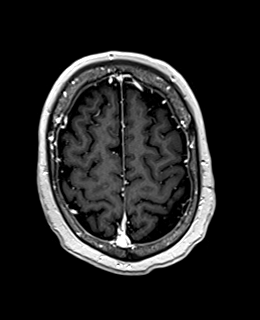
[im 144/160]
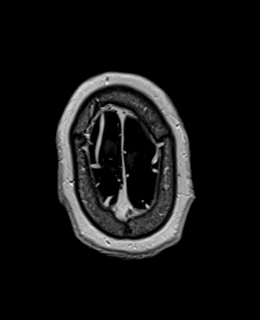
[im 160/160]
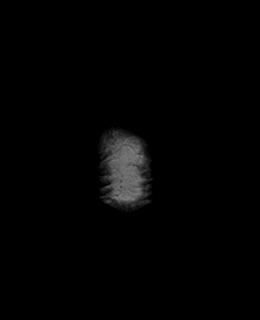

[Series 11: T1 post-contrast · coronal · 3.0mm · 0.57mm/px · 3 of 47 slices shown (2 of 2)]
[im 1/47]
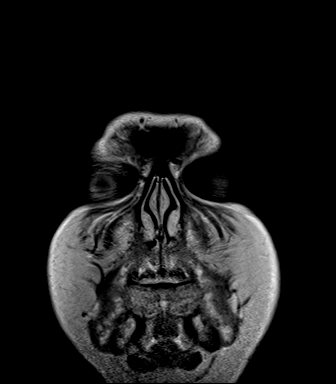
[im 24/47]
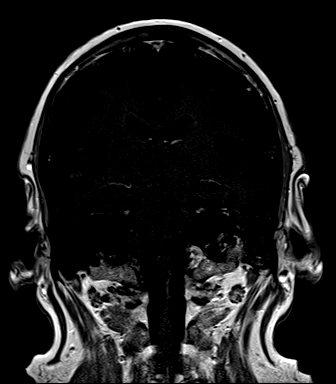
[im 47/47]
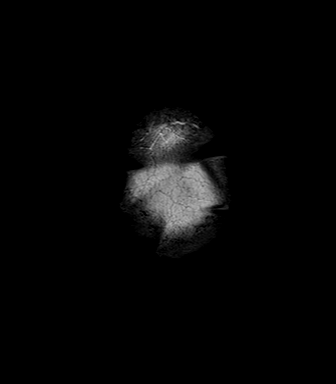

[Series 12: FLAIR post-contrast · sagittal · 3.0mm · 0.75mm/px · 3 of 39 slices shown]
[im 1/39]
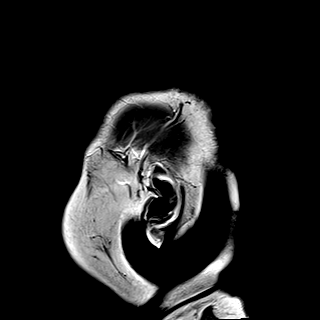
[im 20/39]
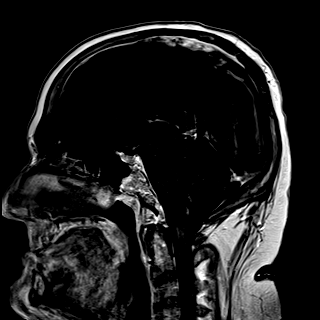
[im 39/39]
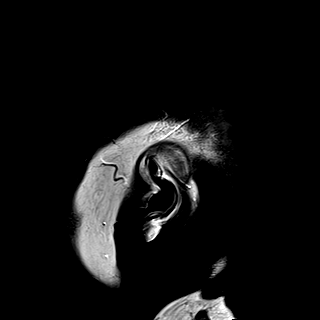

[47 of 48 positions shown; findings below may reference images not displayed]

FINDINGS: Brain: Most of the 10 treated metastases as seen in August 2015 are
no longer visible following contrast (annotated with circles on
series 10).

Two of the cerebellar and the right inferior frontal gyrus treated
metastases remain faintly visible (on series 10, images 26 images
26, 49, and 78).

The mild brain edema previously associated with the treated lesions
is resolved.

There are three small new brain metastases identified along the
anterior right superior cerebellar surface on series 10, image 54,
in the right temporal lobe on series 10, image 56, and in the medial
anterior left frontal lobe on series 10, image 79. None of these
have associated edema.

Additionally, there is a sutble increasing area of enhancement at
the dorsal cervicomedullary junction (series 10, image 21 and series
12, image 20) which seems to be brand new compared to a 11/26/2014
[facility] brain MRI and is highly suspicious for
small metastasis. No associated dorsal brainstem or upper spinal
cord edema with this lesion.

No dural thickening or leptomeningeal disease identified. No
restricted diffusion to suggest acute infarction. No midline shift,
mass effect, ventriculomegaly, extra-axial collection or acute
intracranial hemorrhage. Cervicomedullary junction and pituitary are
within normal limits. No chronic cerebral blood products. Stable
small right cerebellar and medial right thalamic lacunar infarcts.

Vascular: Major intracranial vascular flow voids are stable.

Skull and upper cervical spine: Except for the dorsal
cervicomedullary junction lesion described above the visible
cervical spine and spinal cord are normal. Bone marrow signal is
chronically heterogeneous but stable.

Sinuses/Orbits: Orbits soft tissues are stable and within normal
limits. Stable paranasal sinuses and mastoids, mild left mastoid
effusion. Grossly normal visualized internal auditory structures.

Other: Resected right occipital region scalp mass. Regressed
nasopharyngeal soft tissue mass or cyst since [REDACTED].
IMPRESSION: 1. Three definite new small brain metastases, plus 1 additional
highly suspicious dorsal cervicomedullary junction area of
enhancement. No associated edema or mass effect. These are annotated
with double arrows on series [DATE]. All previously treated brain metastases are diminutive or
resolved; three remain faintly visible following contrast.
3. Resected right posterior scalp mass and resolved or resected left
nasopharyngeal lesion since [DATE].
